# Patient Record
Sex: Male | Born: 1945 | Race: Black or African American | Hispanic: No | Marital: Single | Smoking: Current every day smoker
Health system: Southern US, Community
[De-identification: ages and names within clinical notes are randomized; demographics above are authoritative.]

## PROBLEM LIST (undated history)

## (undated) DIAGNOSIS — I10 Essential (primary) hypertension: Secondary | ICD-10-CM

## (undated) DIAGNOSIS — F32A Depression, unspecified: Secondary | ICD-10-CM

## (undated) DIAGNOSIS — F329 Major depressive disorder, single episode, unspecified: Secondary | ICD-10-CM

## (undated) DIAGNOSIS — E785 Hyperlipidemia, unspecified: Secondary | ICD-10-CM

## (undated) DIAGNOSIS — M109 Gout, unspecified: Secondary | ICD-10-CM

## (undated) HISTORY — DX: Depression, unspecified: F32.A

---
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
## 2006-01-09 ENCOUNTER — Emergency Department: Payer: Self-pay | Admitting: Emergency Medicine

## 2009-06-26 ENCOUNTER — Emergency Department: Payer: Self-pay | Admitting: Unknown Physician Specialty

## 2012-02-08 ENCOUNTER — Inpatient Hospital Stay: Payer: Self-pay | Admitting: Internal Medicine

## 2012-02-08 LAB — CBC
HCT: 35.4 % — ABNORMAL LOW (ref 40.0–52.0)
HGB: 11.8 g/dL — ABNORMAL LOW (ref 13.0–18.0)
MCHC: 33.4 g/dL (ref 32.0–36.0)
WBC: 12 10*3/uL — ABNORMAL HIGH (ref 3.8–10.6)

## 2012-02-08 LAB — CK: CK, Total: 2186 U/L — ABNORMAL HIGH (ref 35–232)

## 2012-02-08 LAB — PRO B NATRIURETIC PEPTIDE: B-Type Natriuretic Peptide: 425 pg/mL — ABNORMAL HIGH (ref 0–125)

## 2012-02-08 LAB — COMPREHENSIVE METABOLIC PANEL
Albumin: 2.7 g/dL — ABNORMAL LOW (ref 3.4–5.0)
Anion Gap: 7 (ref 7–16)
Bilirubin,Total: 1.5 mg/dL — ABNORMAL HIGH (ref 0.2–1.0)
Calcium, Total: 8.5 mg/dL (ref 8.5–10.1)
Chloride: 100 mmol/L (ref 98–107)
Co2: 32 mmol/L (ref 21–32)
EGFR (African American): >60
Glucose: 113 mg/dL — ABNORMAL HIGH (ref 65–99)
Osmolality: 279 (ref 275–301)
Potassium: 3.1 mmol/L — ABNORMAL LOW (ref 3.5–5.1)
SGOT(AST): 64 U/L — ABNORMAL HIGH (ref 15–37)

## 2012-02-09 LAB — CBC WITH DIFFERENTIAL/PLATELET
Basophil #: 0.1 10*3/uL (ref 0.0–0.1)
Basophil %: 0.7 %
Eosinophil #: 0 10*3/uL (ref 0.0–0.7)
HCT: 31.7 % — ABNORMAL LOW (ref 40.0–52.0)
Lymphocyte #: 1.6 10*3/uL (ref 1.0–3.6)
MCH: 29 pg (ref 26.0–34.0)
MCHC: 33.8 g/dL (ref 32.0–36.0)
MCV: 86 fL (ref 80–100)
Monocyte #: 1.6 x10 3/mm — ABNORMAL HIGH (ref 0.2–1.0)
Neutrophil #: 8.3 10*3/uL — ABNORMAL HIGH (ref 1.4–6.5)
RBC: 3.7 10*6/uL — ABNORMAL LOW (ref 4.40–5.90)
RDW: 13.8 % (ref 11.5–14.5)

## 2012-02-09 LAB — BASIC METABOLIC PANEL
Anion Gap: 8 (ref 7–16)
BUN: 11 mg/dL (ref 7–18)
Chloride: 104 mmol/L (ref 98–107)
Co2: 28 mmol/L (ref 21–32)
Creatinine: 0.92 mg/dL (ref 0.60–1.30)
EGFR (African American): >60
Sodium: 140 mmol/L (ref 136–145)

## 2012-02-09 LAB — URINALYSIS, COMPLETE
Bilirubin,UR: NEGATIVE
Hyaline Cast: 1
Ketone: NEGATIVE
Ph: 6 (ref 4.5–8.0)
Protein: 30
RBC,UR: 2 /HPF (ref 0–5)
Specific Gravity: 1.02 (ref 1.003–1.030)
Squamous Epithelial: <1
WBC UR: 1 /HPF (ref 0–5)

## 2012-02-10 LAB — CBC WITH DIFFERENTIAL/PLATELET
Basophil #: 0 10*3/uL (ref 0.0–0.1)
Basophil %: 0.3 %
Eosinophil #: 0 10*3/uL (ref 0.0–0.7)
HGB: 11.5 g/dL — ABNORMAL LOW (ref 13.0–18.0)
Lymphocyte #: 0.9 10*3/uL — ABNORMAL LOW (ref 1.0–3.6)
Lymphocyte %: 9.2 %
MCHC: 36.3 g/dL — ABNORMAL HIGH (ref 32.0–36.0)
Monocyte #: 0.7 x10 3/mm (ref 0.2–1.0)
Neutrophil %: 83.2 %
Platelet: 410 10*3/uL (ref 150–440)
RBC: 3.68 10*6/uL — ABNORMAL LOW (ref 4.40–5.90)

## 2012-02-10 LAB — BASIC METABOLIC PANEL
Anion Gap: 7 (ref 7–16)
Chloride: 105 mmol/L (ref 98–107)
Co2: 27 mmol/L (ref 21–32)
Creatinine: 0.89 mg/dL (ref 0.60–1.30)
EGFR (Non-African Amer.): >60
Glucose: 124 mg/dL — ABNORMAL HIGH (ref 65–99)
Osmolality: 279 (ref 275–301)

## 2012-02-14 LAB — CULTURE, BLOOD (SINGLE)

## 2014-06-10 NOTE — Consult Note (Signed)
Brief Consult Note: Diagnosis: gout left foot.   Patient was seen by consultant.   Consult note dictated.   Recommend to proceed with surgery or procedure.   Recommend further assessment or treatment.   Comments: Gout likely in left Sub talar joint.  I proceeded with injectiion to left STJ with 4mg  dwxamethasone and 20mg  triamcinalone mixed with marcaine. Sterile prep was usred.  Will check tomorrorw and see if pt is better.  May wasnt to consider a medrol dosepak for further coverage.  would recommend another uric after acute attack is over.  May need higher dose of allopurinol for reduced serum uric acid.  Electronic Signatures: Perry Mount (MD)  (Signed 19-Dec-13 14:29)  Authored: Brief Consult Note   Last Updated: 19-Dec-13 14:29 by Perry Mount (MD)

## 2014-06-10 NOTE — H&P (Signed)
PATIENT NAME:  Marc Smith, Marc Smith MR#:  [id number] DATE OF BIRTH:  Jan 21, 1946  DATE OF ADMISSION:  02/08/2012  CHIEF COMPLAINT: Bilateral lower extremity pain, left more than right, along with swelling of the left foot and inability to walk.   HISTORY OF PRESENTING ILLNESS: The patient is a 69 year old male patient with history of gout, hypertension, hyperlipidemia and questionable prior strokes, presents to the Emergency Room brought in by the sister with complaints of extreme weakness in the legs, inability to walk with left foot swelling and pain. The patient has had these symptoms for about three days. He has not had any fever. He complains of some dry cough which seems to be chronic, nonproductive. No shortness of breath, nausea, vomiting, abdominal pain, rash. No recent change in medications.   The patient in the Emergency Room has been found to have normal uric acid level, elevated white count of 12.1, with heart rate elevated to 110. He was given a dose of Ancef and is being admitted to the hospitalist service with left foot cellulitis, unable to walk. He has a left foot callus which is extremely painful. At baseline he walks with a walker and presently is unable to walk.   PAST MEDICAL HISTORY: Hypertension, gout, hyperlipidemia, possible stroke in the past with no residual symptoms, tobacco abuse.   SOCIAL HISTORY: The patient lives at home with sister. He walks with a walker. He drinks 1 to 2 drinks once or twice a week. He smokes a pack a day.   FAMILY HISTORY: Diabetes and hypertension in the family with leukemia in his father.   HOME MEDICATIONS: 1. Allopurinol 100 mg oral once a day.  2. Amlodipine 10 mg oral once a day.  3. Coreg 25 mg oral twice a day.  4. Hydrochlorothiazide 12.5 mg oral once a day.  5. Lisinopril 40 mg oral once a day.  6. Oxybutynin 5 mg oral 2 times a day.  7. Simvastatin 20 mg oral once a day.    REVIEW OF SYSTEMS:  CONSTITUTIONAL: Complains of fatigue and  weakness. No weight loss, weight gain.  EYES: No blurred vision, pain, or redness.  ENT: No tinnitus, ear pain, hearing loss.  RESPIRATORY: Has a chronic dry cough, no sputum. No shortness of breath, asthma, or chronic obstructive pulmonary disease.  CARDIOVASCULAR: No chest pain, orthopnea, edema or arrhythmias.  GASTROINTESTINAL: No nausea, vomiting, diarrhea, abdominal pain, hematemesis, melena.  GENITOURINARY: Has polyuria and frequency. No dysuria, hematuria or incontinence.  ENDOCRINE: Has polyuria and nocturia. No thyroid problems or increased sweating. HEMATOLOGIC/LYMPHATIC: No anemia, easy bruising, bleeding.  INTEGUMENTARY: Has some redness in the left foot with warmth. No rash or ulcers.  MUSCULOSKELETAL: Has extreme weakness in his lower extremities with left ankle and foot pain.  NEUROLOGICAL: Lower extremity weakness. No dysarthria or numbness.  PSYCHIATRIC: No anxiety or depression.   ALLERGIES: No known drug allergies.   PHYSICAL EXAMINATION: VITAL SIGNS: Temperature 98.1, pulse 110, respirations 18, blood pressure 180/93, saturating 98% on room air.  GENERAL: An obese African American male patient lying in bed, comfortable, drowsy.  PSYCHIATRIC: Alert, oriented to person, place, and time, conversational, poor judgment, depressed affect.  HEENT: Atraumatic, normocephalic. Oral mucosa dry and pink. External ears and nose normal. No pallor. No icterus. Pupils bilaterally are equal and reactive to light.  NECK: Supple. No thyromegaly. No palpable lymph nodes. Trachea midline. No carotid bruits or JVD.  CARDIOVASCULAR: S1, S2. Regular rate and rhythm without any murmurs. No edema.  RESPIRATORY: Normal  work of breathing. Clear to auscultation on both sides.  GASTROINTESTINAL: Soft abdomen, nontender. Bowel sounds present. No hepatosplenomegaly palpable.  SKIN: Warm and dry. No petechiae, rash, or ulcers.  MUSCULOSKELETAL: He has swelling of the left foot with warmth and  tenderness. No other joint swelling, redness noticed. Normal muscle tone.  NEUROLOGICAL: Motor strength 4 out of 5 in bilateral lower extremities. Cranial nerves II through XII are intact. Sensation to fine touch intact all over.   LABORATORY AND RADIOLOGICAL DATA:  Lab studies show glucose of 113, BNP 425, BUN 14, creatinine 1.11, potassium of 3.1, albumin 2.7. WBC 12, hemoglobin 11.8, platelets of 409.   EKG shows heart rate of 105, sinus tachycardia with left ventricular hypertrophy and repolarization changes. No acute ST-T wave changes.   ASSESSMENT AND PLAN: 1. Left foot cellulitis: We will start the patient on clindamycin. No risks of any multiresistant organisms. He has elevated white count, tachycardia and weakness. We will get blood cultures and follow.  2. Dehydration: Start on IV fluids. Could be secondary to decreased intake.  3. Weakness, inability to walk: Consult physical therapy. We will check a CK level to look for any rhabdomyolysis. I do not suspect a stroke in this patient considering the symmetrical weakness in lower extremities, and the patient does seem to have some baseline weakness in the lower extremities and uses a walker. This could be secondary to his cellulitis, infection.  4. Uncontrolled hypertension: Continue medications. I will hold hydrochlorothiazide secondary to his history of gout. We will start him on hydralazine in addition to his Coreg, amlodipine and lisinopril.  5. Hypokalemia: Likely secondary from the hydrochlorothiazide, decreased intake. We will replace and add potassium to his IV fluids. We will recheck in the morning.  6. Gout: The patient's uric acid level is normal. His left foot cellulitis might actually be gout. I will start him on ketorolac for pain, and the NSAIDs should help his gout. He needs to be followed.  7. Tobacco abuse- Patient counselled for > 3 minutes to quit smoking. He seems to have poor understanding and judgement. 8. Deep vein  prophylaxis with Lovenox.   CODE STATUS:  FULL CODE.     TIME SPENT: Time spent today on this case was 50 minutes with more than 50% time spent in coordination of care.   ____________________________ Leia Alf. Ashira Kirsten, MD srs:cb D: 02/08/2012 15:30:01 ET T: 02/08/2012 16:25:55 ET JOB#: [id number]  cc: Alveta Heimlich R. Yancy Knoble, MD, <Dictator> Neita Carp MD ELECTRONICALLY SIGNED 02/08/2012 17:00

## 2014-06-10 NOTE — Consult Note (Signed)
Brief Consult Note: Diagnosis: bilateral knee Baker's cyst, gout.   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 Hold off on injection with current infection. Rec antigout medication for knees.  Electronic Signatures: Laurene Footman (MD)  (Signed 19-Dec-13 18:04)  Authored: Brief Consult Note   Last Updated: 19-Dec-13 18:04 by Laurene Footman (MD)

## 2014-06-10 NOTE — Discharge Summary (Signed)
PATIENT NAME:  Marc Smith, Marc Smith MR#:  [id number] DATE OF BIRTH:  10-15-1945  DATE OF ADMISSION:  02/08/2012 DATE OF DISCHARGE:  02/11/2012   CONSULTATIONS:  1.  Podiatry, Gerrit Heck Troxler, DPM. 2.  Orthopedic surgery, Laurene Footman, MD.   DISCHARGE DIAGNOSES:  1.  Gout flare.  2.  Metabolic encephalopathy.  3.  Hypertension.  4.  Fossa cyst.   PROCEDURE: TJ.   CONDITION: Stable.   CODE STATUS: Full code.   MEDICATIONS:  1.  Oxybutynin 5 mg p.o. b.i.d. . 2.  Lisinopril 40 mg p.o. daily. 3.  Coreg 25 mg p.o. b.i.d.  4.  Norvasc 10 mg p.o. daily.  5.  Allopurinol 100 mg 2 tablets once a day.  6.  Lipitor 20 mg p.o. at bedtime  MEDICATION TO STOP:   1.  Zocor 20 mg p.o. at bedtime. 2.  Hydrochlorothiazide 12.5 mg p.o. daily.   DIET: Low sodium diet.   ACTIVITY: As tolerated.   FOLLOWUP:   1.  Followup with PCP within 1 to 2 weeks.  2.  Follow up with Dr. Elvina Mattes p.r.n.   REASON FOR ADMISSION: Bilateral lower extremity pain, left more than right, along with swelling of left foot and inability to walk.  HOSPITAL COURSE: The patient is a 69 year old African American male with a history of gout, hypertension, hyperlipidemia who presented to the ED with bilateral lower extremity weakness, pain, inability to walk.  For detailed history and physical examination, please refer to the admission note dictated by Dr. Darvin Neighbours. Laboratory data on admission date showed normal uric acid level. WBC 20.1. He was admitted for possible left of cellulitis, dehydration, weakness. After admission, the patient was treated with clindamycin for possible left foot cellulitis. We asked for a podiatry consult. Dr. Elvina Mattes evaluated the patient and suggested the patient had a gout flare.  He did an STJ injection, with 4 mg dexamethasone and 20 mg triamcinolone, mixed with Marcaine.  After treatment, the patient's symptoms have much improved. He had no complaints of pain.  In addition, the patient's  bilateral foot x-ray showed no acute bone disease. The patient had physical therapy and now he can walk with assistance. The patient's symptoms have much improved after injection treatment.  The patient has no sign of cellulitis, so clindamycin was discontinued. Dr. Elvina Mattes suggested he increase the allopurinol dose to 200 mg p.o. daily.   For weakness and inability to walk the patient underwent physical therapy.   Rhabdomyolysis. The patient's CK is elevated, so the patient has been treated with IV fluid and the CK level went down.  Uncontrolled hypertension. The patient has been treated with Coreg, amlodipine and lisinopril.  Hydrochlorothiazide was discontinued due to gout flare.   Patient also has hypokalemia, so hydrochlorothiazide was discontinued and the patient was treated with IV fluid.   Altered mental status has improved after treatment. He is alert, awake.  He is clinically stable and will be discharged to go home today with a rolling walker.       Discussed the patient's discharge plan with the patient, the patient's sister and the case manager.   TIME SPENT: About 38 minutes    ____________________________ Demetrios Loll, MD qc:eg D: 02/11/2012 14:44:01 ET T: 02/12/2012 20:46:04 ET JOB#: [id number]  cc: Demetrios Loll, MD, <Dictator> Demetrios Loll MD ELECTRONICALLY SIGNED 02/13/2012 16:48

## 2014-06-13 NOTE — Consult Note (Signed)
PATIENT NAME:  Marc Smith, Marc Smith MR#:  [id number] DATE OF BIRTH:  Dec 17, 1945  DATE OF CONSULTATION:  02/09/2012  REFERRING PHYSICIAN:   CONSULTING PHYSICIAN:  Marc Smith, DPM  HISTORY OF PRESENT ILLNESS: The patient was admitted yesterday to the hospital for swelling and pain particularly to his left foot and ankle region. He states that this pain has been going on since last Sunday and was starting to run a little fever and has had a good bit of pain and discomfort in the area. His right is a little sore also, but the left is notably worse. He has no history of injury to this that he is aware of.  PAST MEDICAL HISTORY: Positive for hyperlipidemia, possible stroke in the past with no residual symptoms, hypertension and gout. He states that he has had fluid drawn off of his joint before and tested for gout crystals and it was positive.  SOCIAL HISTORY: Tobacco abuse, EtOH periodically and smokes 1 pack per day.  FAMILY HISTORY: Diabetes, hypertension and his father had leukemia.  ALLERGIES: No known drug allergies.  CURRENT HOME MEDICATIONS: 1. Allopurinol 100 mg once a day. 2. Amlodipine 10 mg once a day. 3. Coreg 25 mg twice a day. 4. Hydrochlorothiazide 12.5 mg orally once a day. 5. Lisinopril 40 mg once a day.  6. Oxybutynin 5 mg twice a day. 7. Simvastatin 20 mg once a day.   PHYSICAL EXAMINATION:  VITAL SIGNS: Temperature is 99.1. He has run one of 100.6 earlier today and right now is 99.1. Pulse is 103, respirations 18, blood pressure 146/82 and pulse oxygen is 99%.   GENERAL: He is alert, fairly well oriented. Only distress is the pain in his left foot and ankle region. He is being followed by [facility] for his medical care.   LOWER EXTREMITY EXAM: Vascular - DP pulses are +1/4 bilaterally.   DERMATOLOGIC: Skin texture is within normal limits. The patient has sort of a painful sub-met 5 lesion callus on the left foot that appears to be hyperkeratotic in nature. He also  has Gryphotic, thickened and deformed toenails to his _____ which he does not take very good care of.   ORTHO: The patient has swelling, erythema and increased inflammation to the left foot as a whole. The right foot is not anything like this. There are inflammatory changes with a lot of tenderness to palpation to the anterior ankle, but even more so to the lateral subtalar joints, lateral sinus tarsi region.   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   CLINICAL IMPRESSION: I think this is likely gout even though his uric acid was high-normal. If he had a lot of it deposited in his joint at the time it would not reflect a high hyperuricemia. I think that it is something that would also make his white count go up some and cause him to have low-grade fevers as well, if he were having an acute attack. His uric acid was 6.7, but I think it is very reasonable that he does have a gout attack going on. Number one he has history of it and number two he takes hydrochlorothiazide daily and his allopurinol level is only 100 mg daily. BUN is 14 and his creatinine is 1.11. Clinical impression is likely gout attack, acute phase, in his left subtalar joint, more than likely.   TREATMENT PLAN: I went over different options.  I think what I would like to do is maybe try and give him a sinus tarsi injection of Marcaine and triamcinolone to see if that does not knock out a good bit of the discomfort, pain and irritation he has. I think that will probably help him out a good bit. He may need some Medrol Dosepak in order to knock out more of the inflammation as a whole in both feet. Would also recommend that once he has a normal appearance to his foot that we consider doing another serum uric acid at that juncture and see what his level is at that         point and if it is high recommend we go ahead and  increase his dosage of allopurinol. Since it is high-normal at this point, I think it is reasonable consideration anyway to try to get down to the lower levels to reduce risk of gout attacks.  ____________________________ Marc Heck Shalicia Craghead, DPM mgt:sb D: 02/09/2012 13:37:34 ET T: 02/09/2012 14:04:17 ET JOB#: [id number]  cc: Marc Heck Aster Eckrich, DPM, <Dictator> Perry Mount MD ELECTRONICALLY SIGNED 04/17/2012 14:28

## 2016-11-20 ENCOUNTER — Emergency Department: Payer: Medicare Other

## 2016-11-20 ENCOUNTER — Inpatient Hospital Stay
Admission: EM | Admit: 2016-11-20 | Discharge: 2016-11-23 | DRG: 689 | Disposition: A | Discharge status: Home / Self Care | Payer: Medicare Other | Attending: Internal Medicine | Admitting: Internal Medicine

## 2016-11-20 DIAGNOSIS — B9620 Unspecified Escherichia coli [E. coli] as the cause of diseases classified elsewhere: Secondary | ICD-10-CM | POA: Diagnosis present

## 2016-11-20 DIAGNOSIS — M109 Gout, unspecified: Secondary | ICD-10-CM | POA: Diagnosis present

## 2016-11-20 DIAGNOSIS — I10 Essential (primary) hypertension: Secondary | ICD-10-CM | POA: Diagnosis present

## 2016-11-20 DIAGNOSIS — N3000 Acute cystitis without hematuria: Secondary | ICD-10-CM | POA: Diagnosis not present

## 2016-11-20 DIAGNOSIS — G9341 Metabolic encephalopathy: Secondary | ICD-10-CM | POA: Diagnosis present

## 2016-11-20 DIAGNOSIS — E860 Dehydration: Secondary | ICD-10-CM | POA: Diagnosis present

## 2016-11-20 DIAGNOSIS — N390 Urinary tract infection, site not specified: Secondary | ICD-10-CM | POA: Diagnosis present

## 2016-11-20 DIAGNOSIS — N179 Acute kidney failure, unspecified: Secondary | ICD-10-CM | POA: Diagnosis present

## 2016-11-20 DIAGNOSIS — E876 Hypokalemia: Secondary | ICD-10-CM | POA: Diagnosis present

## 2016-11-20 DIAGNOSIS — E785 Hyperlipidemia, unspecified: Secondary | ICD-10-CM | POA: Diagnosis present

## 2016-11-20 DIAGNOSIS — F17210 Nicotine dependence, cigarettes, uncomplicated: Secondary | ICD-10-CM | POA: Diagnosis present

## 2016-11-20 DIAGNOSIS — R4182 Altered mental status, unspecified: Secondary | ICD-10-CM

## 2016-11-20 HISTORY — DX: Hyperlipidemia, unspecified: E78.5

## 2016-11-20 HISTORY — DX: Gout, unspecified: M10.9

## 2016-11-20 HISTORY — DX: Essential (primary) hypertension: I10

## 2016-11-20 LAB — HEPATIC FUNCTION PANEL
ALBUMIN: 2.7 g/dL — AB (ref 3.5–5.0)
ALT: 12 U/L — ABNORMAL LOW (ref 17–63)
AST: 25 U/L (ref 15–41)
Alkaline Phosphatase: 57 U/L (ref 38–126)
BILIRUBIN TOTAL: 1.8 mg/dL — AB (ref 0.3–1.2)
Bilirubin, Direct: 0.6 mg/dL — ABNORMAL HIGH (ref 0.1–0.5)
Indirect Bilirubin: 1.2 mg/dL — ABNORMAL HIGH (ref 0.3–0.9)
TOTAL PROTEIN: 7.5 g/dL (ref 6.5–8.1)

## 2016-11-20 LAB — TROPONIN I

## 2016-11-20 LAB — BASIC METABOLIC PANEL
Anion gap: 13 (ref 5–15)
BUN: 24 mg/dL — ABNORMAL HIGH (ref 6–20)
CO2: 27 mmol/L (ref 22–32)
CREATININE: 2.02 mg/dL — AB (ref 0.61–1.24)
Calcium: 8.1 mg/dL — ABNORMAL LOW (ref 8.9–10.3)
Chloride: 96 mmol/L — ABNORMAL LOW (ref 101–111)
GFR calc Af Amer: 37 mL/min — ABNORMAL LOW (ref >60)
GFR, EST NON AFRICAN AMERICAN: 32 mL/min — AB (ref >60)
Glucose, Bld: 150 mg/dL — ABNORMAL HIGH (ref 65–99)
Potassium: 3.1 mmol/L — ABNORMAL LOW (ref 3.5–5.1)
SODIUM: 136 mmol/L (ref 135–145)

## 2016-11-20 LAB — URINALYSIS, COMPLETE (UACMP) WITH MICROSCOPIC
BILIRUBIN URINE: NEGATIVE
Glucose, UA: NEGATIVE mg/dL
KETONES UR: NEGATIVE mg/dL
Nitrite: NEGATIVE
Protein, ur: 100 mg/dL — AB
Specific Gravity, Urine: 1.017 (ref 1.005–1.030)
pH: 5 (ref 5.0–8.0)

## 2016-11-20 LAB — CBC WITH DIFFERENTIAL/PLATELET
BASOS ABS: 0 10*3/uL (ref 0–0.1)
Basophils Relative: 0 %
EOS PCT: 0 %
Eosinophils Absolute: 0 10*3/uL (ref 0–0.7)
HCT: 33.8 % — ABNORMAL LOW (ref 40.0–52.0)
HEMOGLOBIN: 11.6 g/dL — AB (ref 13.0–18.0)
Lymphocytes Relative: 10 %
Lymphs Abs: 1.3 10*3/uL (ref 1.0–3.6)
MCH: 28.7 pg (ref 26.0–34.0)
MCHC: 34.3 g/dL (ref 32.0–36.0)
MCV: 83.5 fL (ref 80.0–100.0)
Monocytes Absolute: 1.8 10*3/uL — ABNORMAL HIGH (ref 0.2–1.0)
Monocytes Relative: 14 %
NEUTROS PCT: 76 %
Neutro Abs: 9.6 10*3/uL — ABNORMAL HIGH (ref 1.4–6.5)
PLATELETS: 319 10*3/uL (ref 150–440)
RBC: 4.04 MIL/uL — AB (ref 4.40–5.90)
RDW: 14.8 % — ABNORMAL HIGH (ref 11.5–14.5)
WBC: 12.7 10*3/uL — AB (ref 3.8–10.6)

## 2016-11-20 LAB — PREALBUMIN

## 2016-11-20 LAB — AMMONIA: AMMONIA: 43 umol/L — AB (ref 9–35)

## 2016-11-20 MED ORDER — CEFTRIAXONE SODIUM IN DEXTROSE 20 MG/ML IV SOLN
1.0000 g | Freq: Once | INTRAVENOUS | Status: AC
  Administered 2016-11-20: 1 g via INTRAVENOUS
  Filled 2016-11-20: qty 50

## 2016-11-20 MED ORDER — ONDANSETRON HCL 4 MG/2ML IJ SOLN: 4.0000 mg | Freq: Four times a day (QID) | INTRAMUSCULAR | Status: DC | PRN

## 2016-11-20 MED ORDER — DEXTROSE 5 % IV SOLN
1.0000 g | INTRAVENOUS | Status: DC
  Administered 2016-11-21 – 2016-11-22 (×2): 1 g via INTRAVENOUS
  Filled 2016-11-20 (×3): qty 10

## 2016-11-20 MED ORDER — SODIUM CHLORIDE 0.9 % IV SOLN
INTRAVENOUS | Status: DC
  Administered 2016-11-20 – 2016-11-23 (×4): via INTRAVENOUS

## 2016-11-20 MED ORDER — ACETAMINOPHEN 650 MG RE SUPP: 650.0000 mg | Freq: Four times a day (QID) | RECTAL | Status: DC | PRN

## 2016-11-20 MED ORDER — POLYETHYLENE GLYCOL 3350 17 G PO PACK: 17.0000 g | PACK | Freq: Every day | ORAL | Status: DC | PRN

## 2016-11-20 MED ORDER — ACETAMINOPHEN 325 MG PO TABS: 650.0000 mg | ORAL_TABLET | Freq: Four times a day (QID) | ORAL | Status: DC | PRN

## 2016-11-20 MED ORDER — ENOXAPARIN SODIUM 40 MG/0.4ML ~~LOC~~ SOLN
40.0000 mg | SUBCUTANEOUS | Status: DC
  Administered 2016-11-20 – 2016-11-23 (×3): 40 mg via SUBCUTANEOUS
  Filled 2016-11-20 (×3): qty 0.4

## 2016-11-20 MED ORDER — AMLODIPINE BESYLATE 5 MG PO TABS
5.0000 mg | ORAL_TABLET | Freq: Every day | ORAL | Status: DC
  Administered 2016-11-21: 5 mg via ORAL
  Filled 2016-11-20: qty 1

## 2016-11-20 MED ORDER — CARVEDILOL 3.125 MG PO TABS
3.1250 mg | ORAL_TABLET | Freq: Two times a day (BID) | ORAL | Status: DC
  Administered 2016-11-21 (×2): 3.125 mg via ORAL
  Filled 2016-11-20 (×2): qty 1

## 2016-11-20 MED ORDER — SODIUM CHLORIDE 0.9 % IV BOLUS (SEPSIS)
1000.0000 mL | Freq: Once | INTRAVENOUS | Status: AC
  Administered 2016-11-20: 1000 mL via INTRAVENOUS

## 2016-11-20 MED ORDER — ONDANSETRON HCL 4 MG PO TABS: 4.0000 mg | ORAL_TABLET | Freq: Four times a day (QID) | ORAL | Status: DC | PRN

## 2016-11-20 MED ORDER — POTASSIUM CHLORIDE CRYS ER 20 MEQ PO TBCR
40.0000 meq | EXTENDED_RELEASE_TABLET | Freq: Once | ORAL | Status: AC
  Administered 2016-11-20: 40 meq via ORAL
  Filled 2016-11-20: qty 2

## 2016-11-20 MED ORDER — ATORVASTATIN CALCIUM 20 MG PO TABS
40.0000 mg | ORAL_TABLET | Freq: Every day | ORAL | Status: DC
  Administered 2016-11-21 – 2016-11-22 (×2): 40 mg via ORAL
  Filled 2016-11-20 (×2): qty 2

## 2016-11-20 MED ORDER — ALLOPURINOL 100 MG PO TABS
50.0000 mg | ORAL_TABLET | Freq: Every day | ORAL | Status: DC
  Administered 2016-11-21: 50 mg via ORAL
  Filled 2016-11-20: qty 0.5

## 2016-11-20 NOTE — H&P (Signed)
PCP:   Lavera Guise, MD   Chief Complaint:  Decreased level of consciousness  HPI: This is a 71 year old male who lives at home with his niece. Today at approximately 2 PM he was sitting on the table eating dinner, his nephew came home and notes he was slumped over and difficult to arouse. His eyes were glassy and he was limp. He has some slurred speech. He was concerned and called 911.. Prior to this the patient was in his usual health, no fevers, no chills, no nausea, vomiting or diarrhea. He did complain of foot pain. He does have gout. History provided in a by the family present at bedside. His nephew Wilfred Lacy [phone].  Review of Systems:  The patient denies anorexia, fever, weight loss,, vision loss, decreased hearing, hoarseness, chest pain, syncope, dyspnea on exertion, peripheral edema, balance deficits, hemoptysis, abdominal pain, melena, hematochezia, severe indigestion/heartburn, hematuria, incontinence, genital sores, muscle weakness, suspicious skin lesions, transient blindness, difficulty walking, depression, unusual weight change, abnormal bleeding, enlarged lymph nodes, angioedema, and breast masses. lethargic  Past Medical History: No past medical history on file.: Hypertension, gout, dyslipidemia No past surgical history on file.: None  Medications: Prior to Admission medications   Allopurinol, atorvastatin, lisinopril, Norvasc, Coreg, colchicine     Allergies:  No Known Allergies  Social History: Positive for tobacco, negative for alcohol or illicit drugs  Family History: Coronary artery disease  Physical Exam: Vitals:   11/20/16 2045 11/20/16 2100 11/20/16 2115 11/20/16 2130  BP:  113/64  118/69  Pulse: 63 74 75 78  Resp: 18 (!) 23 (!) 25 20  Temp:      TempSrc:      SpO2: 98% 100% 100% 94%  Weight:      Height:        General:  Alert and oriented But lethargic, well developed and nourished, no acute distress Eyes: PERRLA, pink conjunctiva, no  scleral icterus ENT: Moist oral mucosa, neck supple, no thyromegaly Lungs: clear to ascultation, no wheeze, no crackles, no use of accessory muscles Cardiovascular: regular rate and rhythm, no regurgitation, no gallops, no murmurs. No carotid bruits, no JVD Abdomen: soft, positive BS, non-tender, non-distended, no organomegaly, not an acute abdomen GU: not examined Neuro: CN II - XII grossly intact, sensation intact Musculoskeletal: strength 5/5 all extremities, no clubbing, cyanosis or edema Skin: no rash, no subcutaneous crepitation, no decubitus Psych: Lethargic patient   Labs on Admission:   Recent Labs  11/20/16 1752  NA 136  K 3.1*  CL 96*  CO2 27  GLUCOSE 150*  BUN 24*  CREATININE 2.02*  CALCIUM 8.1*    Recent Labs  11/20/16 1752  AST 25  ALT 12*  ALKPHOS 57  BILITOT 1.8*  PROT 7.5  ALBUMIN 2.7*   No results for input(s): LIPASE, AMYLASE in the last 72 hours.  Recent Labs  11/20/16 1752  WBC 12.7*  NEUTROABS 9.6*  HGB 11.6*  HCT 33.8*  MCV 83.5  PLT 319    Recent Labs  11/20/16 1752  TROPONINI <0.03   Invalid input(s): POCBNP No results for input(s): DDIMER in the last 72 hours. No results for input(s): HGBA1C in the last 72 hours. No results for input(s): CHOL, HDL, LDLCALC, TRIG, CHOLHDL, LDLDIRECT in the last 72 hours. No results for input(s): TSH, T4TOTAL, T3FREE, THYROIDAB in the last 72 hours.  Invalid input(s): FREET3 No results for input(s): VITAMINB12, FOLATE, FERRITIN, TIBC, IRON, RETICCTPCT in the last 72 hours.  Micro Results: No results  found for this or any previous visit (from the past 240 hour(s)).   Radiological Exams on Admission: Ct Head Wo Contrast  Result Date: 11/20/2016 CLINICAL DATA:  Weakness, altered mental status and hypotension EXAM: CT HEAD WITHOUT CONTRAST TECHNIQUE: Contiguous axial images were obtained from the base of the skull through the vertex without intravenous contrast. COMPARISON:  None. FINDINGS:  Brain: No mass lesion, intraparenchymal hemorrhage or extra-axial collection. No evidence of acute cortical infarct. There is periventricular hypoattenuation compatible with chronic microvascular disease. Vascular: No hyperdense vessel or unexpected calcification. Skull: Normal visualized skull base, calvarium and extracranial soft tissues. Sinuses/Orbits: No sinus fluid levels or advanced mucosal thickening. No mastoid effusion. Normal orbits. 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 No acute abnormality. Electronically Signed   By: Ulyses Jarred M.D.   On: 11/20/2016 19:02   Dg Chest Port 1 View  Result Date: 11/20/2016 CLINICAL DATA:  Hypotension. EXAM: PORTABLE CHEST 1 VIEW COMPARISON:  02/08/2012. FINDINGS: Stable elevation of the left hemidiaphragm. Clear lungs. Normal sized heart. Unremarkable bones. IMPRESSION: No acute abnormality.  Stable elevated left hemidiaphragm. Electronically Signed   By: Claudie Revering M.D.   On: 11/20/2016 18:50   Dg Foot Complete Left  Result Date: 11/20/2016 CLINICAL DATA:  Gout. EXAM: LEFT FOOT - COMPLETE 3+ VIEW COMPARISON:  Radiographs of February 09, 2012. FINDINGS: There is no evidence of fracture or dislocation. Moderate hallux valgus deformity of the first metatarsophalangeal joint is noted. Vascular calcifications are noted. IMPRESSION: No acute abnormality seen in the left foot. Electronically Signed   By: Marijo Conception, M.D.   On: 11/20/2016 18:52    Assessment/Plan Present on Admission: . UTI (urinary tract infection) -Admit to MedSurg -Blood and urine cultures ordered -IV antibiotics Rocephin ordered  Hypokalemia -Replete in IV fluids  Acute kidney injury -IV fluid replacement. Repeat BMP in a.m. -Will hold ACE inhibitor  . Gout -Stable, home is resumed -Of note the family does not know what dosages medications the patient is on, they will bring the bottles in the morning -Will hold ACE  inhibitor  HTN -Stable, home is resumed  Dyslipidemia -Stable, home medications resumed   Marc Smith 11/20/2016, 11:14 PM

## 2016-11-20 NOTE — ED Provider Notes (Signed)
[facility] Emergency Department Provider Note  ____________________________________________   First MD Initiated Contact with Patient 11/20/16 1743     (approximate)  I have reviewed the triage vital signs and the nursing notes.   HISTORY  Chief Complaint No chief complaint on file.  level V exemption history Limited by the patient's clinical condition   HPI Marc Smith is a 71 y.o. male who comes to the emergency department via EMS after family called 911 for reported lethargy. The patient's only known past medical history of gout and at some point today the family noted that he had not been eating or drinking and not behaving normally they called 911. Further history is unable to be obtained as the patient is unable to speak.   ----------------------------------------- 6:16 PM on 11/20/2016 -----------------------------------------  Family is now at bedside able to provide some collateral history. They said the patient was in his usual state of health until about 2 days ago when he began to have a gouty flare to his left ankle. Yesterday he began to become weak and had difficulty ambulating although was conversant and appeared well. Today in the morning he appeared mostly well however after lunch the patient was no longer verbal and difficult to arouse. His nephew said "I think he is in a coma".   No past medical history on file.  There are no active problems to display for this patient.   No past surgical history on file.  Prior to Admission medications   Not on File    Allergies Patient has no known allergies.  No family history on file.  Social History Social History  Substance Use Topics  . Smoking status: Not on file  . Smokeless tobacco: Not on file  . Alcohol use Not on file    Review of Systems level V exemption history Limited by the patient's clinical  condition  ____________________________________________   PHYSICAL EXAM:  VITAL SIGNS: ED Triage Vitals  Enc Vitals Group     BP      Pulse      Resp      Temp      Temp src      SpO2      Weight      Height      Head Circumference      Peak Flow      Pain Score      Pain Loc      Pain Edu?      Excl. in [place]?     Constitutional: no acute distress. Obtunded but responsive to sternal rub and painful stimulus Eyes: PERRL EOMI. he pulls midrange and brisk Head: Atraumatic. Nose: No congestion/rhinnorhea. Mouth/Throat: No trismus Neck: No stridor.   Cardiovascular: Normal rate, regular rhythm. Grossly normal heart sounds.  Good peripheral circulation. Respiratory: Normal respiratory effort.  No retractions. Lungs CTAB and moving good air Gastrointestinal: soft nontender incontinent of feces Musculoskeletal: left foot swollen and slightly erythematous consistent with gout and not cellulitis no bulla blisters sloughing other signs of necrotizing soft tissue infection   Neurologic:  Normal speech and language. No gross focal neurologic deficits are appreciated. Skin:  Skin is warm, dry and intact. No rash noted. Psychiatric: Mood and affect are normal. Speech and behavior are normal.    ____________________________________________   DIFFERENTIAL includes but not limited to  metabolic arrangement, encephalopathy, sepsis, urinary tract infection, hyperammonemia, intracerebral hemorrhage ____________________________________________   LABS (all labs ordered are listed, but only abnormal results  are displayed)  Labs Reviewed  BASIC METABOLIC PANEL - Abnormal; Notable for the following:       Result Value   Potassium 3.1 (*)    Chloride 96 (*)    Glucose, Bld 150 (*)    BUN 24 (*)    Creatinine, Ser 2.02 (*)    Calcium 8.1 (*)    GFR calc non Af Amer 32 (*)    GFR calc Af Amer 37 (*)    All other components within normal limits  HEPATIC FUNCTION PANEL - Abnormal;  Notable for the following:    Albumin 2.7 (*)    ALT 12 (*)    Total Bilirubin 1.8 (*)    Bilirubin, Direct 0.6 (*)    Indirect Bilirubin 1.2 (*)    All other components within normal limits  CBC WITH DIFFERENTIAL/PLATELET - Abnormal; Notable for the following:    WBC 12.7 (*)    RBC 4.04 (*)    Hemoglobin 11.6 (*)    HCT 33.8 (*)    RDW 14.8 (*)    Neutro Abs 9.6 (*)    Monocytes Absolute 1.8 (*)    All other components within normal limits  URINALYSIS, COMPLETE (UACMP) WITH MICROSCOPIC - Abnormal; Notable for the following:    Color, Urine AMBER (*)    APPearance HAZY (*)    Hgb urine dipstick MODERATE (*)    Protein, ur 100 (*)    Leukocytes, UA TRACE (*)    Bacteria, UA MANY (*)    Squamous Epithelial / LPF 0-5 (*)    All other components within normal limits  AMMONIA - Abnormal; Notable for the following:    Ammonia 43 (*)    All other components within normal limits  URINE CULTURE  TROPONIN I  PREALBUMIN  AMMONIA    blood work reviewed and interpreted by me is consistent with urinary tract infection. Low albumin is either secondary to cirrhosis versus poor nutritional status __________________________________________  EKG   ____________________________________________  RADIOLOGY  chest x-ray reviewed by me shows no acute disease Foot x-ray reviewed by me shows no acute disease Head CT reviewed by me shows chronic changes but no acute disease ____________________________________________   PROCEDURES  Procedure(s) performed: no  Procedures  Critical Care performed: no  Observation: no ____________________________________________   INITIAL IMPRESSION / ASSESSMENT AND PLAN / ED COURSE  Pertinent labs & imaging results that were available during my care of the patient were reviewed by me and considered in my medical decision making (see chart for details).  Differential on the diaper dependent elderly dementia patient who is newly altered is  extremely broad but includes intracerebral pathology as well as infectious etiology. Head CT labs as well as urinalysis are all pending.     ----------------------------------------- 6:34 PM on 11/20/2016 -----------------------------------------  The patient's albumin just came back at 2.7 raising concern for possible undiagnosed cirrhosis. This could all be hepatic encephalopathy so we will check an ammonia now.____________________________________________   The patient's urinalysis was back in consistent with infection. I'll send a culture but she also requires intravenous antibiotics inpatient admission given his profound altered mental status.  FINAL CLINICAL IMPRESSION(S) / ED DIAGNOSES  Final diagnoses:  Acute cystitis without hematuria  Altered mental status, unspecified altered mental status type      NEW MEDICATIONS STARTED DURING THIS VISIT:  New Prescriptions   No medications on file     Note:  This document was prepared using Dragon voice recognition software and may include  unintentional dictation errors.     Darel Hong, MD 11/20/16 2159

## 2016-11-20 NOTE — ED Notes (Signed)
Pt in CT.

## 2016-11-20 NOTE — ED Triage Notes (Signed)
Pt presents to ED from home via EMS c/o weakness and low BP , systolic 52'C. Per EMS , gave 500 ml bag of sodium chloride

## 2016-11-21 LAB — URINE DRUG SCREEN, QUALITATIVE (ARMC ONLY)
AMPHETAMINES, UR SCREEN: NOT DETECTED
Barbiturates, Ur Screen: NOT DETECTED
Benzodiazepine, Ur Scrn: NOT DETECTED
COCAINE METABOLITE, UR ~~LOC~~: NOT DETECTED
Cannabinoid 50 Ng, Ur ~~LOC~~: NOT DETECTED
MDMA (ECSTASY) UR SCREEN: NOT DETECTED
Methadone Scn, Ur: NOT DETECTED
OPIATE, UR SCREEN: NOT DETECTED
PHENCYCLIDINE (PCP) UR S: NOT DETECTED
Tricyclic, Ur Screen: NOT DETECTED

## 2016-11-21 LAB — BASIC METABOLIC PANEL
ANION GAP: 12 (ref 5–15)
BUN: 33 mg/dL — ABNORMAL HIGH (ref 6–20)
CHLORIDE: 98 mmol/L — AB (ref 101–111)
CO2: 27 mmol/L (ref 22–32)
CREATININE: 1.84 mg/dL — AB (ref 0.61–1.24)
Calcium: 8.4 mg/dL — ABNORMAL LOW (ref 8.9–10.3)
GFR calc non Af Amer: 35 mL/min — ABNORMAL LOW (ref >60)
GFR, EST AFRICAN AMERICAN: 41 mL/min — AB (ref >60)
Glucose, Bld: 133 mg/dL — ABNORMAL HIGH (ref 65–99)
Potassium: 3.1 mmol/L — ABNORMAL LOW (ref 3.5–5.1)
Sodium: 137 mmol/L (ref 135–145)

## 2016-11-21 LAB — MAGNESIUM: Magnesium: 2.1 mg/dL (ref 1.7–2.4)

## 2016-11-21 LAB — CBC
HEMATOCRIT: 35.1 % — AB (ref 40.0–52.0)
HEMOGLOBIN: 11.9 g/dL — AB (ref 13.0–18.0)
MCH: 28.1 pg (ref 26.0–34.0)
MCHC: 33.8 g/dL (ref 32.0–36.0)
MCV: 83.2 fL (ref 80.0–100.0)
Platelets: 313 10*3/uL (ref 150–440)
RBC: 4.21 MIL/uL — AB (ref 4.40–5.90)
RDW: 14.6 % — ABNORMAL HIGH (ref 11.5–14.5)
WBC: 12.4 10*3/uL — ABNORMAL HIGH (ref 3.8–10.6)

## 2016-11-21 MED ORDER — ALLOPURINOL 100 MG PO TABS
100.0000 mg | ORAL_TABLET | Freq: Every day | ORAL | Status: DC
  Administered 2016-11-22 – 2016-11-23 (×2): 100 mg via ORAL
  Filled 2016-11-21 (×2): qty 1

## 2016-11-21 MED ORDER — AMLODIPINE BESYLATE 10 MG PO TABS: 10.0000 mg | ORAL_TABLET | Freq: Every day | ORAL | Status: DC

## 2016-11-21 MED ORDER — POTASSIUM CHLORIDE CRYS ER 20 MEQ PO TBCR
20.0000 meq | EXTENDED_RELEASE_TABLET | Freq: Once | ORAL | Status: AC
  Administered 2016-11-21: 20 meq via ORAL
  Filled 2016-11-21: qty 1

## 2016-11-21 NOTE — Progress Notes (Signed)
Spoke with patient's nephew to clarify history, etc. Patient at baseline is A&Ox4. Ambulates

## 2016-11-21 NOTE — Care Management (Signed)
Patient admitted from home after slumping over at the dinner table.  Admitted with UTI and acute renal failure.  Receiving IVFs, IV antibiotics.  Cultures thus far are negative.  Ammonia level slightly elevated.  Lives with his nephew and at baseline is independent I all his adls.

## 2016-11-21 NOTE — Progress Notes (Addendum)
[facility] NAME: Marc Smith    MR#:  [id number]  DATE OF BIRTH:  [birth year]  SUBJECTIVE:  CHIEF COMPLAINT:  No chief complaint on file. waking up and seem to be mentating fine, somewhat slow in response REVIEW OF SYSTEMS:  Review of Systems  Constitutional: Negative for chills, fever and weight loss.  HENT: Negative for nosebleeds and sore throat.   Eyes: Negative for blurred vision.  Respiratory: Negative for cough, shortness of breath and wheezing.   Cardiovascular: Negative for chest pain, orthopnea, leg swelling and PND.  Gastrointestinal: Negative for abdominal pain, constipation, diarrhea, heartburn, nausea and vomiting.  Genitourinary: Negative for dysuria and urgency.  Musculoskeletal: Negative for back pain.  Skin: Negative for rash.  Neurological: Negative for dizziness, speech change, focal weakness and headaches.  Endo/Heme/Allergies: Does not bruise/bleed easily.  Psychiatric/Behavioral: Negative for depression.    DRUG ALLERGIES:  No Known Allergies VITALS:  Blood pressure 131/76, pulse 63, temperature 97.7 F (36.5 C), temperature source Axillary, resp. rate 20, height 6\' 6"  (1.981 m), weight 90.1 kg (198 lb 9.6 oz), SpO2 98 %. PHYSICAL EXAMINATION:  Physical Exam  Constitutional: He is oriented to person, place, and time and well-developed, well-nourished, and in no distress.  HENT:  Head: Normocephalic and atraumatic.  Eyes: Pupils are equal, round, and reactive to light. Conjunctivae and EOM are normal.  Neck: Normal range of motion. Neck supple. No tracheal deviation present. No thyromegaly present.  Cardiovascular: Normal rate, regular rhythm and normal heart sounds.   Pulmonary/Chest: Effort normal and breath sounds normal. No respiratory distress. He has no wheezes. He exhibits no tenderness.  Abdominal: Soft. Bowel sounds are normal. He exhibits no distension. There is no tenderness.    Musculoskeletal: Normal range of motion.  Neurological: He is alert and oriented to person, place, and time. No cranial nerve deficit.  Somewhat slower in response   Skin: Skin is warm and dry. No rash noted.  Psychiatric: Mood and affect normal.   LABORATORY PANEL:  Male CBC  Recent Labs Lab 11/21/16 0007  WBC 12.4*  HGB 11.9*  HCT 35.1*  PLT 313   ------------------------------------------------------------------------------------------------------------------ Chemistries   Recent Labs Lab 11/20/16 1752 11/21/16 0007  NA 136 137  K 3.1* 3.1*  CL 96* 98*  CO2 27 27  GLUCOSE 150* 133*  BUN 24* 33*  CREATININE 2.02* 1.84*  CALCIUM 8.1* 8.4*  MG  --  2.1  AST 25  --   ALT 12*  --   ALKPHOS 57  --   BILITOT 1.8*  --    RADIOLOGY:  No results found. ASSESSMENT AND PLAN:   * Acute metabolic encephalopathy - unsure etio, likely multifactorial - Elevated ammonia, UTI, dehydration  . UTI (urinary tract infection) -based on UA -Blood and urine cultures pending -continue IV Rocephin for now.  * Hypokalemia -Replete and recheck  * Acute kidney injury -improving with IV hdyration  . Gout - stable on allopurinol and colchicine - on hold here  HTN -Stable, home is resumed  Dyslipidemia -Stable, home medications resumed     All the records are reviewed and case discussed with Care Management/Social Worker. Management plans discussed with the patient, family (nephew at bedside) and they are in agreement.  CODE STATUS: Full Code  TOTAL TIME TAKING CARE OF THIS PATIENT: 35 minutes.   More than 50% of the time was spent in counseling/coordination of care: YES  POSSIBLE D/C IN 1-2 DAYS, DEPENDING  ON CLINICAL CONDITION. And Neuro eval   [name] M.D on 11/21/2016 at 7:06 PM  Between 7am to 6pm - Pager - [phone]  After 6pm go to [email or web address] - password [email or web address]  [employer] [facility]  Office  [phone]  CC: Primary  care physician; Marc Guise, MD  Note: This dictation was prepared with Dragon dictation along with smaller phrase technology. Any transcriptional errors that result from this process are unintentional.

## 2016-11-22 DIAGNOSIS — G9341 Metabolic encephalopathy: Secondary | ICD-10-CM

## 2016-11-22 LAB — BASIC METABOLIC PANEL
ANION GAP: 6 (ref 5–15)
BUN: 29 mg/dL — ABNORMAL HIGH (ref 6–20)
CALCIUM: 8 mg/dL — AB (ref 8.9–10.3)
CO2: 29 mmol/L (ref 22–32)
Chloride: 105 mmol/L (ref 101–111)
Creatinine, Ser: 1.33 mg/dL — ABNORMAL HIGH (ref 0.61–1.24)
GFR calc Af Amer: >60 mL/min (ref >60)
GFR, EST NON AFRICAN AMERICAN: 53 mL/min — AB (ref >60)
GLUCOSE: 96 mg/dL (ref 65–99)
Potassium: 2.8 mmol/L — ABNORMAL LOW (ref 3.5–5.1)
Sodium: 140 mmol/L (ref 135–145)

## 2016-11-22 LAB — CBC
HCT: 31.7 % — ABNORMAL LOW (ref 40.0–52.0)
Hemoglobin: 10.9 g/dL — ABNORMAL LOW (ref 13.0–18.0)
MCH: 28.6 pg (ref 26.0–34.0)
MCHC: 34.4 g/dL (ref 32.0–36.0)
MCV: 83.1 fL (ref 80.0–100.0)
PLATELETS: 329 10*3/uL (ref 150–440)
RBC: 3.82 MIL/uL — ABNORMAL LOW (ref 4.40–5.90)
RDW: 14.6 % — AB (ref 11.5–14.5)
WBC: 8.2 10*3/uL (ref 3.8–10.6)

## 2016-11-22 LAB — MAGNESIUM: Magnesium: 2.1 mg/dL (ref 1.7–2.4)

## 2016-11-22 LAB — AMMONIA: AMMONIA: 24 umol/L (ref 9–35)

## 2016-11-22 MED ORDER — POTASSIUM CHLORIDE CRYS ER 20 MEQ PO TBCR
40.0000 meq | EXTENDED_RELEASE_TABLET | Freq: Once | ORAL | Status: AC
Start: 2016-11-22 — End: 2016-11-22
  Administered 2016-11-22: 40 meq via ORAL
  Filled 2016-11-22: qty 2

## 2016-11-22 MED ORDER — NICOTINE 21 MG/24HR TD PT24
21.0000 mg | MEDICATED_PATCH | Freq: Every day | TRANSDERMAL | Status: DC
  Administered 2016-11-22 – 2016-11-23 (×2): 21 mg via TRANSDERMAL
  Filled 2016-11-22 (×2): qty 1

## 2016-11-22 MED ORDER — POTASSIUM CHLORIDE CRYS ER 20 MEQ PO TBCR
40.0000 meq | EXTENDED_RELEASE_TABLET | Freq: Once | ORAL | Status: AC
  Administered 2016-11-22: 40 meq via ORAL
  Filled 2016-11-22: qty 2

## 2016-11-22 NOTE — Progress Notes (Signed)
[place] [facility] [place] NAME: Marc Smith    MR#:  [id number]  DATE OF BIRTH:  [birth year]  SUBJECTIVE:  CHIEF COMPLAINT:  No chief complaint on file. waking up and seem to be mentating fine, somewhat slow in response REVIEW OF SYSTEMS:  Review of Systems  Constitutional: Negative for chills, fever and weight loss.  HENT: Negative for nosebleeds and sore throat.   Eyes: Negative for blurred vision.  Respiratory: Negative for cough, shortness of breath and wheezing.   Cardiovascular: Negative for chest pain, orthopnea, leg swelling and PND.  Gastrointestinal: Negative for abdominal pain, constipation, diarrhea, heartburn, nausea and vomiting.  Genitourinary: Negative for dysuria and urgency.  Musculoskeletal: Negative for back pain.  Skin: Negative for rash.  Neurological: Negative for dizziness, speech change, focal weakness and headaches.  Endo/Heme/Allergies: Does not bruise/bleed easily.  Psychiatric/Behavioral: Negative for depression.    DRUG ALLERGIES:  No Known Allergies VITALS:  Blood pressure (!) 151/77, pulse 73, temperature (!) 97.5 F (36.4 C), temperature source Oral, resp. rate 16, height 6\' 6"  (1.981 m), weight 90.1 kg (198 lb 9.6 oz), SpO2 100 %. PHYSICAL EXAMINATION:  Physical Exam  Constitutional: He is oriented to person, place, and time and well-developed, well-nourished, and in no distress.  HENT:  Head: Normocephalic and atraumatic.  Eyes: Pupils are equal, round, and reactive to light. Conjunctivae and EOM are normal.  Neck: Normal range of motion. Neck supple. No tracheal deviation present. No thyromegaly present.  Cardiovascular: Normal rate, regular rhythm and normal heart sounds.   Pulmonary/Chest: Effort normal and breath sounds normal. No respiratory distress. He has no wheezes. He exhibits no tenderness.  Abdominal: Soft. Bowel sounds are normal. He exhibits no distension. There is no tenderness.    Musculoskeletal: Normal range of motion.  Neurological: He is alert and oriented to person, place, and time. No cranial nerve deficit.  Somewhat slower in response   Skin: Skin is warm and dry. No rash noted.  Psychiatric: Mood and affect normal.   LABORATORY PANEL:  Male CBC  Recent Labs Lab 11/22/16 0404  WBC 8.2  HGB 10.9*  HCT 31.7*  PLT 329   ------------------------------------------------------------------------------------------------------------------ Chemistries   Recent Labs Lab 11/20/16 1752  11/22/16 0404  NA 136  < > 140  K 3.1*  < > 2.8*  CL 96*  < > 105  CO2 27  < > 29  GLUCOSE 150*  < > 96  BUN 24*  < > 29*  CREATININE 2.02*  < > 1.33*  CALCIUM 8.1*  < > 8.0*  MG  --   < > 2.1  AST 25  --   --   ALT 12*  --   --   ALKPHOS 57  --   --   BILITOT 1.8*  --   --   < > = values in this interval not displayed. RADIOLOGY:  No results found. ASSESSMENT AND PLAN:   * Acute metabolic encephalopathy - unsure etio, likely multifactorial - Elevated ammonia, UTI, dehydration -Slowly improving  . E. coli UTI (urinary tract infection) -based on UA -urine cultures growing E. coli -continue IV Rocephin for now.  * Hypokalemia -Replete and recheck -Check magnesium  * Acute kidney injury -improving with IV hdyration -Creatinine 2.02->1.33  . Gout - stable on allopurinol and colchicine - on hold here  HTN -Stable  Dyslipidemia -Stable, home medications resumed     All the records are reviewed and case discussed with  Care Management/Social Worker. Management plans discussed with the patient, nursing and they are in agreement.  CODE STATUS: Full Code  TOTAL TIME TAKING CARE OF THIS PATIENT: 35 minutes.   More than 50% of the time was spent in counseling/coordination of care: YES  POSSIBLE D/C IN 1 DAYS, DEPENDING ON CLINICAL CONDITION.    [name] M.D on 11/22/2016 at 1:34 PM  Between 7am to 6pm - Pager - [phone]  After 6pm  go to [email or web address] - password [email or web address] [facility]  [employer] [place] [facility]  Office  [phone]  CC: Primary care physician; Lavera Guise, MD  Note: This dictation was prepared with Dragon dictation along with smaller phrase technology. Any transcriptional errors that result from this process are unintentional.

## 2016-11-22 NOTE — Consult Note (Signed)
Reason for Consult:AMS Referring Physician: Manuella Ghazi  CC: AMS  HPI: Marc Smith is an 71 y.o. male admitted 9/30 after being found poorly responsive by nephew.  Patient lives with family.  Nephew left patient at 2p and patient was at baseline.  Returned around 5p and found patient slumped over and difficult to arouse.  Speech was slurred.  EMS was called at that time.    Past Medical History:  Diagnosis Date  . Gout    Per Patient's nephew  . Hyperlipidemia    Patient's nephew  . Hypertension     History reviewed. No pertinent surgical history.  History reviewed. No pertinent family history.  Social History:  reports that he has been smoking Cigarettes.  He has been smoking about 1.00 pack per day. He uses smokeless tobacco. He reports that he does not drink alcohol or use drugs.  Has a history of ETOH abuse.    No Known Allergies  Medications:  I have reviewed the patient's current medications. Prior to Admission:  Prescriptions Prior to Admission  Medication Sig Dispense Refill Last Dose  . allopurinol (ZYLOPRIM) 100 MG tablet Take 100 mg by mouth daily.   11/20/2016 at 0800  . amLODipine (NORVASC) 10 MG tablet Take 10 mg by mouth daily.   11/20/2016 at 0800  . atorvastatin (LIPITOR) 40 MG tablet Take 40 mg by mouth daily.   11/20/2016 at 1800  . carvedilol (COREG) 25 MG tablet Take 25 mg by mouth 2 (two) times daily with a meal.   11/20/2016 at 0800  . colchicine 0.6 MG tablet Take 0.6 mg by mouth daily.   11/20/2016 at 0800  . lisinopril (PRINIVIL,ZESTRIL) 40 MG tablet Take 40 mg by mouth daily.   11/20/2016 at 0800   Scheduled: . allopurinol  100 mg Oral Daily  . atorvastatin  40 mg Oral q1800  . enoxaparin (LOVENOX) injection  40 mg Subcutaneous Q24H    ROS: History obtained from the patient  General ROS: negative for - chills, fatigue, fever, night sweats, weight gain or weight loss Psychological ROS: negative for - behavioral disorder, hallucinations, memory  difficulties, mood swings or suicidal ideation Ophthalmic ROS: negative for - blurry vision, double vision, eye pain or loss of vision ENT ROS: negative for - epistaxis, nasal discharge, oral lesions, sore throat, tinnitus or vertigo Allergy and Immunology ROS: negative for - hives or itchy/watery eyes Hematological and Lymphatic ROS: negative for - bleeding problems, bruising or swollen lymph nodes Endocrine ROS: negative for - galactorrhea, hair pattern changes, polydipsia/polyuria or temperature intolerance Respiratory ROS: negative for - cough, hemoptysis, shortness of breath or wheezing Cardiovascular ROS: negative for - chest pain, dyspnea on exertion, edema or irregular heartbeat Gastrointestinal ROS: negative for - abdominal pain, diarrhea, hematemesis, nausea/vomiting or stool incontinence Genito-Urinary ROS: negative for - dysuria, hematuria, incontinence or urinary frequency/urgency Musculoskeletal ROS: pain in feet due to gout Neurological ROS: as noted in HPI Dermatological ROS: negative for rash and skin lesion changes  Physical Examination: Blood pressure (!) 151/77, pulse 73, temperature (!) 97.5 F (36.4 C), temperature source Oral, resp. rate 16, height 6\' 6"  (1.981 m), weight 90.1 kg (198 lb 9.6 oz), SpO2 100 %.  HEENT-  Normocephalic, no lesions, without obvious abnormality.  Normal external eye and conjunctiva.  Normal TM's bilaterally.  Normal auditory canals and external ears. Normal external nose, mucus membranes and septum.  Normal pharynx. Cardiovascular- S1, S2 normal, pulses palpable throughout   Lungs- chest clear, no wheezing, rales, normal symmetric  air entry Abdomen- soft, non-tender; bowel sounds normal; no masses,  no organomegaly Extremities- mild LE edema Lymph-no adenopathy palpable Musculoskeletal-no joint tenderness, deformity or swelling Skin-warm and dry, no hyperpigmentation, vitiligo, or suspicious lesions  Neurological Examination   Mental  Status: Alert, oriented, thought content appropriate.  Speech fluent without evidence of aphasia.  Able to follow 3 step commands without difficulty. Cranial Nerves: II: Discs flat bilaterally; Visual fields grossly normal, pupils equal, round, reactive to light and accommodation III,IV, VI: ptosis not present, extra-ocular motions intact bilaterally V,VII: smile symmetric, facial light touch sensation normal bilaterally VIII: hearing normal bilaterally IX,X: gag reflex present XI: bilateral shoulder shrug XII: midline tongue extension Motor: Right : Upper extremity   5/5    Left:     Upper extremity   5/5  Lower extremity   5/5     Lower extremity   5/5 Tone and bulk:normal tone throughout; no atrophy noted Sensory: Pinprick and light touch intact throughout, bilaterally Deep Tendon Reflexes: 2+ and symmetric throughout Plantars: Not tested due to pain in feet Cerebellar: Normal finger-to-nose testing bilaterally Gait: not tested due to pain   Laboratory Studies:   Basic Metabolic Panel:  Recent Labs Lab 11/20/16 1752 11/21/16 0007 11/22/16 0404  NA 136 137 140  K 3.1* 3.1* 2.8*  CL 96* 98* 105  CO2 27 27 29   GLUCOSE 150* 133* 96  BUN 24* 33* 29*  CREATININE 2.02* 1.84* 1.33*  CALCIUM 8.1* 8.4* 8.0*  MG  --  2.1 2.1    Liver Function Tests:  Recent Labs Lab 11/20/16 1752  AST 25  ALT 12*  ALKPHOS 57  BILITOT 1.8*  PROT 7.5  ALBUMIN 2.7*   No results for input(s): LIPASE, AMYLASE in the last 168 hours.  Recent Labs Lab 11/20/16 2015 11/22/16 0404  AMMONIA 43* 24    CBC:  Recent Labs Lab 11/20/16 1752 11/21/16 0007 11/22/16 0404  WBC 12.7* 12.4* 8.2  NEUTROABS 9.6*  --   --   HGB 11.6* 11.9* 10.9*  HCT 33.8* 35.1* 31.7*  MCV 83.5 83.2 83.1  PLT 319 313 329    Cardiac Enzymes:  Recent Labs Lab 11/20/16 1752  TROPONINI <0.03    BNP: Invalid input(s): POCBNP  CBG: No results for input(s): GLUCAP in the last 168  hours.  Microbiology: Results for orders placed or performed during the hospital encounter of 11/20/16  Urine culture     Status: Abnormal (Preliminary result)   Collection Time: 11/20/16  6:35 PM  Result Value Ref Range Status   Specimen Description URINE, RANDOM  Final   Special Requests NONE  Final   Culture (A)  Final    >=100,000 COLONIES/mL ESCHERICHIA COLI SUSCEPTIBILITIES TO FOLLOW Performed at [facility], [address] [place], [place] [address]    Report Status PENDING  Incomplete  Culture, blood (Routine X 2) w Reflex to ID Panel     Status: None (Preliminary result)   Collection Time: 11/20/16 11:02 PM  Result Value Ref Range Status   Specimen Description BLOOD BLOOD LEFT ARM  Final   Special Requests   Final    BOTTLES DRAWN AEROBIC AND ANAEROBIC Blood Culture adequate volume   Culture NO GROWTH 2 DAYS  Final   Report Status PENDING  Incomplete  Culture, blood (Routine X 2) w Reflex to ID Panel     Status: None (Preliminary result)   Collection Time: 11/21/16 12:07 AM  Result Value Ref Range Status   Specimen  Description BLOOD LEFT HAND  Final   Special Requests   Final    BOTTLES DRAWN AEROBIC AND ANAEROBIC Blood Culture adequate volume   Culture NO GROWTH 1 DAY  Final   Report Status PENDING  Incomplete    Coagulation Studies: No results for input(s): LABPROT, INR in the last 72 hours.  Urinalysis:  Recent Labs Lab 11/20/16 [place] 1.017  PHURINE 5.0  GLUCOSEU NEGATIVE  HGBUR MODERATE*  BILIRUBINUR NEGATIVE  KETONESUR NEGATIVE  PROTEINUR 100*  NITRITE NEGATIVE  LEUKOCYTESUR TRACE*    Lipid Panel:  No results found for: CHOL, TRIG, HDL, CHOLHDL, VLDL, LDLCALC  HgbA1C:  Lab Results  Component Value Date   HGBA1C 5.5 02/08/2012    Urine Drug Screen:     Component Value Date/Time   LABOPIA NONE DETECTED 11/20/2016 [place] DETECTED 11/20/2016 1835   LABBENZ NONE DETECTED 11/20/2016 1835    AMPHETMU NONE DETECTED 11/20/2016 1835   THCU NONE DETECTED 11/20/2016 1835   LABBARB NONE DETECTED 11/20/2016 1835    Alcohol Level: No results for input(s): ETH in the last 168 hours.   Imaging: Ct Head Wo Contrast  Result Date: 11/20/2016 CLINICAL DATA:  Weakness, altered mental status and hypotension EXAM: CT HEAD WITHOUT CONTRAST TECHNIQUE: Contiguous axial images were obtained from the base of the skull through the vertex without intravenous contrast. COMPARISON:  None. FINDINGS: Brain: No mass lesion, intraparenchymal hemorrhage or extra-axial collection. No evidence of acute cortical infarct. There is periventricular hypoattenuation compatible with chronic microvascular disease. Vascular: No hyperdense vessel or unexpected calcification. Skull: Normal visualized skull base, calvarium and extracranial soft tissues. Sinuses/Orbits: No sinus fluid levels or advanced mucosal thickening. No mastoid effusion. Normal orbits. 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 No acute abnormality. Electronically Signed   By: Ulyses Jarred M.D.   On: 11/20/2016 19:02   Dg Chest Port 1 View  Result Date: 11/20/2016 CLINICAL DATA:  Hypotension. EXAM: PORTABLE CHEST 1 VIEW COMPARISON:  02/08/2012. FINDINGS: Stable elevation of the left hemidiaphragm. Clear lungs. Normal sized heart. Unremarkable bones. IMPRESSION: No acute abnormality.  Stable elevated left hemidiaphragm. Electronically Signed   By: Claudie Revering M.D.   On: 11/20/2016 18:50   Dg Foot Complete Left  Result Date: 11/20/2016 CLINICAL DATA:  Gout. EXAM: LEFT FOOT - COMPLETE 3+ VIEW COMPARISON:  Radiographs of February 09, 2012. FINDINGS: There is no evidence of fracture or dislocation. Moderate hallux valgus deformity of the first metatarsophalangeal joint is noted. Vascular calcifications are noted. IMPRESSION: No acute abnormality seen in the left foot. Electronically Signed   By: Marijo Conception, M.D.   On: 11/20/2016 18:52     Assessment/Plan: 71 year old male presenting altered.  Reportedly at baseline per family today.  On admission patient noted to have UTI, elevated wbc count, AKI and elevated ammonia.  Mental status likely multifactorial and related to all of these factors together.  Head CT reviewed and shows no acute changes although extensive chronic small vessel ischemic changes noted.  Patient improving and clinical status improving as well.   Recommendations: 1.  Agree with continued addressing of metabolic issues 2.  ASA 81mg  daily 3.  No further neurologic intervention is recommended at this time.  If further questions arise, please call or page at that time.  Thank you for allowing neurology to participate in the care of this patient.   Alexis Goodell, MD Neurology [phone] 11/22/2016, 11:00 AM

## 2016-11-23 LAB — CBC
HEMATOCRIT: 31.9 % — AB (ref 40.0–52.0)
HEMOGLOBIN: 10.8 g/dL — AB (ref 13.0–18.0)
MCH: 28.2 pg (ref 26.0–34.0)
MCHC: 33.8 g/dL (ref 32.0–36.0)
MCV: 83.4 fL (ref 80.0–100.0)
Platelets: 359 10*3/uL (ref 150–440)
RBC: 3.82 MIL/uL — ABNORMAL LOW (ref 4.40–5.90)
RDW: 14.6 % — ABNORMAL HIGH (ref 11.5–14.5)
WBC: 7.6 10*3/uL (ref 3.8–10.6)

## 2016-11-23 LAB — URINE CULTURE: Culture: >=100000 — AB

## 2016-11-23 LAB — BASIC METABOLIC PANEL
Anion gap: 8 (ref 5–15)
BUN: 13 mg/dL (ref 6–20)
CHLORIDE: 102 mmol/L (ref 101–111)
CO2: 29 mmol/L (ref 22–32)
CREATININE: 1.05 mg/dL (ref 0.61–1.24)
Calcium: 8.3 mg/dL — ABNORMAL LOW (ref 8.9–10.3)
GFR calc Af Amer: >60 mL/min (ref >60)
GFR calc non Af Amer: >60 mL/min (ref >60)
Glucose, Bld: 91 mg/dL (ref 65–99)
Potassium: 3 mmol/L — ABNORMAL LOW (ref 3.5–5.1)
Sodium: 139 mmol/L (ref 135–145)

## 2016-11-23 MED ORDER — POTASSIUM CHLORIDE CRYS ER 20 MEQ PO TBCR
40.0000 meq | EXTENDED_RELEASE_TABLET | ORAL | Status: AC
  Administered 2016-11-23: 40 meq via ORAL

## 2016-11-23 MED ORDER — CEPHALEXIN 500 MG PO CAPS
500.0000 mg | ORAL_CAPSULE | Freq: Every day | ORAL | Status: DC
  Administered 2016-11-23: 500 mg via ORAL
  Filled 2016-11-23: qty 1

## 2016-11-23 MED ORDER — CEPHALEXIN 500 MG PO CAPS: 500.0000 mg | ORAL_CAPSULE | Freq: Every day | ORAL | 0 refills | Status: DC

## 2016-11-23 MED ORDER — POTASSIUM CHLORIDE CRYS ER 20 MEQ PO TBCR
40.0000 meq | EXTENDED_RELEASE_TABLET | Freq: Once | ORAL | Status: AC
  Administered 2016-11-23: 40 meq via ORAL
  Filled 2016-11-23: qty 2

## 2016-11-23 MED ORDER — CEPHALEXIN 500 MG PO CAPS: 500.0000 mg | ORAL_CAPSULE | Freq: Two times a day (BID) | ORAL | Status: DC

## 2016-11-23 NOTE — Progress Notes (Signed)
Attempted to reach nephew at number provided on chart. Message left.

## 2016-11-23 NOTE — Discharge Summary (Signed)
[place] [facility] [place] NAME: Marc Smith    MR#:  [id number]  DATE OF BIRTH:  March 20, 1945  DATE OF ADMISSION:  11/20/2016   ADMITTING PHYSICIAN: Quintella Baton, MD  DATE OF DISCHARGE: 11/23/2016  7:00 PM  PRIMARY CARE PHYSICIAN: Lavera Guise, MD   ADMISSION DIAGNOSIS:  Acute cystitis without hematuria [N30.00] Altered mental status, unspecified altered mental status type [R41.82] DISCHARGE DIAGNOSIS:  Active Problems:   UTI (urinary tract infection)   Acute lower UTI   Gout   Essential hypertension  SECONDARY DIAGNOSIS:   Past Medical History:  Diagnosis Date  . Gout    Per Patient's nephew  . Hyperlipidemia    Patient's nephew  . Hypertension    HOSPITAL COURSE:   * Acute metabolic encephalopathy - likely multifactorial - Elevated ammonia, UTI, dehydration -back to baseline  . E. coli UTI (urinary tract infection) -treated with IV rocephin while in the Hospital and being D.Ced on Keflex  * Hypokalemia -Repleted and resolved  * Acute kidney injury -improving with IV hdyration -Creatinine 2.02->1.33->1.05  .Gout - stable on allopurinol and colchicine    DISCHARGE CONDITIONS:  stable CONSULTS OBTAINED:  Treatment Team:  Alexis Goodell, MD DRUG ALLERGIES:  No Known Allergies DISCHARGE MEDICATIONS:   Allergies as of 11/23/2016   No Known Allergies     Medication List    TAKE these medications   allopurinol 100 MG tablet Commonly known as:  ZYLOPRIM Take 100 mg by mouth daily.   amLODipine 10 MG tablet Commonly known as:  NORVASC Take 10 mg by mouth daily.   atorvastatin 40 MG tablet Commonly known as:  LIPITOR Take 40 mg by mouth daily.   carvedilol 25 MG tablet Commonly known as:  COREG Take 25 mg by mouth 2 (two) times daily with a meal.   cephALEXin 500 MG capsule Commonly known as:  KEFLEX Take 1 capsule (500 mg total) by mouth daily.   colchicine 0.6 MG tablet Take 0.6 mg by  mouth daily.   lisinopril 40 MG tablet Commonly known as:  PRINIVIL,ZESTRIL Take 40 mg by mouth daily.        DISCHARGE INSTRUCTIONS:   DIET:  Regular diet DISCHARGE CONDITION:  Good ACTIVITY:  Activity as tolerated OXYGEN:  Home Oxygen: No.  Oxygen Delivery: room air DISCHARGE LOCATION:  home   If you experience worsening of your admission symptoms, develop shortness of breath, life threatening emergency, suicidal or homicidal thoughts you must seek medical attention immediately by calling 911 or calling your MD immediately  if symptoms less severe.  You Must read complete instructions/literature along with all the possible adverse reactions/side effects for all the Medicines you take and that have been prescribed to you. Take any new Medicines after you have completely understood and accpet all the possible adverse reactions/side effects.   Please note  You were cared for by a hospitalist during your hospital stay. If you have any questions about your discharge medications or the care you received while you were in the hospital after you are discharged, you can call the unit and asked to speak with the hospitalist on call if the hospitalist that took care of you is not available. Once you are discharged, your primary care physician will handle any further medical issues. Please note that NO REFILLS for any discharge medications will be authorized once you are discharged, as it is imperative that you return to your primary care physician (or establish a  relationship with a primary care physician if you do not have one) for your aftercare needs so that they can reassess your need for medications and monitor your lab values.    On the day of Discharge:  VITAL SIGNS:  Blood pressure (!) 161/76, pulse 70, temperature 98.3 F (36.8 C), temperature source Oral, resp. rate 16, height 6\' 6"  (1.981 m), weight 90.1 kg (198 lb 9.6 oz), SpO2 100 %. PHYSICAL EXAMINATION:  GENERAL:  71  y.o.-year-old patient lying in the bed with no acute distress.  EYES: Pupils equal, round, reactive to light and accommodation. No scleral icterus. Extraocular muscles intact.  HEENT: Head atraumatic, normocephalic. Oropharynx and nasopharynx clear.  NECK:  Supple, no jugular venous distention. No thyroid enlargement, no tenderness.  LUNGS: Normal breath sounds bilaterally, no wheezing, rales,rhonchi or crepitation. No use of accessory muscles of respiration.  CARDIOVASCULAR: S1, S2 normal. No murmurs, rubs, or gallops.  ABDOMEN: Soft, non-tender, non-distended. Bowel sounds present. No organomegaly or mass.  EXTREMITIES: No pedal edema, cyanosis, or clubbing.  NEUROLOGIC: Cranial nerves II through XII are intact. Muscle strength 5/5 in all extremities. Sensation intact. Gait not checked.  PSYCHIATRIC: The patient is alert and oriented x 3.  SKIN: No obvious rash, lesion, or ulcer.  DATA REVIEW:   CBC  Recent Labs Lab 11/23/16 0437  WBC 7.6  HGB 10.8*  HCT 31.9*  PLT 359    Chemistries   Recent Labs Lab 11/20/16 1752  11/22/16 0404 11/23/16 0437  NA 136  < > 140 139  K 3.1*  < > 2.8* 3.0*  CL 96*  < > 105 102  CO2 27  < > 29 29  GLUCOSE 150*  < > 96 91  BUN 24*  < > 29* 13  CREATININE 2.02*  < > 1.33* 1.05  CALCIUM 8.1*  < > 8.0* 8.3*  MG  --   < > 2.1  --   AST 25  --   --   --   ALT 12*  --   --   --   ALKPHOS 57  --   --   --   BILITOT 1.8*  --   --   --   < > = values in this interval not displayed.   Follow-up Information    Lavera Guise, MD. Schedule an appointment as soon as possible for a visit in 1 week(s).   Specialty:  Internal Medicine Contact information: [place] [address] [phone]           Management plans discussed with the patient, family and they are in agreement.  CODE STATUS: Full Code   TOTAL TIME TAKING CARE OF THIS PATIENT: 45 minutes.    [name] M.D on 11/23/2016 at 8:23 PM  Between 7am to 6pm -  Pager - [phone]  After 6pm go to [email or web address] - password [email or web address] [facility]  [facility]  Office  [phone]  CC: Primary care physician; Lavera Guise, MD   Note: This dictation was prepared with Dragon dictation along with smaller phrase technology. Any transcriptional errors that result from this process are unintentional.

## 2016-11-23 NOTE — Progress Notes (Signed)
Patient discharged home with nephew. IV removed without complication and discharge instructions reviewed.

## 2016-11-23 NOTE — Care Management Important Message (Signed)
Important Message  Patient Details  Name: Marc Smith MRN: [id number] Date of Birth: 1945/09/12   Medicare Important Message Given:  Yes    Jolly Mango, RN 11/23/2016, 12:14 PM

## 2016-11-23 NOTE — Progress Notes (Signed)
Renal dose adjustment:  Order for cephalexin 500 mg PO daily was changed to 500 mg PO BID for renal function and indication. Per protocol.  Lenis Noon, PharmD 11/23/16 1:58 PM

## 2016-11-23 NOTE — Discharge Instructions (Signed)
Urinary Tract Infection, Adult °A urinary tract infection (UTI) is an infection of any part of the urinary tract. The urinary tract includes the: °· Kidneys. °· Ureters. °· Bladder. °· Urethra. ° °These organs make, store, and get rid of pee (urine) in the body. °Follow these instructions at home: °· Take over-the-counter and prescription medicines only as told by your doctor. °· If you were prescribed an antibiotic medicine, take it as told by your doctor. Do not stop taking the antibiotic even if you start to feel better. °· Avoid the following drinks: °? Alcohol. °? Caffeine. °? Tea. °? Carbonated drinks. °· Drink enough fluid to keep your pee clear or pale yellow. °· Keep all follow-up visits as told by your doctor. This is important. °· Make sure to: °? Empty your bladder often and completely. Do not to hold pee for long periods of time. °? Empty your bladder before and after sex. °? Wipe from front to back after a bowel movement if you are male. Use each tissue one time when you wipe. °Contact a doctor if: °· You have back pain. °· You have a fever. °· You feel sick to your stomach (nauseous). °· You throw up (vomit). °· Your symptoms do not get better after 3 days. °· Your symptoms go away and then come back. °Get help right away if: °· You have very bad back pain. °· You have very bad lower belly (abdominal) pain. °· You are throwing up and cannot keep down any medicines or water. °This information is not intended to replace advice given to you by your health care provider. Make sure you discuss any questions you have with your health care provider. °Document Released: 07/27/2007 Document Revised: 07/16/2015 Document Reviewed: 12/29/2014 °Elsevier Interactive Patient Education © 2018 [employer] ° °

## 2016-11-25 LAB — CULTURE, BLOOD (ROUTINE X 2)
CULTURE: NO GROWTH
SPECIAL REQUESTS: ADEQUATE

## 2016-11-26 LAB — CULTURE, BLOOD (ROUTINE X 2)
Culture: NO GROWTH
Special Requests: ADEQUATE

## 2017-01-19 ENCOUNTER — Ambulatory Visit: Payer: Self-pay | Admitting: Podiatry

## 2019-05-07 ENCOUNTER — Other Ambulatory Visit: Payer: Self-pay

## 2019-05-07 ENCOUNTER — Encounter: Payer: Self-pay | Admitting: Emergency Medicine

## 2019-05-07 ENCOUNTER — Emergency Department: Payer: Medicare Other

## 2019-05-07 ENCOUNTER — Inpatient Hospital Stay
Admission: EM | Admit: 2019-05-07 | Discharge: 2019-05-14 | DRG: 179 | Disposition: A | Discharge status: Skilled Nursing Facility | Payer: Medicare Other | Attending: Hospitalist | Admitting: Hospitalist

## 2019-05-07 ENCOUNTER — Observation Stay: Payer: Medicare Other

## 2019-05-07 DIAGNOSIS — R32 Unspecified urinary incontinence: Secondary | ICD-10-CM | POA: Diagnosis present

## 2019-05-07 DIAGNOSIS — R54 Age-related physical debility: Secondary | ICD-10-CM | POA: Diagnosis present

## 2019-05-07 DIAGNOSIS — R159 Full incontinence of feces: Secondary | ICD-10-CM | POA: Diagnosis present

## 2019-05-07 DIAGNOSIS — D72829 Elevated white blood cell count, unspecified: Secondary | ICD-10-CM | POA: Insufficient documentation

## 2019-05-07 DIAGNOSIS — K6389 Other specified diseases of intestine: Secondary | ICD-10-CM | POA: Diagnosis present

## 2019-05-07 DIAGNOSIS — J690 Pneumonitis due to inhalation of food and vomit: Secondary | ICD-10-CM | POA: Diagnosis not present

## 2019-05-07 DIAGNOSIS — R609 Edema, unspecified: Secondary | ICD-10-CM

## 2019-05-07 DIAGNOSIS — D75839 Thrombocytosis, unspecified: Secondary | ICD-10-CM | POA: Diagnosis present

## 2019-05-07 DIAGNOSIS — J189 Pneumonia, unspecified organism: Secondary | ICD-10-CM

## 2019-05-07 DIAGNOSIS — Z20822 Contact with and (suspected) exposure to covid-19: Secondary | ICD-10-CM | POA: Diagnosis present

## 2019-05-07 DIAGNOSIS — M10071 Idiopathic gout, right ankle and foot: Secondary | ICD-10-CM

## 2019-05-07 DIAGNOSIS — Z79899 Other long term (current) drug therapy: Secondary | ICD-10-CM

## 2019-05-07 DIAGNOSIS — D649 Anemia, unspecified: Secondary | ICD-10-CM | POA: Diagnosis present

## 2019-05-07 DIAGNOSIS — R9389 Abnormal findings on diagnostic imaging of other specified body structures: Secondary | ICD-10-CM

## 2019-05-07 DIAGNOSIS — E785 Hyperlipidemia, unspecified: Secondary | ICD-10-CM | POA: Diagnosis present

## 2019-05-07 DIAGNOSIS — M109 Gout, unspecified: Secondary | ICD-10-CM | POA: Diagnosis present

## 2019-05-07 DIAGNOSIS — R778 Other specified abnormalities of plasma proteins: Secondary | ICD-10-CM | POA: Diagnosis not present

## 2019-05-07 DIAGNOSIS — D473 Essential (hemorrhagic) thrombocythemia: Secondary | ICD-10-CM | POA: Diagnosis not present

## 2019-05-07 DIAGNOSIS — Z87891 Personal history of nicotine dependence: Secondary | ICD-10-CM

## 2019-05-07 DIAGNOSIS — M79604 Pain in right leg: Secondary | ICD-10-CM | POA: Diagnosis present

## 2019-05-07 DIAGNOSIS — R7989 Other specified abnormal findings of blood chemistry: Secondary | ICD-10-CM

## 2019-05-07 DIAGNOSIS — R000 Tachycardia, unspecified: Secondary | ICD-10-CM

## 2019-05-07 DIAGNOSIS — K5641 Fecal impaction: Secondary | ICD-10-CM

## 2019-05-07 DIAGNOSIS — F17210 Nicotine dependence, cigarettes, uncomplicated: Secondary | ICD-10-CM | POA: Diagnosis present

## 2019-05-07 DIAGNOSIS — I071 Rheumatic tricuspid insufficiency: Secondary | ICD-10-CM | POA: Diagnosis present

## 2019-05-07 DIAGNOSIS — I10 Essential (primary) hypertension: Secondary | ICD-10-CM | POA: Diagnosis present

## 2019-05-07 LAB — COMPREHENSIVE METABOLIC PANEL
ALT: 12 U/L (ref 0–44)
AST: 22 U/L (ref 15–41)
Albumin: 3.6 g/dL (ref 3.5–5.0)
Alkaline Phosphatase: 68 U/L (ref 38–126)
Anion gap: 15 (ref 5–15)
BUN: 16 mg/dL (ref 8–23)
CO2: 29 mmol/L (ref 22–32)
Calcium: 9.3 mg/dL (ref 8.9–10.3)
Chloride: 95 mmol/L — ABNORMAL LOW (ref 98–111)
Creatinine, Ser: 1.09 mg/dL (ref 0.61–1.24)
GFR calc Af Amer: >60 mL/min (ref >60)
GFR calc non Af Amer: >60 mL/min (ref >60)
Glucose, Bld: 118 mg/dL — ABNORMAL HIGH (ref 70–99)
Potassium: 3.4 mmol/L — ABNORMAL LOW (ref 3.5–5.1)
Sodium: 139 mmol/L (ref 135–145)
Total Bilirubin: 1.6 mg/dL — ABNORMAL HIGH (ref 0.3–1.2)
Total Protein: 9 g/dL — ABNORMAL HIGH (ref 6.5–8.1)

## 2019-05-07 LAB — RESPIRATORY PANEL BY RT PCR (FLU A&B, COVID)
Influenza A by PCR: NEGATIVE
Influenza B by PCR: NEGATIVE
SARS Coronavirus 2 by RT PCR: NEGATIVE

## 2019-05-07 LAB — PROTIME-INR
INR: 1.1 (ref 0.8–1.2)
Prothrombin Time: 14.3 seconds (ref 11.4–15.2)

## 2019-05-07 LAB — CBC WITH DIFFERENTIAL/PLATELET
Abs Immature Granulocytes: 0.05 10*3/uL (ref 0.00–0.07)
Abs Immature Granulocytes: 0.06 10*3/uL (ref 0.00–0.07)
Basophils Absolute: 0.1 10*3/uL (ref 0.0–0.1)
Basophils Absolute: 0 10*3/uL (ref 0.0–0.1)
Basophils Relative: 0 %
Basophils Relative: 0 %
Eosinophils Absolute: 0 10*3/uL (ref 0.0–0.5)
Eosinophils Absolute: 0 10*3/uL (ref 0.0–0.5)
Eosinophils Relative: 0 %
Eosinophils Relative: 0 %
HCT: 42.5 % (ref 39.0–52.0)
HCT: 32.9 % — ABNORMAL LOW (ref 39.0–52.0)
Hemoglobin: 14 g/dL (ref 13.0–17.0)
Hemoglobin: 10.9 g/dL — ABNORMAL LOW (ref 13.0–17.0)
Immature Granulocytes: 0 %
Immature Granulocytes: 0 %
Lymphocytes Relative: 9 %
Lymphocytes Relative: 10 %
Lymphs Abs: 1.3 10*3/uL (ref 0.7–4.0)
Lymphs Abs: 1.5 10*3/uL (ref 0.7–4.0)
MCH: 27.9 pg (ref 26.0–34.0)
MCH: 28.2 pg (ref 26.0–34.0)
MCHC: 32.9 g/dL (ref 30.0–36.0)
MCHC: 33.1 g/dL (ref 30.0–36.0)
MCV: 84.8 fL (ref 80.0–100.0)
MCV: 85 fL (ref 80.0–100.0)
Monocytes Absolute: 1.5 10*3/uL — ABNORMAL HIGH (ref 0.1–1.0)
Monocytes Absolute: 1.8 10*3/uL — ABNORMAL HIGH (ref 0.1–1.0)
Monocytes Relative: 10 %
Monocytes Relative: 12 %
Neutro Abs: 12.1 10*3/uL — ABNORMAL HIGH (ref 1.7–7.7)
Neutro Abs: 12 10*3/uL — ABNORMAL HIGH (ref 1.7–7.7)
Neutrophils Relative %: 81 %
Neutrophils Relative %: 78 %
Platelets: 966 10*3/uL (ref 150–400)
Platelets: 797 10*3/uL — ABNORMAL HIGH (ref 150–400)
RBC: 5.01 MIL/uL (ref 4.22–5.81)
RBC: 3.87 MIL/uL — ABNORMAL LOW (ref 4.22–5.81)
RDW: 14.5 % (ref 11.5–15.5)
RDW: 14.3 % (ref 11.5–15.5)
WBC: 15 10*3/uL — ABNORMAL HIGH (ref 4.0–10.5)
WBC: 15.4 10*3/uL — ABNORMAL HIGH (ref 4.0–10.5)
nRBC: 0 % (ref 0.0–0.2)
nRBC: 0 % (ref 0.0–0.2)

## 2019-05-07 LAB — TROPONIN I (HIGH SENSITIVITY)
Troponin I (High Sensitivity): 108 ng/L (ref <18)
Troponin I (High Sensitivity): 340 ng/L (ref <18)
Troponin I (High Sensitivity): 769 ng/L (ref <18)

## 2019-05-07 LAB — LACTIC ACID, PLASMA
Lactic Acid, Venous: 2.2 mmol/L (ref 0.5–1.9)
Lactic Acid, Venous: 2.3 mmol/L (ref 0.5–1.9)

## 2019-05-07 LAB — APTT: aPTT: 38 seconds — ABNORMAL HIGH (ref 24–36)

## 2019-05-07 MED ORDER — HEPARIN BOLUS VIA INFUSION
4000.0000 [IU] | Freq: Once | INTRAVENOUS | Status: AC
  Administered 2019-05-07: 16:00:00 4000 [IU] via INTRAVENOUS
  Filled 2019-05-07: qty 4000

## 2019-05-07 MED ORDER — ACETAMINOPHEN 650 MG RE SUPP: 650.0000 mg | Freq: Four times a day (QID) | RECTAL | Status: DC | PRN

## 2019-05-07 MED ORDER — LEVOFLOXACIN IN D5W 750 MG/150ML IV SOLN
750.0000 mg | Freq: Once | INTRAVENOUS | Status: AC
Start: 2019-05-07 — End: 2019-05-07
  Administered 2019-05-07: 14:00:00 750 mg via INTRAVENOUS
  Filled 2019-05-07: qty 150

## 2019-05-07 MED ORDER — COLCHICINE 0.6 MG PO TABS
0.6000 mg | ORAL_TABLET | Freq: Once | ORAL | Status: AC
  Administered 2019-05-07: 0.6 mg via ORAL
  Filled 2019-05-07 (×2): qty 1

## 2019-05-07 MED ORDER — POTASSIUM CHLORIDE 10 MEQ/100ML IV SOLN
10.0000 meq | INTRAVENOUS | Status: AC
  Administered 2019-05-07 – 2019-05-08 (×4): 10 meq via INTRAVENOUS
  Filled 2019-05-07 (×4): qty 100

## 2019-05-07 MED ORDER — OXYCODONE HCL 5 MG PO TABS: 5.0000 mg | ORAL_TABLET | ORAL | Status: DC | PRN

## 2019-05-07 MED ORDER — ASPIRIN 81 MG PO CHEW
81.0000 mg | CHEWABLE_TABLET | Freq: Every day | ORAL | Status: DC
  Administered 2019-05-08 – 2019-05-14 (×7): 81 mg via ORAL
  Filled 2019-05-07 (×8): qty 1

## 2019-05-07 MED ORDER — ALLOPURINOL 100 MG PO TABS
100.0000 mg | ORAL_TABLET | Freq: Every day | ORAL | Status: DC
  Administered 2019-05-08 – 2019-05-14 (×6): 100 mg via ORAL
  Filled 2019-05-07 (×8): qty 1

## 2019-05-07 MED ORDER — METOPROLOL TARTRATE 5 MG/5ML IV SOLN
5.0000 mg | Freq: Once | INTRAVENOUS | Status: AC
  Administered 2019-05-07: 16:00:00 5 mg via INTRAVENOUS
  Filled 2019-05-07: qty 5

## 2019-05-07 MED ORDER — SODIUM CHLORIDE 0.9 % IV SOLN: Freq: Once | INTRAVENOUS | Status: AC

## 2019-05-07 MED ORDER — SODIUM CHLORIDE 0.9 % IV SOLN
3.0000 g | Freq: Four times a day (QID) | INTRAVENOUS | Status: DC
  Administered 2019-05-07 – 2019-05-08 (×4): 3 g via INTRAVENOUS
  Filled 2019-05-07 (×2): qty 8
  Filled 2019-05-07: qty 3
  Filled 2019-05-07 (×5): qty 8

## 2019-05-07 MED ORDER — AMLODIPINE BESYLATE 10 MG PO TABS
10.0000 mg | ORAL_TABLET | Freq: Every day | ORAL | Status: DC
  Administered 2019-05-07 – 2019-05-14 (×8): 10 mg via ORAL
  Filled 2019-05-07 (×6): qty 1
  Filled 2019-05-07: qty 2
  Filled 2019-05-07: qty 1

## 2019-05-07 MED ORDER — ASPIRIN 81 MG PO CHEW
324.0000 mg | CHEWABLE_TABLET | Freq: Once | ORAL | Status: AC
  Administered 2019-05-07: 14:00:00 324 mg via ORAL
  Filled 2019-05-07: qty 4

## 2019-05-07 MED ORDER — COLCHICINE 0.6 MG PO TABS
0.6000 mg | ORAL_TABLET | Freq: Every day | ORAL | Status: DC
  Administered 2019-05-08 – 2019-05-14 (×7): 0.6 mg via ORAL
  Filled 2019-05-07 (×8): qty 1

## 2019-05-07 MED ORDER — ACETAMINOPHEN 325 MG PO TABS: 650.0000 mg | ORAL_TABLET | Freq: Four times a day (QID) | ORAL | Status: DC | PRN

## 2019-05-07 MED ORDER — LACTATED RINGERS IV BOLUS
1000.0000 mL | Freq: Once | INTRAVENOUS | Status: AC
  Administered 2019-05-07: 1000 mL via INTRAVENOUS

## 2019-05-07 MED ORDER — COLCHICINE 0.6 MG PO TABS
1.2000 mg | ORAL_TABLET | Freq: Once | ORAL | Status: AC
  Administered 2019-05-07: 1.2 mg via ORAL
  Filled 2019-05-07 (×2): qty 2

## 2019-05-07 MED ORDER — ATORVASTATIN CALCIUM 20 MG PO TABS
40.0000 mg | ORAL_TABLET | Freq: Every day | ORAL | Status: DC
  Administered 2019-05-07 – 2019-05-14 (×8): 40 mg via ORAL
  Filled 2019-05-07 (×8): qty 2

## 2019-05-07 MED ORDER — HEPARIN (PORCINE) 25000 UT/250ML-% IV SOLN
1700.0000 [IU]/h | INTRAVENOUS | Status: DC
  Administered 2019-05-07: 1500 [IU]/h via INTRAVENOUS
  Administered 2019-05-08: 1700 [IU]/h via INTRAVENOUS
  Filled 2019-05-07 (×2): qty 250

## 2019-05-07 MED ORDER — IOHEXOL 350 MG/ML SOLN
75.0000 mL | Freq: Once | INTRAVENOUS | Status: AC | PRN
  Administered 2019-05-07: 75 mL via INTRAVENOUS

## 2019-05-07 MED ORDER — LACTATED RINGERS IV SOLN: INTRAVENOUS | Status: DC

## 2019-05-07 MED ORDER — CARVEDILOL 25 MG PO TABS
25.0000 mg | ORAL_TABLET | Freq: Two times a day (BID) | ORAL | Status: DC
  Administered 2019-05-08 – 2019-05-14 (×13): 25 mg via ORAL
  Filled 2019-05-07 (×13): qty 1

## 2019-05-07 NOTE — ED Notes (Signed)
Date and time results received: 05/07/19 12:29 PM  (use smartphrase ".now" to insert current time)  Test: Troponin Critical Value: 108  Name of Provider Notified: Dr. Jimmye Norman   Orders Received? Or Actions Taken?: Dr. Jimmye Norman

## 2019-05-07 NOTE — ED Notes (Signed)
This RN and Arby Barrette, RN at bedside. Pt with soiled brief and linens. Pt's sheets changed, pt cleaned and clean/dry brief placed on pt.

## 2019-05-07 NOTE — H&P (Signed)
History and Physical    Marc Smith [id number] DOB: May 15, 1945 DOA: 05/07/2019  PCP: Lavera Guise, MD  Patient coming from: home  I have personally briefly reviewed patient's old medical records in [place]  Chief Complaint: R ankle pain  HPI: Marc Smith is Marc Smith 74 y.o. male with medical history significant of HTN, gout, HLD presentine with RLE pain.  History is obtained with assistance from nephew and chart.  Patient notes he's had R ankle pain for about Birda Didonato week.  Suspects it's due to gout.  Describes just as pain.  No other complaints.  No fevers, chest pain, SOB.  Nephew notes he's been sitting in chair for Donelle Baba few days, but couldn't get up due to pain so they brought him to hospital.   ED Course: EKG, imaging, heparin gtt, abx, cardiology c/s.  Admit for pneumonia, elevated troponin, and RLE pain.    Review of Systems: As per HPI otherwise 10 point review of systems negative.    Past Medical History:  Diagnosis Date  . Gout    Per Patient's nephew  . Hyperlipidemia    Patient's nephew  . Hypertension     History reviewed. No pertinent surgical history.   reports that he has been smoking cigarettes. He has been smoking about 1.00 pack per day. He uses smokeless tobacco. He reports that he does not drink alcohol or use drugs.  No Known Allergies  No family history on file.  Prior to Admission medications   Medication Sig Start Date End Date Taking? Authorizing Provider  allopurinol (ZYLOPRIM) 100 MG tablet Take 100 mg by mouth daily.    [provider]  amLODipine (NORVASC) 10 MG tablet Take 10 mg by mouth daily.    [provider]  atorvastatin (LIPITOR) 40 MG tablet Take 40 mg by mouth daily.    [provider]  carvedilol (COREG) 25 MG tablet Take 25 mg by mouth 2 (two) times daily with Muaaz Brau meal.    [provider]  colchicine 0.6 MG tablet Take 0.6 mg by mouth daily.    [provider]  lisinopril  (PRINIVIL,ZESTRIL) 40 MG tablet Take 40 mg by mouth daily.    [provider]  sertraline (ZOLOFT) 50 MG tablet Take 50 mg by mouth daily.    [provider]    Physical Exam: Vitals:   05/07/19 1900 05/07/19 1930 05/07/19 2000 05/07/19 2030  BP: 133/79 139/80 (!) 153/95 139/76  Pulse: 93 92 94 96  Resp: (!) 23 (!) 27 20 (!) 26  Temp:      TempSrc:      SpO2: 97% 97% 98% 94%  Weight:      Height:        Constitutional: NAD, calm, comfortable Vitals:   05/07/19 1900 05/07/19 1930 05/07/19 2000 05/07/19 2030  BP: 133/79 139/80 (!) 153/95 139/76  Pulse: 93 92 94 96  Resp: (!) 23 (!) 27 20 (!) 26  Temp:      TempSrc:      SpO2: 97% 97% 98% 94%  Weight:      Height:       Eyes: PERRL, lids and conjunctivae normal ENMT: Mucous membranes are moist. Posterior pharynx clear of any exudate or lesions.Normal dentition.  Neck: normal, supple, no masses, no thyromegaly Respiratory: clear to auscultation bilaterally, no wheezing, no crackles. Normal respiratory effort. No accessory muscle use.  Cardiovascular: Regular rate and rhythm, no murmurs / rubs / gallops. No extremity edema.  2+ pedal pulses. No carotid bruits.  Abdomen: no tenderness, no masses palpated. No hepatosplenomegaly. Bowel sounds positive.  Musculoskeletal: RLE swelling, no sig ttp of ankle, mild.  Skin: no rashes, lesions, ulcers. No induration Neurologic: CN 2-12 grossly intact. Sensation intact. Strength 5/5 in all 4.  Psychiatric: Normal judgment and insight. Alert and oriented x 3. Normal mood.   Labs on Admission: I have personally reviewed following labs and imaging studies  CBC: Recent Labs  Lab 05/07/19 1123 05/07/19 1537  WBC 15.0* 15.4*  NEUTROABS 12.1* 12.0*  HGB 14.0 10.9*  HCT 42.5 32.9*  MCV 84.8 85.0  PLT 966* 123456*   Basic Metabolic Panel: Recent Labs  Lab 05/07/19 1123  NA 139  K 3.4*  CL 95*  CO2 29  GLUCOSE 118*  BUN 16  CREATININE 1.09  CALCIUM 9.3    GFR: Estimated Creatinine Clearance: 85.5 mL/min (by C-G formula based on SCr of 1.09 mg/dL). Liver Function Tests: Recent Labs  Lab 05/07/19 1123  AST 22  ALT 12  ALKPHOS 68  BILITOT 1.6*  PROT 9.0*  ALBUMIN 3.6   No results for input(s): LIPASE, AMYLASE in the last 168 hours. No results for input(s): AMMONIA in the last 168 hours. Coagulation Profile: Recent Labs  Lab 05/07/19 1123  INR 1.1   Cardiac Enzymes: No results for input(s): CKTOTAL, CKMB, CKMBINDEX, TROPONINI in the last 168 hours. BNP (last 3 results) No results for input(s): PROBNP in the last 8760 hours. HbA1C: No results for input(s): HGBA1C in the last 72 hours. CBG: No results for input(s): GLUCAP in the last 168 hours. Lipid Profile: No results for input(s): CHOL, HDL, LDLCALC, TRIG, CHOLHDL, LDLDIRECT in the last 72 hours. Thyroid Function Tests: No results for input(s): TSH, T4TOTAL, FREET4, T3FREE, THYROIDAB in the last 72 hours. Anemia Panel: No results for input(s): VITAMINB12, FOLATE, FERRITIN, TIBC, IRON, RETICCTPCT in the last 72 hours. Urine analysis:    Component Value Date/Time   COLORURINE AMBER (Libi Corso) 11/20/2016 1835   APPEARANCEUR HAZY (Zayda Angell) 11/20/2016 1835   APPEARANCEUR Clear 02/09/2012 1227   LABSPEC 1.017 11/20/2016 1835   LABSPEC 1.020 02/09/2012 1227   PHURINE 5.0 11/20/2016 1835   GLUCOSEU NEGATIVE 11/20/2016 1835   GLUCOSEU Negative 02/09/2012 1227   HGBUR MODERATE (Idelia Caudell) 11/20/2016 1835   BILIRUBINUR NEGATIVE 11/20/2016 1835   BILIRUBINUR Negative 02/09/2012 [place] 11/20/2016 [place] (Reeda Soohoo) 11/20/2016 1835   NITRITE NEGATIVE 11/20/2016 1835   LEUKOCYTESUR TRACE (Shaneka Efaw) 11/20/2016 1835   LEUKOCYTESUR Negative 02/09/2012 1227    Radiological Exams on Admission: DG Chest 2 View  Result Date: 05/07/2019 CLINICAL DATA:  Tachycardia EXAM: CHEST - 2 VIEW COMPARISON:  November 20, 2016. FINDINGS: There is ill-defined opacity in the lung bases without  consolidation. Heart size and pulmonary vascularity are normal. No adenopathy. There is elevation of the left hemidiaphragm. Visualized bowel appears mildly dilated without appreciable air-fluid levels. No free air. There are old healed rib fractures on the right. IMPRESSION: 1. Ill-defined opacity in each lung base, concerning for atypical organism pneumonia. No consolidation. Lungs elsewhere clear. 2. Elevation of left hemidiaphragm is stable. Visualized bowel appear somewhat dilated. Question Olufemi Mofield degree of underlying ileus. 3.  Heart size normal. 4.  No evident adenopathy. Electronically Signed   By: Lowella Grip III M.D.   On: 05/07/2019 12:06   DG Ankle Complete Right  Result Date: 05/07/2019 CLINICAL DATA:  Foot pain EXAM: RIGHT ANKLE - COMPLETE 3+ VIEW COMPARISON:  None.  FINDINGS: Alignment is anatomic. No acute fracture. Joint spaces are preserved. Vascular calcification is noted. Metallic fragments overlie the distal fibula. IMPRESSION: No acute osseous abnormality. Electronically Signed   By: Macy Mis M.D.   On: 05/07/2019 13:27   CT Angio Chest PE W and/or Wo Contrast  Result Date: 05/07/2019 CLINICAL DATA:  Pneumonia. EXAM: CT ANGIOGRAPHY CHEST WITH CONTRAST TECHNIQUE: Multidetector CT imaging of the chest was performed using the standard protocol during bolus administration of intravenous contrast. Multiplanar CT image reconstructions and MIPs were obtained to evaluate the vascular anatomy. CONTRAST:  27mL OMNIPAQUE IOHEXOL 350 MG/ML SOLN COMPARISON:  None. FINDINGS: Cardiovascular: Contrast injection is sufficient to demonstrate satisfactory opacification of the pulmonary arteries to the segmental level. There is no pulmonary embolus. The main pulmonary artery is within normal limits for size. There is no CT evidence of acute right heart strain. There are atherosclerotic changes of the visualized aorta without evidence for an aneurysm or dissection. Heart size is borderline enlarged.  Coronary artery calcifications are noted. Mediastinum/Nodes: --No mediastinal or hilar lymphadenopathy. --No axillary lymphadenopathy. --No supraclavicular lymphadenopathy. --there is Algenis Ballin small left-sided thyroid nodule, for which no further follow-up is required. --The esophagus is unremarkable Lungs/Pleura: There is Jhair Witherington moderate amount of debris within the right mainstem bronchus. There is no pneumothorax. No large pleural effusion. No focal infiltrate. There is elevation of the left hemidiaphragm with atelectasis at the left lung base. Upper Abdomen: There are probable nonobstructing stones in the upper pole the left kidney. There is Mando Blatz cyst arising from the upper pole of the right kidney. Musculoskeletal: No chest wall abnormality. No acute or significant osseous findings. There are old healed right-sided rib fractures. Review of the MIP images confirms the above findings. IMPRESSION: 1. No CT evidence for acute pulmonary embolus. 2. Moderate amount of debris within the right mainstem bronchus, which may indicate aspiration. 3. Elevation of the left hemidiaphragm with atelectasis at the left lung base. No focal infiltrate. 4. Aortic Atherosclerosis (ICD10-I70.0). Electronically Signed   By: Constance Holster M.D.   On: 05/07/2019 16:10   US Venous Img Lower Bilateral (DVT)  Result Date: 05/07/2019 CLINICAL DATA:  Edema EXAM: BILATERAL LOWER EXTREMITY VENOUS DOPPLER ULTRASOUND TECHNIQUE: Gray-scale sonography with compression, as well as color and duplex ultrasound, were performed to evaluate the deep venous system(s) from the level of the common femoral vein through the popliteal and proximal calf veins. COMPARISON:  02/08/2012 FINDINGS: VENOUS Normal compressibility of the common femoral, superficial femoral, and popliteal veins, as well as the visualized calf veins. Visualized portions of profunda femoral vein and great saphenous vein unremarkable. No filling defects to suggest DVT on grayscale or color  Doppler imaging. Doppler waveforms show normal direction of venous flow, normal respiratory phasicity and response to augmentation. Calf veins are poorly visualized bilaterally. OTHER None. Limitations: none IMPRESSION: No femoropopliteal DVT nor evidence of DVT within the visualized calf veins. If clinical symptoms are inconsistent or if there are persistent or worsening symptoms, further imaging (possibly involving the iliac veins) may be warranted. Electronically Signed   By: Lavonia Dana M.D.   On: 05/07/2019 18:01    EKG: Independently reviewed. Sinus tachycardia, appears similar to prior  Assessment/Plan Active Problems:   Tachycardia  Aspiration Pneumonia  Sepsis: CT notable for aspiration (debris in R mainstem).  Will make NPO and consult speech. Continue antibiotics with unasyn Follow blood and sputum cx Continue IVF Leukocytosis, mildly elevated lactic. Meets criteria for sepsis with tachycardia, tachypnea, leukocytosis.  Elevated Troponin:  suspect this is demand in the setting of above, though concerning for NSTEMI with continued rise  Follow echo Troponin -> 108 -> 340 -> 769 Continue heparin gtt Cardiology c/s, appreciate recs   Right Ankle Pain  Right Lower Extremity Swelling: pt suspects this may be gout, but on exam, he does not have the exquisite tenderness I'd expect with gout.  Will treat with colchicine.  Follow uric acid.  Ankle plain film wnl. Follow LE [place] (negative for DVT) Continue allopurinol and colchicine  Concern for Ileus on CXR: follow KUB  Thrombocytosis: c/s heme, suspect reactive from above?  Hypertension: continue amlodipine, coreg.  Hold lisinopril for now.  HLD: continue atorvastatin  Not taking zoloft per nephew  DVT prophylaxis: heparin gtt  Code Status: full  Family Communication: nephew  Disposition Plan: pending improvement  Consults called: cardiology Admission status: inpatient   Fayrene Helper MD [facility] Pager  AMION  If 7PM-7AM, please contact night-coverage [email or web address] Use universal [place] password for that web site. If you do not have the password, please call the hospital operator.  05/07/2019, 9:05 PM

## 2019-05-07 NOTE — Consult Note (Signed)
Pharmacy Antibiotic Note  Marc Smith is a 74 y.o. male admitted on 05/07/2019 with aspiration pneumonia.  Pharmacy has been consulted for Unasyn dosing.  Plan: Unasyn 3g IV Q6 hours  Height: 6\' 2"  (188 cm) Weight: 280 lb (127 kg) IBW/kg (Calculated) : 82.2  Temp (24hrs), Avg:99.3 F (37.4 C), Min:99.3 F (37.4 C), Max:99.3 F (37.4 C)  Recent Labs  Lab 05/07/19 1123 05/07/19 1341 05/07/19 1537  WBC 15.0*  --  15.4*  CREATININE 1.09  --   --   LATICACIDVEN 2.2* 2.3*  --     Estimated Creatinine Clearance: 85.5 mL/min (by C-G formula based on SCr of 1.09 mg/dL).    No Known Allergies  Antimicrobials this admission: Levaquin 3/16 x 1 Unasyn 3/16 >>     Microbiology results: 3/16 BCx: pending 3/16 MRSA PCR: pending  Thank you for allowing pharmacy to be a part of this patient's care.  Pearla Dubonnet 05/07/2019 9:33 PM

## 2019-05-07 NOTE — ED Notes (Signed)
This technician attempted to conduct a home medication interview with the patient, but he proved a poor historian. Attempted to contact the patient's emergency contact twice without success. Patient's insurance indicated no claim activity in 3+ months. Contacted patient's caregiver who reported patient's pharmacy is [facility]. Pharmacy records indicate no activity since 2018. Without any collateral information from other sources (e.g. family, chart notes) it seems patient has been taking medications either sporadically or not at all for some time.  ** The above is intended solely for informational and/or communicative purposes. It should in no way be considered an endorsement of any specific treatment, therapy or action. **

## 2019-05-07 NOTE — Consult Note (Signed)
CARDIOLOGY CONSULT NOTE               Patient ID: Marc Smith MRN: [id number] DOB/AGE: June 21, 1945 74 y.o.  Admit date: 05/07/2019 Referring Physician Dr. Lenise Arena  Primary Physician Dr. Clayborn Bigness  Primary Cardiologist N/A Reason for Consultation Elevated troponin   HPI: Marc Smith is a 74 year old male with a past medical history significant for hypertension, hyperlipidemia, and gout who presented to the ED on 05/07/19 for right foot pain.  He was noted to be tachycardic on arrival with ECG revealing sinus tachycardia at a ventricular rate of 147bpm.  High sensitivity troponin was elevated x 2 at 108, 340 respectively. CBC revealed a WBC of 15 and lactic acid was 2.2.  Chest xray revealed an ill defined opacity in bilateral lung bases, concerning for atypical pneumonia; chest CT pending.   Marc Smith is somewhat of a poor historian, but he denies chest pain, palpitations, shortness of breath, lower extremity swelling, orthopnea, PND, or syncopal/presyncopal episodes.  He has no prior cardiac history to report.   Review of systems complete and found to be negative unless listed above     Past Medical History:  Diagnosis Date  . Gout    Per Patient's nephew  . Hyperlipidemia    Patient's nephew  . Hypertension     History reviewed. No pertinent surgical history.  (Not in a hospital admission)  Social History   Socioeconomic History  . Marital status: Single    Spouse name: Not on file  . Number of children: Not on file  . Years of education: Not on file  . Highest education level: Not on file  Occupational History  . Not on file  Tobacco Use  . Smoking status: Current Every Day Smoker    Packs/day: 1.00    Types: Cigarettes  . Smokeless tobacco: Current User  Substance and Sexual Activity  . Alcohol use: No  . Drug use: No  . Sexual activity: Not on file  Other Topics Concern  . Not on file  Social History Narrative  . Not on file   Social  Determinants of Health   Financial Resource Strain:   . Difficulty of Paying Living Expenses:   Food Insecurity:   . Worried About Charity fundraiser in the Last Year:   . Arboriculturist in the Last Year:   Transportation Needs:   . Film/video editor (Medical):   Marland Kitchen Lack of Transportation (Non-Medical):   Physical Activity:   . Days of Exercise per Week:   . Minutes of Exercise per Session:   Stress:   . Feeling of Stress :   Social Connections:   . Frequency of Communication with Friends and Family:   . Frequency of Social Gatherings with Friends and Family:   . Attends Religious Services:   . Active Member of Clubs or Organizations:   . Attends Archivist Meetings:   Marland Kitchen Marital Status:   Intimate Partner Violence:   . Fear of Current or Ex-Partner:   . Emotionally Abused:   Marland Kitchen Physically Abused:   . Sexually Abused:     No family history on file.    Review of systems complete and found to be negative unless listed above    PHYSICAL EXAM  General: Well developed, well nourished, in no acute distress HEENT:  Normocephalic and atramatic Neck:  No JVD.  Lungs: Clear bilaterally to auscultation and percussion. Heart: Tachycardic, regular rhythm, with  occasional ectopic beats . Normal S1 and S2 without gallops or murmurs.  Abdomen: Bowel sounds are positive, abdomen soft and non-tender  Msk:  Back normal. Normal strength and tone for age. Extremities: No clubbing, cyanosis or edema.   Neuro: Alert and oriented X 3. Psych:  Good affect, responds appropriately  Labs:   Lab Results  Component Value Date   WBC 15.0 (H) 05/07/2019   HGB 14.0 05/07/2019   HCT 42.5 05/07/2019   MCV 84.8 05/07/2019   PLT 966 (HH) 05/07/2019    Recent Labs  Lab 05/07/19 1123  NA 139  K 3.4*  CL 95*  CO2 29  BUN 16  CREATININE 1.09  CALCIUM 9.3  PROT 9.0*  BILITOT 1.6*  ALKPHOS 68  ALT 12  AST 22  GLUCOSE 118*   Lab Results  Component Value Date   [id number] (H) 02/10/2012   TROPONINI <0.03 11/20/2016   No results found for: CHOL No results found for: HDL No results found for: LDLCALC No results found for: TRIG No results found for: CHOLHDL No results found for: LDLDIRECT    Radiology: DG Chest 2 View  Result Date: 05/07/2019 CLINICAL DATA:  Tachycardia EXAM: CHEST - 2 VIEW COMPARISON:  November 20, 2016. FINDINGS: There is ill-defined opacity in the lung bases without consolidation. Heart size and pulmonary vascularity are normal. No adenopathy. There is elevation of the left hemidiaphragm. Visualized bowel appears mildly dilated without appreciable air-fluid levels. No free air. There are old healed rib fractures on the right. IMPRESSION: 1. Ill-defined opacity in each lung base, concerning for atypical organism pneumonia. No consolidation. Lungs elsewhere clear. 2. Elevation of left hemidiaphragm is stable. Visualized bowel appear somewhat dilated. Question a degree of underlying ileus. 3.  Heart size normal. 4.  No evident adenopathy. Electronically Signed   By: Lowella Grip III M.D.   On: 05/07/2019 12:06   DG Ankle Complete Right  Result Date: 05/07/2019 CLINICAL DATA:  Foot pain EXAM: RIGHT ANKLE - COMPLETE 3+ VIEW COMPARISON:  None. FINDINGS: Alignment is anatomic. No acute fracture. Joint spaces are preserved. Vascular calcification is noted. Metallic fragments overlie the distal fibula. IMPRESSION: No acute osseous abnormality. Electronically Signed   By: Macy Mis M.D.   On: 05/07/2019 13:27    EKG: Sinus tachycardia with non specific ST/T wave abnormalities - no significant change from ECG in 2013   ASSESSMENT AND PLAN:  1.  Elevated troponin   -Likely secondary to tachycardia in the setting of leukocytosis, possible pneumonia   -Unlikely to be ACS in the absence of chest pain; will likely d/c heparin   -Third troponin pending   -Will repeat 12 lead ECG to determine any interval change   The history, physical exam  findings, and plan of care were all discussed with Dr. Bartholome Smith, and all decision making was made in collaboration.   Signed: Avie Arenas PA-C 05/07/2019, 3:43 PM

## 2019-05-07 NOTE — ED Provider Notes (Signed)
[facility] Emergency Department Provider Note       Time seen: ----------------------------------------- 12:22 PM on 05/07/2019 -----------------------------------------   I have reviewed the triage vital signs and the nursing notes.  HISTORY Chief Complaint Foot Pain    HPI Marc Smith is a 74 y.o. male with a history of gout, hyperlipidemia, hypertension, UTI who presents to the ED for right foot pain.  Patient states he has history of gout.  Of note his heart rate was noted to be 150 on arrival.  He denies chest pain or difficulty breathing.  Pain is 5 out of 10 in his right foot.  Patient states he is here because his caregiver was concerned that he could not walk due to right foot pain.  Past Medical History:  Diagnosis Date  . Gout    Per Patient's nephew  . Hyperlipidemia    Patient's nephew  . Hypertension     Patient Active Problem List   Diagnosis Date Noted  . UTI (urinary tract infection) 11/20/2016  . Acute lower UTI 11/20/2016  . Gout 11/20/2016  . Essential hypertension 11/20/2016    History reviewed. No pertinent surgical history.  Allergies Patient has no known allergies.  Social History Social History   Tobacco Use  . Smoking status: Current Every Day Smoker    Packs/day: 1.00    Types: Cigarettes  . Smokeless tobacco: Current User  Substance Use Topics  . Alcohol use: No  . Drug use: No    Review of Systems Constitutional: Negative for fever. Cardiovascular: Negative for chest pain. Respiratory: Negative for shortness of breath. Gastrointestinal: Negative for abdominal pain, vomiting and diarrhea. Musculoskeletal: Positive for right foot pain Skin: Negative for rash. Neurological: Negative for headaches, focal weakness or numbness.  All systems negative/normal/unremarkable except as stated in the HPI  ____________________________________________   PHYSICAL EXAM:  VITAL SIGNS: ED Triage Vitals  Enc  Vitals Group     BP 05/07/19 1112 (!) 157/109     Pulse Rate 05/07/19 1109 (!) 57     Resp 05/07/19 1112 20     Temp 05/07/19 1109 99.3 F (37.4 C)     Temp Source 05/07/19 1109 Oral     SpO2 05/07/19 1112 95 %     Weight 05/07/19 1108 280 lb (127 kg)     Height 05/07/19 1108 6\' 2"  (1.88 m)     Head Circumference --      Peak Flow --      Pain Score 05/07/19 1112 5     Pain Loc --      Pain Edu? --      Excl. in [place]? --     Constitutional: Alert and oriented.  No acute distress Eyes: Conjunctivae are normal. Normal extraocular movements. Cardiovascular: Normal rate, regular rhythm. No murmurs, rubs, or gallops. Respiratory: Normal respiratory effort without tachypnea nor retractions. Breath sounds are clear and equal bilaterally. No wheezes/rales/rhonchi. Gastrointestinal: Soft and nontender. Normal bowel sounds Musculoskeletal: Right medial ankle tenderness, no redness, some warmth is noted Neurologic:  Normal speech and language. No gross focal neurologic deficits are appreciated.  Skin:  Skin is warm, dry and intact. No rash noted. Psychiatric: Mood and affect are normal. Speech and behavior are normal.  ____________________________________________  EKG: Interpreted by me.  Sinus tachycardia with a rate of 147 bpm, normal PR interval, normal axis, normal QT, LVH with repolarization abnormality  ____________________________________________  ED COURSE:  As part of my medical decision making, I reviewed the  following data within the [place] History obtained from family if available, nursing notes, old chart and ekg, as well as notes from prior ED visits. Patient presented for right foot pain noted to be tachycardic on arrival, we will assess with labs and imaging as indicated at this time.   Procedures  Marc Smith was evaluated in Emergency Department on 05/07/2019 for the symptoms described in the history of present illness. He was evaluated in the  context of the global COVID-19 pandemic, which necessitated consideration that the patient might be at risk for infection with the SARS-CoV-2 virus that causes COVID-19. Institutional protocols and algorithms that pertain to the evaluation of patients at risk for COVID-19 are in a state of rapid change based on information released by regulatory bodies including the CDC and federal and state organizations. These policies and algorithms were followed during the patient's care in the ED.  ____________________________________________   LABS (pertinent positives/negatives)  Labs Reviewed  COMPREHENSIVE METABOLIC PANEL - Abnormal; Notable for the following components:      Result Value   Potassium 3.4 (*)    Chloride 95 (*)    Glucose, Bld 118 (*)    Total Protein 9.0 (*)    Total Bilirubin 1.6 (*)    All other components within normal limits  LACTIC ACID, PLASMA - Abnormal; Notable for the following components:   Lactic Acid, Venous 2.2 (*)    All other components within normal limits  CBC WITH DIFFERENTIAL/PLATELET - Abnormal; Notable for the following components:   WBC 15.0 (*)    Platelets 966 (*)    Neutro Abs 12.1 (*)    Monocytes Absolute 1.5 (*)    All other components within normal limits  TROPONIN I (HIGH SENSITIVITY) - Abnormal; Notable for the following components:   Troponin I (High Sensitivity) 108 (*)    All other components within normal limits  CULTURE, BLOOD (ROUTINE X 2)  CULTURE, BLOOD (ROUTINE X 2)  PROTIME-INR  LACTIC ACID, PLASMA  URINALYSIS, COMPLETE (UACMP) WITH MICROSCOPIC  TROPONIN I (HIGH SENSITIVITY)    RADIOLOGY Images were viewed by me  Chest x-ray, right foot x-ray IMPRESSION:  No acute osseous abnormality.  IMPRESSION:  1. Ill-defined opacity in each lung base, concerning for atypical  organism pneumonia. No consolidation. Lungs elsewhere clear.   2. Elevation of left hemidiaphragm is stable. Visualized bowel  appear somewhat dilated. Question  a degree of underlying ileus.   3. Heart size normal.   4. No evident adenopathy.  ____________________________________________   DIFFERENTIAL DIAGNOSIS   SVT, arrhythmia, MI, dehydration, electrolyte abnormality, anemia, sepsis, gout, osteomyelitis  FINAL ASSESSMENT AND PLAN  Weakness, gout, elevated troponin, thrombocytosis, possible pneumonia   Plan: The patient had presented for right foot pain and difficulty walking. Patient's labs did surprisingly feel elevated troponin which possibly is demand related.  His platelets were markedly elevated at 966,000. Patient's imaging of his foot was unremarkable.  Chest x-ray revealed possible atypical pneumonia.  I have ordered IV Levaquin for him.  He also received some IV fluids due to a slightly elevated lactic acid level and elevated platelet level.  He has had intermittent bouts of tachycardia which may be causing an elevated troponin.  I will discuss with the hospitalist for admission.   Laurence Aly, MD    Note: This note was generated in part or whole with voice recognition software. Voice recognition is usually quite accurate but there are transcription errors that can and very often  do occur. I apologize for any typographical errors that were not detected and corrected.     Earleen Newport, MD 05/07/19 [phone]

## 2019-05-07 NOTE — Consult Note (Signed)
Hematology/Oncology Consult note [facility] Telephone:([phone] [phone]  Patient Care Team: Lavera Guise, MD as PCP - General (Internal Medicine)   Name of the patient: Marc Smith  [id number]  10/29/1945   Date of visit: 05/07/19 REASON FOR COSULTATION:  Thrombocytosis History of presenting illness-  74 y.o. male with PMH listed at below who presents to ER for evaluation of right foot pain.  Patient is a poor historian.  Patient lives at home with nephew. History was obtained mainly from chart reviewing.  Patient is not able to provide detailed history. Right foot pain for few days, limited movement due to pain.  Patient was sent to ER for evaluation   In emergency room, patient was noted to have tachycardia with heart rate in 150s, troponin was elevated.  Patient was seen by cardiology.  Heparin GTT was started for the concerns of NSTEMI. Right ankle pain, right lower extremity ultrasound was obtained in the emergency room and was negative for DVT. Patient has been on allopurinol and colchicine.  He is noted to have a history of gout. His blood work showed elevated total WBC at 15, hemoglobin 14 increased platelet count 966,000. Patient does not have any recent blood work. His other blood work in current EMR was from 11/24/2011 and at that time he has a normal platelet count of 359. CBC was repeated which showed persistently elevated total white count, hemoglobin has decreased to 10.9, platelet count 7 97,000. Hematology oncology was consulted for evaluation of thrombocytosis 05/07/2019, right ankle x-ray showed no acute osseous abnormality.  Chest x-ray showed ill-defined opacity in the lung bases without consolidation, concerning for atypical organism pneumonia. CT angio chest PE with and without contrast showed no PE, moderate amount depressed within the right mainstem bronchus, which may indicate aspiration.  Patient has no complaints.  Patient denies any fever, chills, cough.  No shortness of breath. He lives with his nephew.  At baseline, he walks with a cane Review of Systems  Constitutional: Negative for appetite change and fatigue.  HENT:   Negative for sore throat.   Eyes: Negative for eye problems.  Respiratory: Negative for cough, hemoptysis and shortness of breath.   Cardiovascular: Negative for chest pain.  Gastrointestinal: Negative for abdominal pain and blood in stool.  Genitourinary: Negative for difficulty urinating.   Musculoskeletal:       Right foot pain  Skin: Negative for rash.  Neurological: Negative for headaches.  Hematological: Negative for adenopathy. Does not bruise/bleed easily.  Psychiatric/Behavioral: Negative for confusion.    No Known Allergies  Patient Active Problem List   Diagnosis Date Noted  . Tachycardia 05/07/2019  . Aspiration pneumonia ([place])   . Elevated troponin   . UTI (urinary tract infection) 11/20/2016  . Acute lower UTI 11/20/2016  . Gout 11/20/2016  . Essential hypertension 11/20/2016     Past Medical History:  Diagnosis Date  . Gout    Per Patient's nephew  . Hyperlipidemia    Patient's nephew  . Hypertension      History reviewed. No pertinent surgical history.  Social History   Socioeconomic History  . Marital status: Single    Spouse name: Not on file  . Number of children: Not on file  . Years of education: Not on file  . Highest education level: Not on file  Occupational History  . Not on file  Tobacco Use  . Smoking status: Current Every Day Smoker    Packs/day: 1.00  Types: Cigarettes  . Smokeless tobacco: Current User  Substance and Sexual Activity  . Alcohol use: No  . Drug use: No  . Sexual activity: Not on file  Other Topics Concern  . Not on file  Social History Narrative  . Not on file   Social Determinants of Health   Financial Resource Strain:   . Difficulty of Paying Living Expenses:   Food Insecurity:   .  Worried About Charity fundraiser in the Last Year:   . Arboriculturist in the Last Year:   Transportation Needs:   . Film/video editor (Medical):   Marland Kitchen Lack of Transportation (Non-Medical):   Physical Activity:   . Days of Exercise per Week:   . Minutes of Exercise per Session:   Stress:   . Feeling of Stress :   Social Connections:   . Frequency of Communication with Friends and Family:   . Frequency of Social Gatherings with Friends and Family:   . Attends Religious Services:   . Active Member of Clubs or Organizations:   . Attends Archivist Meetings:   Marland Kitchen Marital Status:   Intimate Partner Violence:   . Fear of Current or Ex-Partner:   . Emotionally Abused:   Marland Kitchen Physically Abused:   . Sexually Abused:      No family history on file.   Current Facility-Administered Medications:  .  acetaminophen (TYLENOL) tablet 650 mg, 650 mg, Oral, Q6H PRN **OR** acetaminophen (TYLENOL) suppository 650 mg, 650 mg, Rectal, Q6H PRN, Elodia Florence., MD .  Derrill Memo ON 05/08/2019] allopurinol (ZYLOPRIM) tablet 100 mg, 100 mg, Oral, Daily, Elodia Florence., MD .  amLODipine (NORVASC) tablet 10 mg, 10 mg, Oral, Daily, Elodia Florence., MD, 10 mg at 05/07/19 2123 .  Ampicillin-Sulbactam (UNASYN) 3 g in sodium chloride 0.9 % 100 mL IVPB, 3 g, Intravenous, Q6H, Nazari, Walid A, RPH, Stopped at 05/07/19 2238 .  [START ON 05/08/2019] aspirin chewable tablet 81 mg, 81 mg, Oral, Daily, Elodia Florence., MD .  atorvastatin (LIPITOR) tablet 40 mg, 40 mg, Oral, Daily, Elodia Florence., MD, 40 mg at 05/07/19 2123 .  [START ON 05/08/2019] carvedilol (COREG) tablet 25 mg, 25 mg, Oral, BID WC, Elodia Florence., MD .  Derrill Memo ON 05/08/2019] colchicine tablet 0.6 mg, 0.6 mg, Oral, Daily, Elodia Florence., MD .  [COMPLETED] heparin bolus via infusion 4,000 Units, 4,000 Units, Intravenous, Once, 4,000 Units at 05/07/19 1613 **FOLLOWED BY** heparin ADULT infusion 100  units/mL (25000 units/268mL sodium chloride 0.45%), 1,500 Units/hr, Intravenous, Continuous, Elodia Florence., MD, Last Rate: 15 mL/hr at 05/07/19 1612, 1,500 Units/hr at 05/07/19 1612 .  lactated ringers infusion, , Intravenous, Continuous, Elodia Florence., MD, Last Rate: 75 mL/hr at 05/07/19 2125, New Bag at 05/07/19 2125 .  oxyCODONE (Oxy IR/ROXICODONE) immediate release tablet 5 mg, 5 mg, Oral, Q4H PRN, Elodia Florence., MD .  potassium chloride 10 mEq in 100 mL IVPB, 10 mEq, Intravenous, Q1 Hr x 4, Elodia Florence., MD, Last Rate: 100 mL/hr at 05/07/19 2239, 10 mEq at 05/07/19 2239  Current Outpatient Medications:  .  allopurinol (ZYLOPRIM) 100 MG tablet, Take 100 mg by mouth daily., Disp: , Rfl:  .  amLODipine (NORVASC) 10 MG tablet, Take 10 mg by mouth daily., Disp: , Rfl:  .  atorvastatin (LIPITOR) 40 MG tablet, Take 40 mg by mouth daily., Disp: , Rfl:  .  carvedilol (COREG) 25 MG tablet, Take 25 mg by mouth 2 (two) times daily with a meal., Disp: , Rfl:  .  colchicine 0.6 MG tablet, Take 0.6 mg by mouth daily., Disp: , Rfl:  .  lisinopril (PRINIVIL,ZESTRIL) 40 MG tablet, Take 40 mg by mouth daily., Disp: , Rfl:  .  sertraline (ZOLOFT) 50 MG tablet, Take 50 mg by mouth daily., Disp: , Rfl:    Physical exam:  Vitals:   05/07/19 2100 05/07/19 2130 05/07/19 2200 05/07/19 2230  BP: (!) 150/96 (!) 162/89 (!) 163/89 (!) 170/89  Pulse: 97 90 95 98  Resp: (!) 23 18 (!) 23 (!) 25  Temp:      TempSrc:      SpO2: 96% 99% 98% 97%  Weight:      Height:       Physical Exam  Constitutional: He is oriented to person, place, and time. No distress.  HENT:  Head: Normocephalic and atraumatic.  Nose: Nose normal.  Mouth/Throat: Oropharynx is clear and moist. No oropharyngeal exudate.  Eyes: Pupils are equal, round, and reactive to light. EOM are normal. No scleral icterus.  Cardiovascular: Normal rate and regular rhythm.  No murmur heard. Pulmonary/Chest: Effort  normal. No respiratory distress. He has no rales. He exhibits no tenderness.  Abdominal: Soft. He exhibits no distension. There is no abdominal tenderness.  Musculoskeletal:        General: No edema. Normal range of motion.     Cervical back: Normal range of motion and neck supple.  Neurological: He is alert and oriented to person, place, and time. No cranial nerve deficit. He exhibits normal muscle tone. Coordination normal.  Skin: Skin is warm and dry. He is not diaphoretic. No erythema.  Psychiatric: Affect normal.        CMP Latest Ref Rng & Units 05/07/2019  Glucose 70 - 99 mg/dL 118(H)  BUN 8 - 23 mg/dL 16  Creatinine 0.61 - 1.24 mg/dL 1.09  Sodium 135 - 145 mmol/L 139  Potassium 3.5 - 5.1 mmol/L 3.4(L)  Chloride 98 - 111 mmol/L 95(L)  CO2 22 - 32 mmol/L 29  Calcium 8.9 - 10.3 mg/dL 9.3  Total Protein 6.5 - 8.1 g/dL 9.0(H)  Total Bilirubin 0.3 - 1.2 mg/dL 1.6(H)  Alkaline Phos 38 - 126 U/L 68  AST 15 - 41 U/L 22  ALT 0 - 44 U/L 12   CBC Latest Ref Rng & Units 05/07/2019  WBC 4.0 - 10.5 K/uL 15.4(H)  Hemoglobin 13.0 - 17.0 g/dL 10.9(L)  Hematocrit 39.0 - 52.0 % 32.9(L)  Platelets 150 - 400 K/uL 797(H)   RADIOGRAPHIC STUDIES: I have personally reviewed the radiological images as listed and agreed with the findings in the report. DG Chest 2 View  Result Date: 05/07/2019 CLINICAL DATA:  Tachycardia EXAM: CHEST - 2 VIEW COMPARISON:  November 20, 2016. FINDINGS: There is ill-defined opacity in the lung bases without consolidation. Heart size and pulmonary vascularity are normal. No adenopathy. There is elevation of the left hemidiaphragm. Visualized bowel appears mildly dilated without appreciable air-fluid levels. No free air. There are old healed rib fractures on the right. IMPRESSION: 1. Ill-defined opacity in each lung base, concerning for atypical organism pneumonia. No consolidation. Lungs elsewhere clear. 2. Elevation of left hemidiaphragm is stable. Visualized bowel  appear somewhat dilated. Question a degree of underlying ileus. 3.  Heart size normal. 4.  No evident adenopathy. Electronically Signed   By: Lowella Grip III M.D.   On: 05/07/2019 12:06  DG Ankle Complete Right  Result Date: 05/07/2019 CLINICAL DATA:  Foot pain EXAM: RIGHT ANKLE - COMPLETE 3+ VIEW COMPARISON:  None. FINDINGS: Alignment is anatomic. No acute fracture. Joint spaces are preserved. Vascular calcification is noted. Metallic fragments overlie the distal fibula. IMPRESSION: No acute osseous abnormality. Electronically Signed   By: Macy Mis M.D.   On: 05/07/2019 13:27   DG Abd 1 View  Result Date: 05/07/2019 CLINICAL DATA:  Bowel obstruction. EXAM: ABDOMEN - 1 VIEW COMPARISON:  None. FINDINGS: There are multiple old healed right-sided rib fractures. There is gaseous distention of loops of colon scattered throughout the abdomen. There is a moderate amount of stool at the level of the rectum. There is no pneumatosis. No definite free air. No evidence for a small bowel obstruction. There are degenerative changes throughout the visualized lumbar spine. 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 IMPRESSION: 1. Gaseous distention of the colon which may represent a colonic ileus. 2. No evidence for a small bowel obstruction. 3. Moderate amount of stool at the level of the rectum. Electronically Signed   By: Constance Holster M.D.   On: 05/07/2019 21:52   CT Angio Chest PE W and/or Wo Contrast  Result Date: 05/07/2019 CLINICAL DATA:  Pneumonia. EXAM: CT ANGIOGRAPHY CHEST WITH CONTRAST TECHNIQUE: Multidetector CT imaging of the chest was performed using the standard protocol during bolus administration of intravenous contrast. Multiplanar CT image reconstructions and MIPs were obtained to evaluate the vascular anatomy. CONTRAST:  33mL OMNIPAQUE IOHEXOL 350 MG/ML SOLN COMPARISON:  None. FINDINGS: Cardiovascular: Contrast injection is sufficient to demonstrate satisfactory  opacification of the pulmonary arteries to the segmental level. There is no pulmonary embolus. The main pulmonary artery is within normal limits for size. There is no CT evidence of acute right heart strain. There are atherosclerotic changes of the visualized aorta without evidence for an aneurysm or dissection. Heart size is borderline enlarged. Coronary artery calcifications are noted. Mediastinum/Nodes: --No mediastinal or hilar lymphadenopathy. --No axillary lymphadenopathy. --No supraclavicular lymphadenopathy. --there is a small left-sided thyroid nodule, for which no further follow-up is required. --The esophagus is unremarkable Lungs/Pleura: There is a moderate amount of debris within the right mainstem bronchus. There is no pneumothorax. No large pleural effusion. No focal infiltrate. There is elevation of the left hemidiaphragm with atelectasis at the left lung base. Upper Abdomen: There are probable nonobstructing stones in the upper pole the left kidney. There is a cyst arising from the upper pole of the right kidney. Musculoskeletal: No chest wall abnormality. No acute or significant osseous findings. There are old healed right-sided rib fractures. Review of the MIP images confirms the above findings. IMPRESSION: 1. No CT evidence for acute pulmonary embolus. 2. Moderate amount of debris within the right mainstem bronchus, which may indicate aspiration. 3. Elevation of the left hemidiaphragm with atelectasis at the left lung base. No focal infiltrate. 4. Aortic Atherosclerosis (ICD10-I70.0). Electronically Signed   By: Constance Holster M.D.   On: 05/07/2019 16:10   US Venous Img Lower Bilateral (DVT)  Result Date: 05/07/2019 CLINICAL DATA:  Edema EXAM: BILATERAL LOWER EXTREMITY VENOUS DOPPLER ULTRASOUND TECHNIQUE: Gray-scale sonography with compression, as well as color and duplex ultrasound, were performed to evaluate the deep venous system(s) from the level of the common femoral vein through the  popliteal and proximal calf veins. COMPARISON:  02/08/2012 FINDINGS: VENOUS Normal compressibility of the common femoral, superficial femoral, and popliteal veins, as well as the visualized calf veins. Visualized portions of profunda femoral vein and  great saphenous vein unremarkable. No filling defects to suggest DVT on grayscale or color Doppler imaging. Doppler waveforms show normal direction of venous flow, normal respiratory phasicity and response to augmentation. Calf veins are poorly visualized bilaterally. OTHER None. Limitations: none IMPRESSION: No femoropopliteal DVT nor evidence of DVT within the visualized calf veins. If clinical symptoms are inconsistent or if there are persistent or worsening symptoms, further imaging (possibly involving the iliac veins) may be warranted. Electronically Signed   By: Lavonia Dana M.D.   On: 05/07/2019 18:01    Assessment and plan- Patient is a 74 y.o. male presented for right ankle pain. Work-up showed aspiration pneumonia, thrombocytosis and leukocytosis.  #Thrombocytosis, initial platelet count 966,000, which has decreased to 797,000 on repeat CBC, which is likely secondary to the dilutional effect secondary to IV fluid hydration. Thrombocytosis can be nonspecific due to acute inflammation/infection, iron deficiency, versus myeloproliferative disorders. I will check iron, TIBC, ferritin, pathology smear has been ordered.  #Leukocytosis, predominantly neutrophilia and monocytosis etiology unknown work-up.  Patient has slightly elevated lactic acid.  Possible reactive to aspiration pneumonia versus myeloproliferative disorders.  No recent prior CBC to compare.  #Aspiration pneumonia, CT images were independently viewed by me.  Right mainstem. Speech pathology evaluation. Patient has been started on antibiotics with Unasyn. Continue IV fluid. #Elevated troponin, has been started on heparin drip. #Anemia, hemoglobin 10.9, at his chronic baseline.   Normocytic.  Increased total protein #Chronically elevated total bilirubin.  Check direct bilirubin   Thank you for allowing me to participate in the care of this patient.    Earlie Server, MD, PhD Hematology Oncology [facility] Pager- [phone] 05/07/2019

## 2019-05-07 NOTE — ED Notes (Signed)
Date and time results received: 05/07/19 1:28 PM  (use smartphrase ".now" to insert current time)  Test: Lactic Acid Critical Value: 2.2  Name of Provider Notified: Dr. Jimmye Norman   Orders Received? Or Actions Taken?: No new orders at this time

## 2019-05-07 NOTE — Consult Note (Signed)
ANTICOAGULATION CONSULT NOTE - Initial Consult  Pharmacy Consult for Heparin Drip Indication: ACS/STEMI  No Known Allergies  Patient Measurements: Height: 6\' 2"  (188 cm) Weight: 280 lb (127 kg) IBW/kg (Calculated) : 82.2 Heparin Dosing Weight: 110 kg  Vital Signs: Temp: 99.3 F (37.4 C) (03/16 1109) Temp Source: Oral (03/16 1109) BP: 172/99 (03/16 1330) Pulse Rate: 118 (03/16 1330)  Labs: Recent Labs    05/07/19 1123 05/07/19 1341  HGB 14.0  --   HCT 42.5  --   PLT 966*  --   LABPROT 14.3  --   INR 1.1  --   CREATININE 1.09  --   TROPONINIHS 108* 340*    Estimated Creatinine Clearance: 85.5 mL/min (by C-G formula based on SCr of 1.09 mg/dL).   Medical History: Past Medical History:  Diagnosis Date  . Gout    Per Patient's nephew  . Hyperlipidemia    Patient's nephew  . Hypertension     Medications:  No pta anticoagulation of record  Assessment: 74 y.o. male with a history of gout, hyperlipidemia, hypertension, UTI who presents to the ED for right foot pain.  Patient states he has history of gout.  Of note his heart rate was noted to be 150 on arrival.  Pt now with troponins trending up - pharmacy has been consulted to initiate and monitor heparin drip.  Goal of Therapy:  Heparin level 0.3-0.7 units/ml Monitor platelets by anticoagulation protocol: Yes   Plan:  Give 4000 units bolus x 1 Start heparin infusion at 1500 units/hr Check anti-Xa level in 8 hours and daily while on heparin Continue to monitor H&H and platelets  Lu Duffel, PharmD, BCPS Clinical Pharmacist 05/07/2019 3:16 PM

## 2019-05-07 NOTE — ED Triage Notes (Signed)
Pt presents to ED via POV with c/o R foot pain. Pt states hx of gout. Upon arrival pt's HR noted to be 149. Pt denies CP/SOB at this time. Pt denies fevers at home.   Pt is alert and oriented upon arrival to ED.

## 2019-05-08 ENCOUNTER — Inpatient Hospital Stay: Payer: Medicare Other

## 2019-05-08 ENCOUNTER — Observation Stay
Admit: 2019-05-08 | Discharge: 2019-05-08 | Disposition: A | Discharge status: Home / Self Care | Payer: Medicare Other | Attending: Family Medicine | Admitting: Family Medicine

## 2019-05-08 DIAGNOSIS — M109 Gout, unspecified: Secondary | ICD-10-CM | POA: Diagnosis present

## 2019-05-08 DIAGNOSIS — E785 Hyperlipidemia, unspecified: Secondary | ICD-10-CM | POA: Diagnosis present

## 2019-05-08 DIAGNOSIS — R32 Unspecified urinary incontinence: Secondary | ICD-10-CM | POA: Diagnosis present

## 2019-05-08 DIAGNOSIS — R000 Tachycardia, unspecified: Secondary | ICD-10-CM | POA: Diagnosis present

## 2019-05-08 DIAGNOSIS — I10 Essential (primary) hypertension: Secondary | ICD-10-CM | POA: Diagnosis present

## 2019-05-08 DIAGNOSIS — D473 Essential (hemorrhagic) thrombocythemia: Secondary | ICD-10-CM | POA: Diagnosis not present

## 2019-05-08 DIAGNOSIS — K6389 Other specified diseases of intestine: Secondary | ICD-10-CM | POA: Diagnosis present

## 2019-05-08 DIAGNOSIS — I071 Rheumatic tricuspid insufficiency: Secondary | ICD-10-CM | POA: Diagnosis present

## 2019-05-08 DIAGNOSIS — F17210 Nicotine dependence, cigarettes, uncomplicated: Secondary | ICD-10-CM | POA: Diagnosis present

## 2019-05-08 DIAGNOSIS — R5381 Other malaise: Secondary | ICD-10-CM | POA: Diagnosis not present

## 2019-05-08 DIAGNOSIS — J690 Pneumonitis due to inhalation of food and vomit: Secondary | ICD-10-CM | POA: Diagnosis present

## 2019-05-08 DIAGNOSIS — M79604 Pain in right leg: Secondary | ICD-10-CM

## 2019-05-08 DIAGNOSIS — Z79899 Other long term (current) drug therapy: Secondary | ICD-10-CM | POA: Diagnosis not present

## 2019-05-08 DIAGNOSIS — R54 Age-related physical debility: Secondary | ICD-10-CM | POA: Diagnosis present

## 2019-05-08 DIAGNOSIS — R778 Other specified abnormalities of plasma proteins: Secondary | ICD-10-CM | POA: Diagnosis not present

## 2019-05-08 DIAGNOSIS — R159 Full incontinence of feces: Secondary | ICD-10-CM | POA: Diagnosis present

## 2019-05-08 DIAGNOSIS — Z20822 Contact with and (suspected) exposure to covid-19: Secondary | ICD-10-CM | POA: Diagnosis present

## 2019-05-08 DIAGNOSIS — M10071 Idiopathic gout, right ankle and foot: Secondary | ICD-10-CM | POA: Diagnosis not present

## 2019-05-08 DIAGNOSIS — Z87891 Personal history of nicotine dependence: Secondary | ICD-10-CM | POA: Diagnosis not present

## 2019-05-08 DIAGNOSIS — R609 Edema, unspecified: Secondary | ICD-10-CM | POA: Diagnosis present

## 2019-05-08 DIAGNOSIS — D649 Anemia, unspecified: Secondary | ICD-10-CM | POA: Diagnosis present

## 2019-05-08 DIAGNOSIS — J189 Pneumonia, unspecified organism: Secondary | ICD-10-CM | POA: Diagnosis not present

## 2019-05-08 LAB — RETIC PANEL
Immature Retic Fract: 8.9 % (ref 2.3–15.9)
RBC.: 3.83 MIL/uL — ABNORMAL LOW (ref 4.22–5.81)
Retic Count, Absolute: 47.1 10*3/uL (ref 19.0–186.0)
Retic Ct Pct: 1.2 % (ref 0.4–3.1)
Reticulocyte Hemoglobin: 27.8 pg — ABNORMAL LOW (ref >27.9)

## 2019-05-08 LAB — LIPID PANEL
Cholesterol: 124 mg/dL (ref 0–200)
HDL: 33 mg/dL — ABNORMAL LOW (ref >40)
LDL Cholesterol: 81 mg/dL (ref 0–99)
Total CHOL/HDL Ratio: 3.8 RATIO
Triglycerides: 52 mg/dL (ref <150)
VLDL: 10 mg/dL (ref 0–40)

## 2019-05-08 LAB — BILIRUBIN, DIRECT: Bilirubin, Direct: 0.2 mg/dL (ref 0.0–0.2)

## 2019-05-08 LAB — LACTIC ACID, PLASMA: Lactic Acid, Venous: 1.1 mmol/L (ref 0.5–1.9)

## 2019-05-08 LAB — CBC
HCT: 32.4 % — ABNORMAL LOW (ref 39.0–52.0)
Hemoglobin: 10.7 g/dL — ABNORMAL LOW (ref 13.0–17.0)
MCH: 28.1 pg (ref 26.0–34.0)
MCHC: 33 g/dL (ref 30.0–36.0)
MCV: 85 fL (ref 80.0–100.0)
Platelets: 747 10*3/uL — ABNORMAL HIGH (ref 150–400)
RBC: 3.81 MIL/uL — ABNORMAL LOW (ref 4.22–5.81)
RDW: 14.1 % (ref 11.5–15.5)
WBC: 13.8 10*3/uL — ABNORMAL HIGH (ref 4.0–10.5)
nRBC: 0 % (ref 0.0–0.2)

## 2019-05-08 LAB — HEPARIN LEVEL (UNFRACTIONATED): Heparin Unfractionated: 0.1 IU/mL — ABNORMAL LOW (ref 0.30–0.70)

## 2019-05-08 LAB — URIC ACID: Uric Acid, Serum: 6.7 mg/dL (ref 3.7–8.6)

## 2019-05-08 LAB — IRON AND TIBC
Iron: 31 ug/dL — ABNORMAL LOW (ref 45–182)
Saturation Ratios: 21 % (ref 17.9–39.5)
TIBC: 146 ug/dL — ABNORMAL LOW (ref 250–450)
UIBC: 115 ug/dL

## 2019-05-08 LAB — ECHOCARDIOGRAM COMPLETE
Height: 72 in
Weight: 2937.6 oz

## 2019-05-08 LAB — PATHOLOGIST SMEAR REVIEW

## 2019-05-08 LAB — PROCALCITONIN: Procalcitonin: 0.12 ng/mL

## 2019-05-08 LAB — FERRITIN: Ferritin: 246 ng/mL (ref 24–336)

## 2019-05-08 LAB — TROPONIN I (HIGH SENSITIVITY)
Troponin I (High Sensitivity): 676 ng/L (ref <18)
Troponin I (High Sensitivity): 665 ng/L (ref <18)

## 2019-05-08 LAB — HEMOGLOBIN A1C
Hgb A1c MFr Bld: 5.5 % (ref 4.8–5.6)
Mean Plasma Glucose: 111.15 mg/dL

## 2019-05-08 MED ORDER — ENOXAPARIN SODIUM 40 MG/0.4ML ~~LOC~~ SOLN
40.0000 mg | SUBCUTANEOUS | Status: DC
  Administered 2019-05-08 – 2019-05-13 (×6): 40 mg via SUBCUTANEOUS
  Filled 2019-05-08 (×6): qty 0.4

## 2019-05-08 MED ORDER — ACETAMINOPHEN 325 MG PO TABS
650.0000 mg | ORAL_TABLET | Freq: Four times a day (QID) | ORAL | Status: DC | PRN
  Administered 2019-05-08: 650 mg via ORAL
  Filled 2019-05-08: qty 2

## 2019-05-08 MED ORDER — ACETAMINOPHEN 650 MG RE SUPP: 650.0000 mg | Freq: Four times a day (QID) | RECTAL | Status: DC | PRN

## 2019-05-08 MED ORDER — HEPARIN BOLUS VIA INFUSION
2000.0000 [IU] | Freq: Once | INTRAVENOUS | Status: AC
  Administered 2019-05-08: 2000 [IU] via INTRAVENOUS
  Filled 2019-05-08: qty 2000

## 2019-05-08 NOTE — Progress Notes (Signed)
Hematology/Oncology Progress Note [facility] [phone] [phone]  Patient Care Team: Lavera Guise, MD as PCP - General (Internal Medicine)   Name of the patient: Marc Smith  [id number]  1945/11/23  Date of visit: 05/08/19   INTERVAL HISTORY-  Patient was seen at the bedside.  He has no new complaints.  Right ankle pain has improved.   Review of systems- Review of Systems  Constitutional: Negative for fatigue.  Respiratory: Negative for cough and shortness of breath.   Cardiovascular: Negative for chest pain.  Gastrointestinal: Negative for abdominal pain.  Genitourinary: Negative for difficulty urinating.   Neurological: Negative for dizziness.  Hematological: Negative for adenopathy. Does not bruise/bleed easily.    No Known Allergies  Patient Active Problem List   Diagnosis Date Noted  . Acute pain of right lower extremity 05/08/2019  . Tachycardia 05/07/2019  . Aspiration pneumonia ([place])   . Elevated troponin I level   . Thrombocytosis ([place])   . Leukocytosis   . Community acquired pneumonia   . UTI (urinary tract infection) 11/20/2016  . Acute lower UTI 11/20/2016  . Gout 11/20/2016  . Essential hypertension 11/20/2016     Past Medical History:  Diagnosis Date  . Gout    Per Patient's nephew  . Hyperlipidemia    Patient's nephew  . Hypertension      History reviewed. No pertinent surgical history.  Social History   Socioeconomic History  . Marital status: Single    Spouse name: Not on file  . Number of children: Not on file  . Years of education: Not on file  . Highest education level: Not on file  Occupational History  . Not on file  Tobacco Use  . Smoking status: Current Every Day Smoker    Packs/day: 1.00    Types: Cigarettes  . Smokeless tobacco: Current User  Substance and Sexual Activity  . Alcohol use: No  . Drug use: No  . Sexual activity: Not on file  Other Topics Concern  . Not  on file  Social History Narrative  . Not on file   Social Determinants of Health   Financial Resource Strain:   . Difficulty of Paying Living Expenses:   Food Insecurity:   . Worried About Charity fundraiser in the Last Year:   . Arboriculturist in the Last Year:   Transportation Needs:   . Film/video editor (Medical):   Marland Kitchen Lack of Transportation (Non-Medical):   Physical Activity:   . Days of Exercise per Week:   . Minutes of Exercise per Session:   Stress:   . Feeling of Stress :   Social Connections:   . Frequency of Communication with Friends and Family:   . Frequency of Social Gatherings with Friends and Family:   . Attends Religious Services:   . Active Member of Clubs or Organizations:   . Attends Archivist Meetings:   Marland Kitchen Marital Status:   Intimate Partner Violence:   . Fear of Current or Ex-Partner:   . Emotionally Abused:   Marland Kitchen Physically Abused:   . Sexually Abused:      No family history on file.   Current Facility-Administered Medications:  .  acetaminophen (TYLENOL) tablet 650 mg, 650 mg, Oral, Q6H PRN **OR** acetaminophen (TYLENOL) suppository 650 mg, 650 mg, Rectal, Q6H PRN, Benita Gutter, RPH .  allopurinol (ZYLOPRIM) tablet 100 mg, 100 mg, Oral, Daily, Elodia Florence., MD, 100 mg at  05/08/19 1438 .  amLODipine (NORVASC) tablet 10 mg, 10 mg, Oral, Daily, Elodia Florence., MD, 10 mg at 05/08/19 0806 .  Ampicillin-Sulbactam (UNASYN) 3 g in sodium chloride 0.9 % 100 mL IVPB, 3 g, Intravenous, Q6H, Nazari, Walid A, RPH, Last Rate: 200 mL/hr at 05/08/19 1438, 3 g at 05/08/19 1438 .  aspirin chewable tablet 81 mg, 81 mg, Oral, Daily, Elodia Florence., MD, 81 mg at 05/08/19 0806 .  atorvastatin (LIPITOR) tablet 40 mg, 40 mg, Oral, Daily, Elodia Florence., MD, 40 mg at 05/08/19 0805 .  carvedilol (COREG) tablet 25 mg, 25 mg, Oral, BID WC, Elodia Florence., MD, 25 mg at 05/08/19 1705 .  colchicine tablet 0.6 mg, 0.6 mg,  Oral, Daily, Elodia Florence., MD, 0.6 mg at 05/08/19 0806 .  enoxaparin (LOVENOX) injection 40 mg, 40 mg, Subcutaneous, Q24H, Enzo Bi, MD .  lactated ringers infusion, , Intravenous, Continuous, Elodia Florence., MD, Last Rate: 75 mL/hr at 05/08/19 1439, New Bag at 05/08/19 1439 .  oxyCODONE (Oxy IR/ROXICODONE) immediate release tablet 5 mg, 5 mg, Oral, Q4H PRN, Elodia Florence., MD   Physical exam:  Vitals:   05/08/19 0517 05/08/19 0543 05/08/19 1122 05/08/19 1524  BP: (!) 178/85  125/69 130/74  Pulse: (!) 101  85 78  Resp:  19 16 18   Temp: 98.9 F (37.2 C)  98.6 F (37 C) 98.2 F (36.8 C)  TempSrc: Oral  Oral Oral  SpO2: 97%  97% 95%  Weight: 183 lb 9.6 oz (83.3 kg)     Height:       Physical Exam  Constitutional: No distress.  HENT:  Head: Normocephalic and atraumatic.  Mouth/Throat: No oropharyngeal exudate.  Eyes: Pupils are equal, round, and reactive to light. EOM are normal.  Cardiovascular: Normal rate.  Pulmonary/Chest: Effort normal and breath sounds normal.  Abdominal: Soft.  Musculoskeletal:        General: No edema. Normal range of motion.     Cervical back: Normal range of motion and neck supple.  Neurological: He is alert.  Skin: Skin is warm and dry. No erythema.  Psychiatric: Affect normal.       CMP Latest Ref Rng & Units 05/07/2019  Glucose 70 - 99 mg/dL 118(H)  BUN 8 - 23 mg/dL 16  Creatinine 0.61 - 1.24 mg/dL 1.09  Sodium 135 - 145 mmol/L 139  Potassium 3.5 - 5.1 mmol/L 3.4(L)  Chloride 98 - 111 mmol/L 95(L)  CO2 22 - 32 mmol/L 29  Calcium 8.9 - 10.3 mg/dL 9.3  Total Protein 6.5 - 8.1 g/dL 9.0(H)  Total Bilirubin 0.3 - 1.2 mg/dL 1.6(H)  Alkaline Phos 38 - 126 U/L 68  AST 15 - 41 U/L 22  ALT 0 - 44 U/L 12   CBC Latest Ref Rng & Units 05/08/2019  WBC 4.0 - 10.5 K/uL 13.8(H)  Hemoglobin 13.0 - 17.0 g/dL 10.7(L)  Hematocrit 39.0 - 52.0 % 32.4(L)  Platelets 150 - 400 K/uL 747(H)   RADIOGRAPHIC STUDIES: I have personally  reviewed the radiological images as listed and agreed with the findings in the report.  DG Chest 2 View  Result Date: 05/07/2019 CLINICAL DATA:  Tachycardia EXAM: CHEST - 2 VIEW COMPARISON:  November 20, 2016. FINDINGS: There is ill-defined opacity in the lung bases without consolidation. Heart size and pulmonary vascularity are normal. No adenopathy. There is elevation of the left hemidiaphragm. Visualized bowel appears mildly dilated without appreciable air-fluid  levels. No free air. There are old healed rib fractures on the right. IMPRESSION: 1. Ill-defined opacity in each lung base, concerning for atypical organism pneumonia. No consolidation. Lungs elsewhere clear. 2. Elevation of left hemidiaphragm is stable. Visualized bowel appear somewhat dilated. Question a degree of underlying ileus. 3.  Heart size normal. 4.  No evident adenopathy. Electronically Signed   By: Lowella Grip III M.D.   On: 05/07/2019 12:06   DG Ankle Complete Right  Result Date: 05/07/2019 CLINICAL DATA:  Foot pain EXAM: RIGHT ANKLE - COMPLETE 3+ VIEW COMPARISON:  None. FINDINGS: Alignment is anatomic. No acute fracture. Joint spaces are preserved. Vascular calcification is noted. Metallic fragments overlie the distal fibula. IMPRESSION: No acute osseous abnormality. Electronically Signed   By: Macy Mis M.D.   On: 05/07/2019 13:27   DG Abd 1 View  Result Date: 05/07/2019 CLINICAL DATA:  Bowel obstruction. EXAM: ABDOMEN - 1 VIEW COMPARISON:  None. FINDINGS: There are multiple old healed right-sided rib fractures. There is gaseous distention of loops of colon scattered throughout the abdomen. There is a moderate amount of stool at the level of the rectum. There is no pneumatosis. No definite free air. No evidence for a small bowel obstruction. There are degenerative changes throughout the visualized lumbar spine. 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 IMPRESSION: 1. Gaseous distention of the colon  which may represent a colonic ileus. 2. No evidence for a small bowel obstruction. 3. Moderate amount of stool at the level of the rectum. Electronically Signed   By: Constance Holster M.D.   On: 05/07/2019 21:52   CT Angio Chest PE W and/or Wo Contrast  Result Date: 05/07/2019 CLINICAL DATA:  Pneumonia. EXAM: CT ANGIOGRAPHY CHEST WITH CONTRAST TECHNIQUE: Multidetector CT imaging of the chest was performed using the standard protocol during bolus administration of intravenous contrast. Multiplanar CT image reconstructions and MIPs were obtained to evaluate the vascular anatomy. CONTRAST:  66mL OMNIPAQUE IOHEXOL 350 MG/ML SOLN COMPARISON:  None. FINDINGS: Cardiovascular: Contrast injection is sufficient to demonstrate satisfactory opacification of the pulmonary arteries to the segmental level. There is no pulmonary embolus. The main pulmonary artery is within normal limits for size. There is no CT evidence of acute right heart strain. There are atherosclerotic changes of the visualized aorta without evidence for an aneurysm or dissection. Heart size is borderline enlarged. Coronary artery calcifications are noted. Mediastinum/Nodes: --No mediastinal or hilar lymphadenopathy. --No axillary lymphadenopathy. --No supraclavicular lymphadenopathy. --there is a small left-sided thyroid nodule, for which no further follow-up is required. --The esophagus is unremarkable Lungs/Pleura: There is a moderate amount of debris within the right mainstem bronchus. There is no pneumothorax. No large pleural effusion. No focal infiltrate. There is elevation of the left hemidiaphragm with atelectasis at the left lung base. Upper Abdomen: There are probable nonobstructing stones in the upper pole the left kidney. There is a cyst arising from the upper pole of the right kidney. Musculoskeletal: No chest wall abnormality. No acute or significant osseous findings. There are old healed right-sided rib fractures. Review of the MIP images  confirms the above findings. IMPRESSION: 1. No CT evidence for acute pulmonary embolus. 2. Moderate amount of debris within the right mainstem bronchus, which may indicate aspiration. 3. Elevation of the left hemidiaphragm with atelectasis at the left lung base. No focal infiltrate. 4. Aortic Atherosclerosis (ICD10-I70.0). Electronically Signed   By: Constance Holster M.D.   On: 05/07/2019 16:10   US Venous Img Lower Bilateral (DVT)  Result Date: 05/07/2019  CLINICAL DATA:  Edema EXAM: BILATERAL LOWER EXTREMITY VENOUS DOPPLER ULTRASOUND TECHNIQUE: Gray-scale sonography with compression, as well as color and duplex ultrasound, were performed to evaluate the deep venous system(s) from the level of the common femoral vein through the popliteal and proximal calf veins. COMPARISON:  02/08/2012 FINDINGS: VENOUS Normal compressibility of the common femoral, superficial femoral, and popliteal veins, as well as the visualized calf veins. Visualized portions of profunda femoral vein and great saphenous vein unremarkable. No filling defects to suggest DVT on grayscale or color Doppler imaging. Doppler waveforms show normal direction of venous flow, normal respiratory phasicity and response to augmentation. Calf veins are poorly visualized bilaterally. OTHER None. Limitations: none IMPRESSION: No femoropopliteal DVT nor evidence of DVT within the visualized calf veins. If clinical symptoms are inconsistent or if there are persistent or worsening symptoms, further imaging (possibly involving the iliac veins) may be warranted. Electronically Signed   By: Lavonia Dana M.D.   On: 05/07/2019 18:01   ECHOCARDIOGRAM COMPLETE  Result Date: 05/08/2019    ECHOCARDIOGRAM REPORT   Patient Name:   Marc Smith Date of Exam: 05/08/2019 Medical Rec #:  [id number]       Height:       72.0 in Accession #:    [id number]      Weight:       183.6 lb Date of Birth:  11-Nov-1945        BSA:          2.054 m Patient Age:    74 years        BP:            178/85 mmHg Patient Gender: M               HR:           101 bpm. Exam Location:  [facility] Procedure: 2D Echo, Cardiac Doppler and Color Doppler Indications:     Elevated troponin  History:         Patient has no prior history of Echocardiogram examinations.                  Risk Factors:Hypertension.  Sonographer:     Sherrie Sport RDCS (AE) Referring Phys:  [id number] Alben Deeds JR Diagnosing Phys: Bartholome Bill MD  Sonographer Comments: Technically difficult study due to poor echo windows. IMPRESSIONS  1. Dextrocardia. Left ventricular ejection fraction, by estimation, is 50 to 55%. The left ventricle has low normal function. The left ventricle has no regional wall motion abnormalities. Left ventricular diastolic parameters were normal.  2. Right ventricular systolic function is normal. The right ventricular size is normal.  3. The mitral valve was not well visualized. Trivial mitral valve regurgitation.  4. The aortic valve was not well visualized. Aortic valve regurgitation is not visualized. FINDINGS  Left Ventricle: Dextrocardia. Left ventricular ejection fraction, by estimation, is 50 to 55%. The left ventricle has low normal function. The left ventricle has no regional wall motion abnormalities. The left ventricular internal cavity size was normal  in size. There is borderline left ventricular hypertrophy. Left ventricular diastolic parameters were normal. Right Ventricle: The right ventricular size is normal. No increase in right ventricular wall thickness. Right ventricular systolic function is normal. Left Atrium: Left atrial size was normal in size. Right Atrium: Right atrial size was normal in size. Pericardium: There is no evidence of pericardial effusion. Mitral Valve: The mitral valve was not well visualized. Trivial mitral valve regurgitation. Tricuspid Valve: The  tricuspid valve is not well visualized. Tricuspid valve regurgitation is mild. Aortic Valve: The aortic valve was not well  visualized. Aortic valve regurgitation is not visualized. Pulmonic Valve: The pulmonic valve was not well visualized. Pulmonic valve regurgitation is not visualized. Aorta: The aortic root was not well visualized. IAS/Shunts: The interatrial septum was not assessed.  LEFT VENTRICLE PLAX 2D LVIDd:         4.21 cm LVIDs:         3.29 cm LV PW:         1.34 cm LV IVS:        1.39 cm LVOT diam:     2.20 cm LVOT Area:     3.80 cm  LEFT ATRIUM         Index LA diam:    2.90 cm 1.41 cm/m                        PULMONIC VALVE AORTA                 PV Vmax:        0.90 m/s Ao Root diam: 3.00 cm PV Peak grad:   3.2 mmHg                       RVOT Peak grad: 4 mmHg   SHUNTS Systemic Diam: 2.20 cm Bartholome Bill MD Electronically signed by Bartholome Bill MD Signature Date/Time: 05/08/2019/12:23:08 PM    Final     Assessment and plan-  Patient is a 74 y.o. male presented for right ankle pain. Work-up showed  thrombocytosis and leukocytosis.  #Thrombocytosis, platelet count trended down to 747,000. Likely reactive to infection/inflammation.  Continue monitor. He may have a component of iron insufficient erythropoiesis, as reticulocyte hemoglobin is decreased at 27.8.  Although iron panel showed ferritin of 246, iron saturation 21, TIBC 146, not typical for iron deficiency. If thrombocytosis persists after current infection resolves, I will obtain outpatient myeloproliferative work-up including JAK2 mutations with reflex to MPL/CARL.    #Leukocytosis, predominantly neutrophilia and monocytosis etiology unknown work-up.   Counts are trending down.  Continue to monitor.  #Questionable aspiration pneumonia, procalcitonin not elevated, patient has no fever or respiratory symptoms.  Antibiotics are being discontinued.  Monitor.  Thank you for allowing me to participate in the care of this patient.   Earlie Server, MD, PhD Hematology Oncology [facility] Pager- [phone] 05/08/2019

## 2019-05-08 NOTE — Evaluation (Signed)
Clinical/Bedside Swallow Evaluation Patient Details  Name: Marc Smith MRN: [id number] Date of Birth: 03/04/1945  Today's Date: 05/08/2019 Time: SLP Start Time (ACUTE ONLY): [id number] SLP Stop Time (ACUTE ONLY): 1110 SLP Time Calculation (min) (ACUTE ONLY): 55 min  Past Medical History:  Past Medical History:  Diagnosis Date  . Gout    Per Patient's nephew  . Hyperlipidemia    Patient's nephew  . Hypertension    Past Surgical History: History reviewed. No pertinent surgical history. HPI:  Pt  is a 74 y.o. male with medical history significant of HTN, gout, HLD presenting with RLE pain. History is obtained with assistance from nephew and chart.  Patient notes he's had R ankle pain for about a week.  Suspects it's due to gout.  Describes just as pain.  No other complaints.  No fevers, chest pain, SOB.  Nephew notes he's been sitting in chair for a few days, but couldn't get up due to pain so they brought him to hospital. CXR: "Ill-defined opacity in each lung base, concerning for atypical organism pneumonia. No consolidation. Lungs elsewhere clear".    Assessment / Plan / Recommendation Clinical Impression  Pt appears to present w/ Mild oral phase dysphagia prominent w/ trials of increased textured foods. No overt clinical s/s of aspiration noted during po trials; pt appears at reduced risk for aspiration when following general aspiration precautions. 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 Noted min weakness in hands. Pt positioned upright for po trials; verbal cues given for attention to tasks. Pt consumed trials of thin liquids, purees, and softened solids w/ no overt clinical s/s of aspiration noted; clear vocal quality b/t trials, no decline in  respiratory presentation during/post trials. Oral phase deficits c/b increased oral phase time w/ increased texture, reduced rotary mastication/effort w/ solids, reduced attention to trials of solids during prep. Lingual manipulation and madible movements were reduced though strength and ROM of structures was [facility] during OM exam. Given Time and alternating foods/liquids, pt mashed solids then swallowed and orally cleared trials given. Pt seemed unaware of his lack of mastication of solid foods. Much more timely oral phase management noted w/ purees, thin liquids. Pt fed self w/ setup support. OM exam revealed no unilateral weaknes for labial/lingual/mandible structures. Recommend a more Minced diet consistency for ease of mastication/intake; thin liquids. Recommend general aspiration precautions; support and assistance at meals. Recommend Pills given in Puree for safer swallowing. NSG updated and will reconsult ST services if any new needs arise while admitted.  SLP Visit Diagnosis: Dysphagia, oral phase (R13.11)(Cognitive decline)    Aspiration Risk  Mild aspiration risk;Risk for inadequate nutrition/hydration(reduced following precautions)    Diet Recommendation  Dysphagia level 2 (MINCED foods w/ gravies added to moisten); Thin liquids VIA CUP. General aspiration precautions; support and monitoring at meals for needs. Support w/ feeding as needed d/t weakness.  Check for oral clearing post meals b/f lying down.   Medication Administration: Whole meds with puree(for safer, easier swallowing)    Other  Recommendations Recommended Consults: (Dietician f/u) Oral Care Recommendations: Oral care BID;Oral care before and after PO;Staff/trained caregiver to provide oral care Other Recommendations: (n/a)   Follow up Recommendations None      Frequency and Duration (n/a)  (n/a)  Prognosis Prognosis for Safe Diet Advancement: Fair Barriers to Reach Goals: Cognitive deficits;Time post  onset;Severity of deficits      Swallow Study   General Date of Onset: 05/07/19 HPI: Pt  is a 74 y.o. male with medical history significant of HTN, gout, HLD presenting with RLE pain. History is obtained with assistance from nephew and chart.  Patient notes he's had R ankle pain for about a week.  Suspects it's due to gout.  Describes just as pain.  No other complaints.  No fevers, chest pain, SOB.  Nephew notes he's been sitting in chair for a few days, but couldn't get up due to pain so they brought him to hospital. CXR: "Ill-defined opacity in each lung base, concerning for atypical organism pneumonia. No consolidation. Lungs elsewhere clear".  Type of Study: Bedside Swallow Evaluation Previous Swallow Assessment: none Diet Prior to this Study: Regular;Thin liquids Temperature Spikes Noted: No(wbc 13.8 declining) Respiratory Status: Room air History of Recent Intubation: No Behavior/Cognition: Alert;Cooperative;Pleasant mood;Distractible;Requires cueing Oral Cavity Assessment: Within Functional Limits Oral Care Completed by SLP: Yes Oral Cavity - Dentition: Missing dentition Vision: Functional for self-feeding Self-Feeding Abilities: Able to feed self;Needs assist;Needs set up Patient Positioning: Upright in bed(needed min positioning support) Baseline Vocal Quality: Normal Volitional Cough: (adequate) Volitional Swallow: Able to elicit    Oral/Motor/Sensory Function Overall Oral Motor/Sensory Function: Within functional limits(grossly appeared)   Ice Chips Ice chips: Within functional limits Presentation: Spoon(fed; 2 trials)   Thin Liquid Thin Liquid: Within functional limits Presentation: Cup;Self Fed(~3 ozs total) Other Comments: encouraged single sips; min decreased coordination w/ self-feeding/prep taking the po's    Nectar Thick Nectar Thick Liquid: Not tested   Honey Thick Honey Thick Liquid: Not tested   Puree Puree: Within functional limits Presentation: Spoon(fed; 8  trials)   Solid     Solid: Impaired Presentation: Spoon(fed; 6 trials) Oral Phase Impairments: Impaired mastication;Reduced lingual movement/coordination;Poor awareness of bolus Oral Phase Functional Implications: Prolonged oral transit;Impaired mastication Pharyngeal Phase Impairments: (None) Other Comments: given time, pt cleared. Also alternated foods/liquids to aid clearing.       [name], MS, CCC-SLP Carlton Buskey 05/08/2019,1:45 PM

## 2019-05-08 NOTE — Consult Note (Signed)
ANTICOAGULATION CONSULT NOTE -  Pharmacy Consult for Heparin Drip Indication: ACS/STEMI  No Known Allergies  Patient Measurements: Height: 6' (182.9 cm) Weight: 182 lb 4.8 oz (82.7 kg) IBW/kg (Calculated) : 77.6 Heparin Dosing Weight: 110 kg  Vital Signs: Temp: 98.5 F (36.9 C) (03/17 0041) Temp Source: Oral (03/17 0041) BP: 175/94 (03/17 0041) Pulse Rate: 103 (03/17 0041)  Labs: Recent Labs    05/07/19 1123 05/07/19 1123 05/07/19 1341 05/07/19 1511 05/07/19 1537 05/08/19 0302  HGB 14.0   < >  --   --  10.9* 10.7*  HCT 42.5  --   --   --  32.9* 32.4*  PLT 966*  --   --   --  797* 747*  APTT  --   --   --  38*  --   --   LABPROT 14.3  --   --   --   --   --   INR 1.1  --   --   --   --   --   HEPARINUNFRC  --   --   --   --   --  0.10*  CREATININE 1.09  --   --   --   --   --   TROPONINIHS 108*  --  340*  --  769*  --    < > = values in this interval not displayed.    Estimated Creatinine Clearance: 66.2 mL/min (by C-G formula based on SCr of 1.09 mg/dL).   Medical History: Past Medical History:  Diagnosis Date  . Gout    Per Patient's nephew  . Hyperlipidemia    Patient's nephew  . Hypertension     Medications:  No pta anticoagulation of record  Assessment: 74 y.o. male with a history of gout, hyperlipidemia, hypertension, UTI who presents to the ED for right foot pain.  Patient states he has history of gout.  Of note his heart rate was noted to be 150 on arrival.  Pt now with troponins trending up - pharmacy has been consulted to initiate and monitor heparin drip.  Goal of Therapy:  Heparin level 0.3-0.7 units/ml Monitor platelets by anticoagulation protocol: Yes   Plan:  Give 4000 units bolus x 1 Start heparin infusion at 1500 units/hr Check anti-Xa level in 8 hours and daily while on heparin Continue to monitor H&H and platelets  0317 0302 HL 0.10, subtherapeutic.  Will rebolus w/ 2000 units x 1 and increase infusion to 1700 units/hr.  Will  recheck HL ~ 8 hours after rate increased.    Ena Dawley, PharmD Clinical Pharmacist 05/08/2019 3:43 AM

## 2019-05-08 NOTE — Progress Notes (Signed)
PROGRESS NOTE    Marc Smith  [id number] DOB: 1945-03-04 DOA: 05/07/2019 PCP: Lavera Guise, MD    Assessment & Plan:   Active Problems:   Tachycardia   Elevated troponin I level   Acute pain of right lower extremity    Marc Smith is a 74 y.o. AA male with medical history significant of HTN, gout, HLD who presented with RLE pain.   Aspiration Pneumonia, ruled out CT notable for debris in R mainstem, however, procal not elevated, and pt not febrile or hypoxic.   PLAN: --SLP today, noted no overt clinical s/s of aspiration, recommended Dysphagia level 2, Thin liquids VIA CUP.  --d/c abx  Elevated Troponin:  suspect this is demand.  Trop peaked at 9. --cardiology consulted, recommended d/c heparin and no further ischemic workup.  Sinus tachycardia --continue coreg 25 mg BID   Right Ankle Pain  Right Lower Extremity Swelling:  pt suspected this may be gout,  -- Ankle plain film wnl. --LE [place] (negative for DVT) --Continue allopurinol and colchicine  Concern for Ileus on CXR:  KUB showed mod stool in the rectum and colonic distention.  Pt noted to be incontinent of liquid stool today. --repeat KUB today.  Thrombocytosis: c/s heme, suspect reactive from above?  Hypertension:  continue amlodipine, coreg.   Hold lisinopril for now.  HLD: continue atorvastatin  Not taking zoloft per nephew   DVT prophylaxis: Lovenox SQ Code Status: Full code  Family Communication:  Disposition Plan: SNF   Subjective and Interval History:  Pt reported his right foot pain a bit better and could now put some weight on it while working with PT.  Also ate some food.  No other complaints, however, was noted to sit in his own liquid stool without noticing it at all.  No noted fever, N/V.   Objective: Vitals:   05/08/19 0517 05/08/19 0543 05/08/19 1122 05/08/19 1524  BP: (!) 178/85  125/69 130/74  Pulse: (!) 101  85 78  Resp:  19 16 18   Temp: 98.9 F (37.2 C)   98.6 F (37 C) 98.2 F (36.8 C)  TempSrc: Oral  Oral Oral  SpO2: 97%  97% 95%  Weight: 83.3 kg     Height:        Intake/Output Summary (Last 24 hours) at 05/08/2019 1621 Last data filed at 05/08/2019 1523 Gross per 24 hour  Intake 2253.7 ml  Output 800 ml  Net 1453.7 ml   Filed Weights   05/07/19 1108 05/08/19 0053 05/08/19 0517  Weight: 127 kg 82.7 kg 83.3 kg    Examination:   Constitutional: NAD, alert, oriented, however, seemed very confused at times and unaware  HEENT: conjunctivae and lids normal, EOMI CV: RRR no M,R,G. Distal pulses +2.  No cyanosis.   RESP: rhonchi, normal respiratory effort  GI: +BS, NTND, sitting in his own liquid stool Extremities: No effusions, edema, or tenderness in BLE SKIN: warm, dry and intact Neuro: II - XII grossly intact.     Data Reviewed: I have personally reviewed following labs and imaging studies  CBC: Recent Labs  Lab 05/07/19 1123 05/07/19 1537 05/08/19 0302  WBC 15.0* 15.4* 13.8*  NEUTROABS 12.1* 12.0*  --   HGB 14.0 10.9* 10.7*  HCT 42.5 32.9* 32.4*  MCV 84.8 85.0 85.0  PLT 966* 797* 123456*   Basic Metabolic Panel: Recent Labs  Lab 05/07/19 1123  NA 139  K 3.4*  CL 95*  CO2 29  GLUCOSE 118*  BUN  16  CREATININE 1.09  CALCIUM 9.3   GFR: Estimated Creatinine Clearance: 66.2 mL/min (by C-G formula based on SCr of 1.09 mg/dL). Liver Function Tests: Recent Labs  Lab 05/07/19 1123  AST 22  ALT 12  ALKPHOS 68  BILITOT 1.6*  PROT 9.0*  ALBUMIN 3.6   No results for input(s): LIPASE, AMYLASE in the last 168 hours. No results for input(s): AMMONIA in the last 168 hours. Coagulation Profile: Recent Labs  Lab 05/07/19 1123  INR 1.1   Cardiac Enzymes: No results for input(s): CKTOTAL, CKMB, CKMBINDEX, TROPONINI in the last 168 hours. BNP (last 3 results) No results for input(s): PROBNP in the last 8760 hours. HbA1C: Recent Labs    05/08/19 0302  HGBA1C 5.5   CBG: No results for input(s): GLUCAP in  the last 168 hours. Lipid Profile: Recent Labs    05/08/19 0302  CHOL 124  HDL 33*  LDLCALC 81  TRIG 52  CHOLHDL 3.8   Thyroid Function Tests: No results for input(s): TSH, T4TOTAL, FREET4, T3FREE, THYROIDAB in the last 72 hours. Anemia Panel: Recent Labs    05/08/19 0302  FERRITIN 246  TIBC 146*  IRON 31*  RETICCTPCT 1.2   Sepsis Labs: Recent Labs  Lab 05/07/19 1123 05/07/19 1341 05/08/19 0827  PROCALCITON  --   --  0.12  LATICACIDVEN 2.2* 2.3* 1.1    Recent Results (from the past 240 hour(s))  Culture, blood (Routine x 2)     Status: None (Preliminary result)   Collection Time: 05/07/19 11:23 AM   Specimen: BLOOD  Result Value Ref Range Status   Specimen Description BLOOD RIGHT ANTECUBITAL  Final   Special Requests   Final    BOTTLES DRAWN AEROBIC AND ANAEROBIC Blood Culture adequate volume   Culture   Final    NO GROWTH < 24 HOURS Performed at [facility], [address], [place], [place] [address]    Report Status PENDING  Incomplete  Culture, blood (Routine x 2)     Status: None (Preliminary result)   Collection Time: 05/07/19 11:23 AM   Specimen: BLOOD  Result Value Ref Range Status   Specimen Description BLOOD BLOOD RIGHT HAND  Final   Special Requests   Final    BOTTLES DRAWN AEROBIC AND ANAEROBIC Blood Culture adequate volume   Culture   Final    NO GROWTH < 24 HOURS Performed at [facility], [address], [place], [place] [address]    Report Status PENDING  Incomplete  Respiratory Panel by RT PCR (Flu A&B, Covid) - Nasopharyngeal Swab     Status: None   Collection Time: 05/07/19  1:52 PM   Specimen: Nasopharyngeal Swab  Result Value Ref Range Status   SARS Coronavirus 2 by RT PCR NEGATIVE NEGATIVE Final    Comment: (NOTE) SARS-CoV-2 target nucleic acids are NOT DETECTED. The SARS-CoV-2 RNA is generally detectable in upper respiratoy specimens during the acute phase of infection. The lowest concentration of SARS-CoV-2  viral copies this assay can detect is 131 copies/mL. A negative result does not preclude SARS-Cov-2 infection and should not be used as the sole basis for treatment or other patient management decisions. A negative result may occur with  improper specimen collection/handling, submission of specimen other than nasopharyngeal swab, presence of viral mutation(s) within the areas targeted by this assay, and inadequate number of viral copies (<131 copies/mL). A negative result must be combined with clinical observations, patient history, and epidemiological information. The expected result is Negative.  Fact Sheet for Patients:  PinkCheek.be Fact Sheet for Healthcare Providers:  GravelBags.it This test is not yet ap proved or cleared by the [place] FDA and  has been authorized for detection and/or diagnosis of SARS-CoV-2 by FDA under an Emergency Use Authorization (EUA). This EUA will remain  in effect (meaning this test can be used) for the duration of the COVID-19 declaration under Section 564(b)(1) of the Act, 21 U.S.C. section 360bbb-3(b)(1), unless the authorization is terminated or revoked sooner.    Influenza A by PCR NEGATIVE NEGATIVE Final   Influenza B by PCR NEGATIVE NEGATIVE Final    Comment: (NOTE) The Xpert Xpress SARS-CoV-2/FLU/RSV assay is intended as an aid in  the diagnosis of influenza from Nasopharyngeal swab specimens and  should not be used as a sole basis for treatment. Nasal washings and  aspirates are unacceptable for Xpert Xpress SARS-CoV-2/FLU/RSV  testing. Fact Sheet for Patients: PinkCheek.be Fact Sheet for Healthcare Providers: GravelBags.it This test is not yet approved or cleared by the [place] FDA and  has been authorized for detection and/or diagnosis of SARS-CoV-2 by  FDA under an Emergency Use Authorization (EUA). This EUA will  remain  in effect (meaning this test can be used) for the duration of the  Covid-19 declaration under Section 564(b)(1) of the Act, 21  U.S.C. section 360bbb-3(b)(1), unless the authorization is  terminated or revoked. Performed at [facility], [address], [place], [place] [address]       Radiology Studies: DG Chest 2 View  Result Date: 05/07/2019 CLINICAL DATA:  Tachycardia EXAM: CHEST - 2 VIEW COMPARISON:  November 20, 2016. FINDINGS: There is ill-defined opacity in the lung bases without consolidation. Heart size and pulmonary vascularity are normal. No adenopathy. There is elevation of the left hemidiaphragm. Visualized bowel appears mildly dilated without appreciable air-fluid levels. No free air. There are old healed rib fractures on the right. IMPRESSION: 1. Ill-defined opacity in each lung base, concerning for atypical organism pneumonia. No consolidation. Lungs elsewhere clear. 2. Elevation of left hemidiaphragm is stable. Visualized bowel appear somewhat dilated. Question a degree of underlying ileus. 3.  Heart size normal. 4.  No evident adenopathy. Electronically Signed   By: Lowella Grip III M.D.   On: 05/07/2019 12:06   DG Ankle Complete Right  Result Date: 05/07/2019 CLINICAL DATA:  Foot pain EXAM: RIGHT ANKLE - COMPLETE 3+ VIEW COMPARISON:  None. FINDINGS: Alignment is anatomic. No acute fracture. Joint spaces are preserved. Vascular calcification is noted. Metallic fragments overlie the distal fibula. IMPRESSION: No acute osseous abnormality. Electronically Signed   By: Macy Mis M.D.   On: 05/07/2019 13:27   DG Abd 1 View  Result Date: 05/07/2019 CLINICAL DATA:  Bowel obstruction. EXAM: ABDOMEN - 1 VIEW COMPARISON:  None. FINDINGS: There are multiple old healed right-sided rib fractures. There is gaseous distention of loops of colon scattered throughout the abdomen. There is a moderate amount of stool at the level of the rectum. There is no  pneumatosis. No definite free air. No evidence for a small bowel obstruction. There are degenerative changes throughout the visualized lumbar spine. 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 IMPRESSION: 1. Gaseous distention of the colon which may represent a colonic ileus. 2. No evidence for a small bowel obstruction. 3. Moderate amount of stool at the level of the rectum. Electronically Signed   By: Constance Holster M.D.   On: 05/07/2019 21:52   CT Angio Chest PE W and/or Wo Contrast  Result  Date: 05/07/2019 CLINICAL DATA:  Pneumonia. EXAM: CT ANGIOGRAPHY CHEST WITH CONTRAST TECHNIQUE: Multidetector CT imaging of the chest was performed using the standard protocol during bolus administration of intravenous contrast. Multiplanar CT image reconstructions and MIPs were obtained to evaluate the vascular anatomy. CONTRAST:  71mL OMNIPAQUE IOHEXOL 350 MG/ML SOLN COMPARISON:  None. FINDINGS: Cardiovascular: Contrast injection is sufficient to demonstrate satisfactory opacification of the pulmonary arteries to the segmental level. There is no pulmonary embolus. The main pulmonary artery is within normal limits for size. There is no CT evidence of acute right heart strain. There are atherosclerotic changes of the visualized aorta without evidence for an aneurysm or dissection. Heart size is borderline enlarged. Coronary artery calcifications are noted. Mediastinum/Nodes: --No mediastinal or hilar lymphadenopathy. --No axillary lymphadenopathy. --No supraclavicular lymphadenopathy. --there is a small left-sided thyroid nodule, for which no further follow-up is required. --The esophagus is unremarkable Lungs/Pleura: There is a moderate amount of debris within the right mainstem bronchus. There is no pneumothorax. No large pleural effusion. No focal infiltrate. There is elevation of the left hemidiaphragm with atelectasis at the left lung base. Upper Abdomen: There are probable nonobstructing stones in the  upper pole the left kidney. There is a cyst arising from the upper pole of the right kidney. Musculoskeletal: No chest wall abnormality. No acute or significant osseous findings. There are old healed right-sided rib fractures. Review of the MIP images confirms the above findings. IMPRESSION: 1. No CT evidence for acute pulmonary embolus. 2. Moderate amount of debris within the right mainstem bronchus, which may indicate aspiration. 3. Elevation of the left hemidiaphragm with atelectasis at the left lung base. No focal infiltrate. 4. Aortic Atherosclerosis (ICD10-I70.0). Electronically Signed   By: Constance Holster M.D.   On: 05/07/2019 16:10   US Venous Img Lower Bilateral (DVT)  Result Date: 05/07/2019 CLINICAL DATA:  Edema EXAM: BILATERAL LOWER EXTREMITY VENOUS DOPPLER ULTRASOUND TECHNIQUE: Gray-scale sonography with compression, as well as color and duplex ultrasound, were performed to evaluate the deep venous system(s) from the level of the common femoral vein through the popliteal and proximal calf veins. COMPARISON:  02/08/2012 FINDINGS: VENOUS Normal compressibility of the common femoral, superficial femoral, and popliteal veins, as well as the visualized calf veins. Visualized portions of profunda femoral vein and great saphenous vein unremarkable. No filling defects to suggest DVT on grayscale or color Doppler imaging. Doppler waveforms show normal direction of venous flow, normal respiratory phasicity and response to augmentation. Calf veins are poorly visualized bilaterally. OTHER None. Limitations: none IMPRESSION: No femoropopliteal DVT nor evidence of DVT within the visualized calf veins. If clinical symptoms are inconsistent or if there are persistent or worsening symptoms, further imaging (possibly involving the iliac veins) may be warranted. Electronically Signed   By: Lavonia Dana M.D.   On: 05/07/2019 18:01   ECHOCARDIOGRAM COMPLETE  Result Date: 05/08/2019    ECHOCARDIOGRAM REPORT    Patient Name:   Marc Smith Date of Exam: 05/08/2019 Medical Rec #:  [id number]       Height:       72.0 in Accession #:    [id number]      Weight:       183.6 lb Date of Birth:  [birth year]        BSA:          2.054 m Patient Age:    22 years        BP:           178/85 mmHg  Patient Gender: M               HR:           101 bpm. Exam Location:  [facility] Procedure: 2D Echo, Cardiac Doppler and Color Doppler Indications:     Elevated troponin  History:         Patient has no prior history of Echocardiogram examinations.                  Risk Factors:Hypertension.  Sonographer:     Sherrie Sport RDCS (AE) Referring Phys:  [id number] Alben Deeds JR Diagnosing Phys: Bartholome Bill MD  Sonographer Comments: Technically difficult study due to poor echo windows. IMPRESSIONS  1. Dextrocardia. Left ventricular ejection fraction, by estimation, is 50 to 55%. The left ventricle has low normal function. The left ventricle has no regional wall motion abnormalities. Left ventricular diastolic parameters were normal.  2. Right ventricular systolic function is normal. The right ventricular size is normal.  3. The mitral valve was not well visualized. Trivial mitral valve regurgitation.  4. The aortic valve was not well visualized. Aortic valve regurgitation is not visualized. FINDINGS  Left Ventricle: Dextrocardia. Left ventricular ejection fraction, by estimation, is 50 to 55%. The left ventricle has low normal function. The left ventricle has no regional wall motion abnormalities. The left ventricular internal cavity size was normal  in size. There is borderline left ventricular hypertrophy. Left ventricular diastolic parameters were normal. Right Ventricle: The right ventricular size is normal. No increase in right ventricular wall thickness. Right ventricular systolic function is normal. Left Atrium: Left atrial size was normal in size. Right Atrium: Right atrial size was normal in size. Pericardium: There is no evidence of  pericardial effusion. Mitral Valve: The mitral valve was not well visualized. Trivial mitral valve regurgitation. Tricuspid Valve: The tricuspid valve is not well visualized. Tricuspid valve regurgitation is mild. Aortic Valve: The aortic valve was not well visualized. Aortic valve regurgitation is not visualized. Pulmonic Valve: The pulmonic valve was not well visualized. Pulmonic valve regurgitation is not visualized. Aorta: The aortic root was not well visualized. IAS/Shunts: The interatrial septum was not assessed.  LEFT VENTRICLE PLAX 2D LVIDd:         4.21 cm LVIDs:         3.29 cm LV PW:         1.34 cm LV IVS:        1.39 cm LVOT diam:     2.20 cm LVOT Area:     3.80 cm  LEFT ATRIUM         Index LA diam:    2.90 cm 1.41 cm/m                        PULMONIC VALVE AORTA                 PV Vmax:        0.90 m/s Ao Root diam: 3.00 cm PV Peak grad:   3.2 mmHg                       RVOT Peak grad: 4 mmHg   SHUNTS Systemic Diam: 2.20 cm Bartholome Bill MD Electronically signed by Bartholome Bill MD Signature Date/Time: 05/08/2019/12:23:08 PM    Final      Scheduled Meds: . allopurinol  100 mg Oral Daily  . amLODipine  10 mg Oral Daily  . aspirin  81 mg Oral Daily  . atorvastatin  40 mg Oral Daily  . carvedilol  25 mg Oral BID WC  . colchicine  0.6 mg Oral Daily  . enoxaparin (LOVENOX) injection  40 mg Subcutaneous Q24H   Continuous Infusions: . ampicillin-sulbactam (UNASYN) IV 3 g (05/08/19 1438)  . lactated ringers 75 mL/hr at 05/08/19 1439     LOS: 0 days     Enzo Bi, MD [facility] If 7PM-7AM, please contact night-coverage 05/08/2019, 4:21 PM

## 2019-05-08 NOTE — Plan of Care (Signed)
  Problem: Education: Goal: Knowledge of [facility] Education information/materials will improve Outcome: Progressing Goal: Emotional status will improve Outcome: Progressing Goal: Mental status will improve Outcome: Progressing Goal: Verbalization of understanding the information provided will improve Outcome: Progressing   Problem: Activity: Goal: Interest or engagement in activities will improve Outcome: Progressing Goal: Sleeping patterns will improve Outcome: Progressing   

## 2019-05-08 NOTE — Consult Note (Signed)
Pharmacy Antibiotic Note  Marc Smith is a 74 y.o. male admitted on 05/07/2019 with aspiration pneumonia. CXR with impression of "Ill-defined opacity in each lung base, concerning for atypical organism pneumonia." Pharmacy has been consulted for Unasyn dosing.  Day 2 antibiotics. Afebrile x 24h. WBC 15.4 >> 13.8. PCT 0.12.   Plan: Unasyn 3g IV Q6 hours  Height: 6' (182.9 cm) Weight: 183 lb 9.6 oz (83.3 kg) IBW/kg (Calculated) : 77.6  Temp (24hrs), Avg:98.6 F (37 C), Min:98.2 F (36.8 C), Max:98.9 F (37.2 C)  Recent Labs  Lab 05/07/19 1123 05/07/19 1341 05/07/19 1537 05/08/19 0302 05/08/19 0827  WBC 15.0*  --  15.4* 13.8*  --   CREATININE 1.09  --   --   --   --   LATICACIDVEN 2.2* 2.3*  --   --  1.1    Estimated Creatinine Clearance: 66.2 mL/min (by C-G formula based on SCr of 1.09 mg/dL).    No Known Allergies  Antimicrobials this admission: Levaquin 3/16 x 1 Unasyn 3/16 >>   Microbiology results: 3/17 C Diff: pending 3/16 BCx: NG < 24h 3/16 MRSA PCR: pending  Thank you for allowing pharmacy to be a part of this patient's care.  [place] Resident 05/08/2019 3:47 PM

## 2019-05-08 NOTE — Progress Notes (Signed)
Physical Therapy Evaluation Patient Details Name: Marc Smith MRN: [id number] DOB: [birth year] Today's Date: 05/08/2019   History of Present Illness  per MD: Marc Smith is a 74 y.o. male with medical history significant of HTN, gout, HLD presentine with RLE pain.  Clinical Impression  Patient agrees to PT evaluation. He reports that he doesn't know how much pain his foot is in until he tires to step on it. He has -3/5 strength BLE hips, 3/5 B knee ext . He needs min assist for supine to sit bed mobility and mod assist for sit to supine bed mobility. He is able to transfer sit to stand with RW and mod assist with VC for safety and sequencing. Patient is hesitant to bear weight on R foot due to pain. He is not able to ambulate today due to right foot pain in standing. He will continue to benefit from skilled PT to improve mobility and strength.     Follow Up Recommendations SNF    Equipment Recommendations  Rolling walker with 5" wheels    Recommendations for Other Services       Precautions / Restrictions        Mobility  Bed Mobility Overal bed mobility: Needs Assistance Bed Mobility: Supine to Sit;Sit to Supine           General bed mobility comments: needs VC for safety  Transfers Overall transfer level: Needs assistance Equipment used: Rolling walker (2 wheeled) Transfers: Sit to/from Stand Sit to Stand: Min assist(unable to put weight on R foot due to pain)         General transfer comment: VC for safety  Ambulation/Gait                Stairs            Wheelchair Mobility    Modified Rankin (Stroke Patients Only)       Balance Overall balance assessment: Modified Independent                                           Pertinent Vitals/Pain Pain Assessment: 0-10 Pain Score: 5  Pain Location: (right LE)    Home Living Family/patient expects to be discharged to:: Private residence Living Arrangements: Other  relatives Available Help at Discharge: Family Type of Home: House Home Access: Stairs to enter Entrance Stairs-Rails: Right Entrance Stairs-Number of Steps: 3          Prior Function                 Hand Dominance        Extremity/Trunk Assessment        Lower Extremity Assessment Lower Extremity Assessment: Generalized weakness       Communication   Communication: No difficulties  Cognition Arousal/Alertness: Awake/alert                                            General Comments      Exercises General Exercises - Lower Extremity Ankle Circles/Pumps: AROM Short Arc Quad: AROM Long Arc Quad: AROM Heel Slides: AROM Hip ABduction/ADduction: Strengthening;Right;Left Straight Leg Raises: AROM   Assessment/Plan    PT Assessment Patient needs continued PT services  PT Problem List Decreased strength;Decreased activity tolerance;Decreased balance;Decreased mobility  PT Treatment Interventions Gait training;Therapeutic activities;Therapeutic exercise    PT Goals (Current goals can be found in the Care Plan section)  Acute Rehab PT Goals Patient Stated Goal: to go home PT Goal Formulation: Patient unable to participate in goal setting Time For Goal Achievement: 05/22/19 Potential to Achieve Goals: Good    Frequency Min 2X/week   Barriers to discharge        Co-evaluation               AM-PAC PT "6 Clicks" Mobility  Outcome Measure Help needed turning from your back to your side while in a flat bed without using bedrails?: A Little Help needed moving from lying on your back to sitting on the side of a flat bed without using bedrails?: A Little         6 Click Score: 6    End of Session Equipment Utilized During Treatment: Gait belt Activity Tolerance: Patient limited by pain Patient left: in bed;with bed alarm set Nurse Communication: Mobility status PT Visit Diagnosis: Muscle weakness (generalized)  (M62.81);Difficulty in walking, not elsewhere classified (R26.2)    Time: [id number] PT Time Calculation (min) (ACUTE ONLY): 25 min   Charges:   PT Evaluation $PT Eval Low Complexity: 1 Low PT Treatments $Therapeutic Activity: 8-22 mins          Alanson Puls, PT DPT 05/08/2019, 11:57 AM

## 2019-05-08 NOTE — Progress Notes (Signed)
*  PRELIMINARY RESULTS* Echocardiogram 2D Echocardiogram has been performed.  Marc Smith 05/08/2019, 10:08 AM

## 2019-05-08 NOTE — Progress Notes (Signed)
[facility] Cardiology    SUBJECTIVE: Marc Smith is a 74 year old male with a past medical history significant for hypertension, hyperlipidemia, and gout who presented to the ED on 05/07/19 for right foot pain.  He was noted to be tachycardic on arrival with ECG revealing sinus tachycardia at a ventricular rate of 147bpm. Repeat ECG revealing no significant interval change.  High sensitivity troponin was elevated x 3 at 108, 340, 769 respectively. CBC revealed a WBC of 15 and lactic acid was 2.2.  Chest xray revealed an ill defined opacity in bilateral lung bases, concerning for atypical pneumonia; chest CT negative for PE.   Today, Marc Smith appears more alert and reports significant clinical improvement from yesterday.  He continues to deny chest pain or chest pressure.  He denies palpitations or heart racing sensation.  He denies shortness of breath, lower extremity swelling, orthopnea, or PND.  He denies syncopal/presyncopal episodes.    Vitals:   05/08/19 0053 05/08/19 0120 05/08/19 0517 05/08/19 0543  BP:   (!) 178/85   Pulse:   (!) 101   Resp:  20  19  Temp:   98.9 F (37.2 C)   TempSrc:   Oral   SpO2:   97%   Weight: 82.7 kg  83.3 kg   Height: 6' (1.829 m)        Intake/Output Summary (Last 24 hours) at 05/08/2019 0747 Last data filed at 05/08/2019 0536 Gross per 24 hour  Intake 1403.55 ml  Output 500 ml  Net 903.55 ml      PHYSICAL EXAM  General: Well developed, well nourished, in no acute distress HEENT:  Normocephalic and atramatic Neck:  No JVD.  Lungs: Clear bilaterally to auscultation and percussion. Heart: HRRR . Normal S1 and S2 without gallops or murmurs.  Abdomen: Bowel sounds are positive, abdomen soft and non-tender  Msk:  Back normal. Normal strength and tone for age. Extremities: No clubbing, cyanosis or edema.   Neuro: Alert and oriented X 3. Psych:  Good affect, responds appropriately   LABS: Basic Metabolic Panel: Recent Labs    05/07/19 1123  NA 139   K 3.4*  CL 95*  CO2 29  GLUCOSE 118*  BUN 16  CREATININE 1.09  CALCIUM 9.3   Liver Function Tests: Recent Labs    05/07/19 1123  AST 22  ALT 12  ALKPHOS 68  BILITOT 1.6*  PROT 9.0*  ALBUMIN 3.6   No results for input(s): LIPASE, AMYLASE in the last 72 hours. CBC: Recent Labs    05/07/19 1123 05/07/19 1123 05/07/19 1537 05/08/19 0302  WBC 15.0*   < > 15.4* 13.8*  NEUTROABS 12.1*  --  12.0*  --   HGB 14.0   < > 10.9* 10.7*  HCT 42.5   < > 32.9* 32.4*  MCV 84.8   < > 85.0 85.0  PLT 966*   < > 797* 747*   < > = values in this interval not displayed.   Cardiac Enzymes: No results for input(s): CKTOTAL, CKMB, CKMBINDEX, TROPONINI in the last 72 hours. BNP: Invalid input(s): POCBNP D-Dimer: No results for input(s): DDIMER in the last 72 hours. Hemoglobin A1C: No results for input(s): HGBA1C in the last 72 hours. Fasting Lipid Panel: Recent Labs    05/08/19 0302  CHOL 124  HDL 33*  LDLCALC 81  TRIG 52  CHOLHDL 3.8   Thyroid Function Tests: No results for input(s): TSH, T4TOTAL, T3FREE, THYROIDAB in the last 72 hours.  Invalid input(s):  FREET3 Anemia Panel: Recent Labs    05/08/19 0302  FERRITIN 246  TIBC 146*  IRON 31*  RETICCTPCT 1.2    DG Chest 2 View  Result Date: 05/07/2019 CLINICAL DATA:  Tachycardia EXAM: CHEST - 2 VIEW COMPARISON:  November 20, 2016. FINDINGS: There is ill-defined opacity in the lung bases without consolidation. Heart size and pulmonary vascularity are normal. No adenopathy. There is elevation of the left hemidiaphragm. Visualized bowel appears mildly dilated without appreciable air-fluid levels. No free air. There are old healed rib fractures on the right. IMPRESSION: 1. Ill-defined opacity in each lung base, concerning for atypical organism pneumonia. No consolidation. Lungs elsewhere clear. 2. Elevation of left hemidiaphragm is stable. Visualized bowel appear somewhat dilated. Question a degree of underlying ileus. 3.  Heart  size normal. 4.  No evident adenopathy. Electronically Signed   By: Lowella Grip III M.D.   On: 05/07/2019 12:06   DG Ankle Complete Right  Result Date: 05/07/2019 CLINICAL DATA:  Foot pain EXAM: RIGHT ANKLE - COMPLETE 3+ VIEW COMPARISON:  None. FINDINGS: Alignment is anatomic. No acute fracture. Joint spaces are preserved. Vascular calcification is noted. Metallic fragments overlie the distal fibula. IMPRESSION: No acute osseous abnormality. Electronically Signed   By: Macy Mis M.D.   On: 05/07/2019 13:27   DG Abd 1 View  Result Date: 05/07/2019 CLINICAL DATA:  Bowel obstruction. EXAM: ABDOMEN - 1 VIEW COMPARISON:  None. FINDINGS: There are multiple old healed right-sided rib fractures. There is gaseous distention of loops of colon scattered throughout the abdomen. There is a moderate amount of stool at the level of the rectum. There is no pneumatosis. No definite free air. No evidence for a small bowel obstruction. There are degenerative changes throughout the visualized lumbar spine. 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 IMPRESSION: 1. Gaseous distention of the colon which may represent a colonic ileus. 2. No evidence for a small bowel obstruction. 3. Moderate amount of stool at the level of the rectum. Electronically Signed   By: Constance Holster M.D.   On: 05/07/2019 21:52   CT Angio Chest PE W and/or Wo Contrast  Result Date: 05/07/2019 CLINICAL DATA:  Pneumonia. EXAM: CT ANGIOGRAPHY CHEST WITH CONTRAST TECHNIQUE: Multidetector CT imaging of the chest was performed using the standard protocol during bolus administration of intravenous contrast. Multiplanar CT image reconstructions and MIPs were obtained to evaluate the vascular anatomy. CONTRAST:  54mL OMNIPAQUE IOHEXOL 350 MG/ML SOLN COMPARISON:  None. FINDINGS: Cardiovascular: Contrast injection is sufficient to demonstrate satisfactory opacification of the pulmonary arteries to the segmental level. There is no  pulmonary embolus. The main pulmonary artery is within normal limits for size. There is no CT evidence of acute right heart strain. There are atherosclerotic changes of the visualized aorta without evidence for an aneurysm or dissection. Heart size is borderline enlarged. Coronary artery calcifications are noted. Mediastinum/Nodes: --No mediastinal or hilar lymphadenopathy. --No axillary lymphadenopathy. --No supraclavicular lymphadenopathy. --there is a small left-sided thyroid nodule, for which no further follow-up is required. --The esophagus is unremarkable Lungs/Pleura: There is a moderate amount of debris within the right mainstem bronchus. There is no pneumothorax. No large pleural effusion. No focal infiltrate. There is elevation of the left hemidiaphragm with atelectasis at the left lung base. Upper Abdomen: There are probable nonobstructing stones in the upper pole the left kidney. There is a cyst arising from the upper pole of the right kidney. Musculoskeletal: No chest wall abnormality. No acute or significant osseous findings. There are old  healed right-sided rib fractures. Review of the MIP images confirms the above findings. IMPRESSION: 1. No CT evidence for acute pulmonary embolus. 2. Moderate amount of debris within the right mainstem bronchus, which may indicate aspiration. 3. Elevation of the left hemidiaphragm with atelectasis at the left lung base. No focal infiltrate. 4. Aortic Atherosclerosis (ICD10-I70.0). Electronically Signed   By: Constance Holster M.D.   On: 05/07/2019 16:10   US Venous Img Lower Bilateral (DVT)  Result Date: 05/07/2019 CLINICAL DATA:  Edema EXAM: BILATERAL LOWER EXTREMITY VENOUS DOPPLER ULTRASOUND TECHNIQUE: Gray-scale sonography with compression, as well as color and duplex ultrasound, were performed to evaluate the deep venous system(s) from the level of the common femoral vein through the popliteal and proximal calf veins. COMPARISON:  02/08/2012 FINDINGS: VENOUS  Normal compressibility of the common femoral, superficial femoral, and popliteal veins, as well as the visualized calf veins. Visualized portions of profunda femoral vein and great saphenous vein unremarkable. No filling defects to suggest DVT on grayscale or color Doppler imaging. Doppler waveforms show normal direction of venous flow, normal respiratory phasicity and response to augmentation. Calf veins are poorly visualized bilaterally. OTHER None. Limitations: none IMPRESSION: No femoropopliteal DVT nor evidence of DVT within the visualized calf veins. If clinical symptoms are inconsistent or if there are persistent or worsening symptoms, further imaging (possibly involving the iliac veins) may be warranted. Electronically Signed   By: Lavonia Dana M.D.   On: 05/07/2019 18:01     Echo: Pending   TELEMETRY: Sinus tachycardia   ASSESSMENT AND PLAN:  Active Problems:   Tachycardia   Elevated troponin I level    1.  Sinus tachycardia   -Improved, rate now in the 90s on carvedilol 25mg  BID   2.  Elevated troponin   -Likely demand ischemia in the setting of tachycardia, pneumonia   -Will d/c heparin   -Patient continues to deny chest pain; further ischemic workup not indicated at this time   3.  Hypertension   -Continue carvedilol 25mg  BID and amlodipine 10mg  daily   The history, physical exam findings, and plan of care were all discussed with Dr. Bartholome Bill, and all decision making was made in collaboration.   Avie Arenas  PA-C 05/08/2019 7:47 AM

## 2019-05-09 DIAGNOSIS — R5381 Other malaise: Secondary | ICD-10-CM

## 2019-05-09 LAB — CBC
HCT: 28.5 % — ABNORMAL LOW (ref 39.0–52.0)
Hemoglobin: 9.3 g/dL — ABNORMAL LOW (ref 13.0–17.0)
MCH: 28 pg (ref 26.0–34.0)
MCHC: 32.6 g/dL (ref 30.0–36.0)
MCV: 85.8 fL (ref 80.0–100.0)
Platelets: 681 10*3/uL — ABNORMAL HIGH (ref 150–400)
RBC: 3.32 MIL/uL — ABNORMAL LOW (ref 4.22–5.81)
RDW: 14.1 % (ref 11.5–15.5)
WBC: 8.4 10*3/uL (ref 4.0–10.5)
nRBC: 0 % (ref 0.0–0.2)

## 2019-05-09 LAB — BASIC METABOLIC PANEL
Anion gap: 12 (ref 5–15)
BUN: 16 mg/dL (ref 8–23)
CO2: 28 mmol/L (ref 22–32)
Calcium: 7.9 mg/dL — ABNORMAL LOW (ref 8.9–10.3)
Chloride: 98 mmol/L (ref 98–111)
Creatinine, Ser: 0.99 mg/dL (ref 0.61–1.24)
GFR calc Af Amer: >60 mL/min (ref >60)
GFR calc non Af Amer: >60 mL/min (ref >60)
Glucose, Bld: 86 mg/dL (ref 70–99)
Potassium: 2.8 mmol/L — ABNORMAL LOW (ref 3.5–5.1)
Sodium: 138 mmol/L (ref 135–145)

## 2019-05-09 LAB — MRSA PCR SCREENING: MRSA by PCR: NEGATIVE

## 2019-05-09 LAB — MAGNESIUM: Magnesium: 1.8 mg/dL (ref 1.7–2.4)

## 2019-05-09 MED ORDER — SODIUM CHLORIDE 0.9 % IV SOLN
3.0000 g | Freq: Four times a day (QID) | INTRAVENOUS | Status: DC
  Filled 2019-05-09 (×3): qty 8

## 2019-05-09 MED ORDER — POTASSIUM CHLORIDE CRYS ER 20 MEQ PO TBCR
40.0000 meq | EXTENDED_RELEASE_TABLET | ORAL | Status: AC
  Administered 2019-05-09 (×2): 40 meq via ORAL
  Filled 2019-05-09 (×2): qty 2

## 2019-05-09 MED ORDER — AMOXICILLIN-POT CLAVULANATE 875-125 MG PO TABS
1.0000 | ORAL_TABLET | Freq: Two times a day (BID) | ORAL | Status: AC
  Administered 2019-05-09 – 2019-05-12 (×6): 1 via ORAL
  Filled 2019-05-09 (×6): qty 1

## 2019-05-09 NOTE — Progress Notes (Signed)
Physical Therapy Treatment Patient Details Name: Marc Smith MRN: [id number] DOB: 05-Nov-1945 Today's Date: 05/09/2019    History of Present Illness per MD: Marc Smith is a 74 y.o. male with medical history significant of HTN, gout, HLD presentine with RLE pain.    PT Comments    Potassium noted to be 2.8 this am.  Has taken potassium but no further labs drawn at this time.  Proceeded with supine ex as pt asymptomatic but limited OOB out of precaution.  Participated in exercises as described below.  Pt c/o pain RLE and needed increased assist to complete ex's but overall tolerated well.  SNF remains appropriate.   Follow Up Recommendations  SNF     Equipment Recommendations  Rolling walker with 5" wheels    Recommendations for Other Services       Precautions / Restrictions Precautions Precautions: Fall    Mobility  Bed Mobility                  Transfers                    Ambulation/Gait             General Gait Details: deferred due to low potassium   Stairs             Wheelchair Mobility    Modified Rankin (Stroke Patients Only)       Balance                                            Cognition Arousal/Alertness: Awake/alert Behavior During Therapy: WFL for tasks assessed/performed Overall Cognitive Status: Within Functional Limits for tasks assessed                                        Exercises Other Exercises Other Exercises: BLE 2 x 10 - ankle pumps, Heel slides, ab/add and SLR.  Inc assist needed RLE.    General Comments        Pertinent Vitals/Pain Pain Assessment: Faces Faces Pain Scale: Hurts little more Pain Location: RLE with ex.  non-descript pain. Pain Descriptors / Indicators: Other (Comment) Pain Intervention(s): Limited activity within patient's tolerance;Monitored during session    Home Living                      Prior Function             PT Goals (current goals can now be found in the care plan section)      Frequency    Min 2X/week      PT Plan Current plan remains appropriate    Co-evaluation              AM-PAC PT "6 Clicks" Mobility   Outcome Measure  Help needed turning from your back to your side while in a flat bed without using bedrails?: A Little Help needed moving from lying on your back to sitting on the side of a flat bed without using bedrails?: A Little Help needed moving to and from a bed to a chair (including a wheelchair)?: A Lot Help needed standing up from a chair using your arms (e.g., wheelchair or bedside chair)?: A Lot Help needed to walk in hospital room?:  Total Help needed climbing 3-5 steps with a railing? : Total 6 Click Score: 12    End of Session   Activity Tolerance: Patient tolerated treatment well;Treatment limited secondary to medical complications (Comment) Patient left: in bed;with call bell/phone within reach;with bed alarm set Nurse Communication: Mobility status       Time:  -     Charges:                       Chesley Noon, PTA 05/09/19, 2:03 PM

## 2019-05-09 NOTE — NC FL2 (Addendum)
[place] LEVEL OF CARE SCREENING TOOL     IDENTIFICATION  Patient Name: Marc Smith Birthdate: 1945/04/24 Sex: male Admission Date (Current Location): 05/07/2019  [place] and [place] Number:  Engineering geologist and Address:  [facility], [address] [place], [place] [address]      Provider Number: [id number]  Attending Physician Name and Address:  Enzo Bi, MD  Relative Name and Phone Number:       Current Level of Care: Hospital Recommended Level of Care: [facility] Prior Approval Number:    Date Approved/Denied:   PASRR Number: [id number]  Discharge Plan: SNF    Current Diagnoses: Patient Active Problem List   Diagnosis Date Noted  . Acute pain of right lower extremity 05/08/2019  . Tachycardia 05/07/2019  . Aspiration pneumonia ([place])   . Elevated troponin I level   . Thrombocytosis ([place])   . Leukocytosis   . Community acquired pneumonia   . UTI (urinary tract infection) 11/20/2016  . Acute lower UTI 11/20/2016  . Gout 11/20/2016  . Essential hypertension 11/20/2016    Orientation RESPIRATION BLADDER Height & Weight     Self, Place, Situation  Normal External catheter, Incontinent(placed 3/17) Weight: 183 lb (83 kg) Height:  6' (182.9 cm)  BEHAVIORAL SYMPTOMS/MOOD NEUROLOGICAL BOWEL NUTRITION STATUS      Continent Dys. 3(mech soft) w/ thin liquids; general aspiration    AMBULATORY STATUS COMMUNICATION OF NEEDS Skin   Limited Assist Verbally Normal                       Personal Care Assistance Level of Assistance  Bathing, Feeding, Dressing Bathing Assistance: Limited assistance Feeding assistance: Independent Dressing Assistance: Limited assistance     Functional Limitations Info  Sight, Hearing, Speech Sight Info: Adequate Hearing Info: Adequate Speech Info: Adequate    SPECIAL CARE FACTORS FREQUENCY  PT (By licensed PT), OT (By licensed OT)     PT Frequency: 5x OT  Frequency: 5x            Contractures Contractures Info: Not present    Additional Factors Info  Code Status, Allergies Code Status Info: Full Code Allergies Info: NO known allergies           Current Medications (05/09/2019):  This is the current hospital active medication list Current Facility-Administered Medications  Medication Dose Route Frequency Provider Last Rate Last Admin  . acetaminophen (TYLENOL) tablet 650 mg  650 mg Oral Q6H PRN Benita Gutter, RPH   650 mg at 05/08/19 2100   Or  . acetaminophen (TYLENOL) suppository 650 mg  650 mg Rectal Q6H PRN Benita Gutter, RPH      . allopurinol (ZYLOPRIM) tablet 100 mg  100 mg Oral Daily Elodia Florence., MD   100 mg at 05/09/19 0801  . amLODipine (NORVASC) tablet 10 mg  10 mg Oral Daily Elodia Florence., MD   10 mg at 05/09/19 0802  . amoxicillin-clavulanate (AUGMENTIN) 875-125 MG per tablet 1 tablet  1 tablet Oral Q12H Enzo Bi, MD   1 tablet at 05/09/19 1231  . aspirin chewable tablet 81 mg  81 mg Oral Daily Elodia Florence., MD   81 mg at 05/09/19 0801  . atorvastatin (LIPITOR) tablet 40 mg  40 mg Oral Daily Elodia Florence., MD   40 mg at 05/09/19 0801  . carvedilol (COREG) tablet 25 mg  25 mg Oral BID WC  Elodia Florence., MD   25 mg at 05/09/19 0802  . colchicine tablet 0.6 mg  0.6 mg Oral Daily Elodia Florence., MD   0.6 mg at 05/09/19 0802  . enoxaparin (LOVENOX) injection 40 mg  40 mg Subcutaneous Q24H Enzo Bi, MD   40 mg at 05/08/19 2101  . lactated ringers infusion   Intravenous Continuous Elodia Florence., MD 75 mL/hr at 05/09/19 0200 New Bag at 05/09/19 0200     Discharge Medications: Please see discharge summary for a list of discharge medications.  Relevant Imaging Results:  Relevant Lab Results:   Additional Information [id number]   NEED LONG TERM BED  Gerrianne Scale Zoella Roberti, LCSW

## 2019-05-09 NOTE — Progress Notes (Signed)
PROGRESS NOTE    Marc Smith  [id number] DOB: February 15, 1946 DOA: 05/07/2019 PCP: Marc Guise, MD    Assessment & Plan:   Active Problems:   Tachycardia   Elevated troponin I level   Acute pain of right lower extremity    Marc Smith is a 74 y.o. AA male with medical history significant of HTN, gout, HLD who presented with RLE pain.   Aspiration Pneumonia CT notable for debris in R mainstem, however, procal not elevated, and pt not febrile or hypoxic.  But pt did have significant leukocytosis which improved after starting abx. PLAN: --SLP today, noted no overt clinical s/s of aspiration, upgraded to Dysphagia level 3, Thin liquids VIA CUP.  --resume IV Unasyn to treat presumed aspiration PNA  Elevated Troponin:  suspect this is demand.  Trop peaked at 63. --cardiology consulted, recommended d/c heparin and no further ischemic workup.  Sinus tachycardia --continue coreg 25 mg BID   Right Ankle Pain  Right Lower Extremity Swelling, resolved pt suspected this may be gout,  -- Ankle plain film wnl. --LE [place] (negative for DVT) --Continue allopurinol and colchicine  Colonic distention, resolved KUB showed mod stool in the rectum and colonic distention.  Pt noted to be incontinent of liquid stool on 3/17. --repeat KUB on 3/17 showed resolution of colonic distention.   Thrombocytosis --consulted heme on admission, suspect reactive   Hypertension:  continue amlodipine, coreg.   Hold lisinopril for now.  HLD: continue atorvastatin  Stool incontinence   Not taking zoloft per nephew   DVT prophylaxis: Lovenox SQ Code Status: Full code  Family Communication:  Disposition Plan: Medically ready for discharge, to SNF when bed available.   Subjective and Interval History:  Pt reported today that he had "no pain at all."  However, found again sitting in his stool, this time, formed brown stool.  Pt was not aware of having passed stools nor smelling it.  No  noted fever, N/V.     Objective: Vitals:   05/09/19 0401 05/09/19 0822 05/09/19 1227 05/09/19 1552  BP: (!) 151/80 134/76 134/69 (!) 142/72  Pulse: 69 64 63 64  Resp: 20 19 19 19   Temp: 98.4 F (36.9 C) 98.4 F (36.9 C) 98.2 F (36.8 C) 98.3 F (36.8 C)  TempSrc: Oral Oral Oral   SpO2: 97% 98% 95%   Weight: 83 kg     Height:        Intake/Output Summary (Last 24 hours) at 05/09/2019 1859 Last data filed at 05/09/2019 1815 Gross per 24 hour  Intake 480 ml  Output 500 ml  Net -20 ml   Filed Weights   05/08/19 0053 05/08/19 0517 05/09/19 0401  Weight: 82.7 kg 83.3 kg 83 kg    Examination:   Constitutional: NAD, alert, oriented to self, however, seemed very confused at times and unaware  HEENT: conjunctivae and lids normal, EOMI CV: RRR no M,R,G. Distal pulses +2.  No cyanosis.   RESP: rhonchi, normal respiratory effort  GI: +BS, NTND, sitting in his own stool again, this time formed stool. Extremities: No effusions, edema, or tenderness in BLE SKIN: warm, dry and intact Neuro: II - XII grossly intact.     Data Reviewed: I have personally reviewed following labs and imaging studies  CBC: Recent Labs  Lab 05/07/19 1123 05/07/19 1537 05/08/19 0302 05/09/19 0607  WBC 15.0* 15.4* 13.8* 8.4  NEUTROABS 12.1* 12.0*  --   --   HGB 14.0 10.9* 10.7* 9.3*  HCT  42.5 32.9* 32.4* 28.5*  MCV 84.8 85.0 85.0 85.8  PLT 966* 797* 747* 123456*   Basic Metabolic Panel: Recent Labs  Lab 05/07/19 1123 05/09/19 0607  NA 139 138  K 3.4* 2.8*  CL 95* 98  CO2 29 28  GLUCOSE 118* 86  BUN 16 16  CREATININE 1.09 0.99  CALCIUM 9.3 7.9*  MG  --  1.8   GFR: Estimated Creatinine Clearance: 72.9 mL/min (by C-G formula based on SCr of 0.99 mg/dL). Liver Function Tests: Recent Labs  Lab 05/07/19 1123  AST 22  ALT 12  ALKPHOS 68  BILITOT 1.6*  PROT 9.0*  ALBUMIN 3.6   No results for input(s): LIPASE, AMYLASE in the last 168 hours. No results for input(s): AMMONIA in the last  168 hours. Coagulation Profile: Recent Labs  Lab 05/07/19 1123  INR 1.1   Cardiac Enzymes: No results for input(s): CKTOTAL, CKMB, CKMBINDEX, TROPONINI in the last 168 hours. BNP (last 3 results) No results for input(s): PROBNP in the last 8760 hours. HbA1C: Recent Labs    05/08/19 0302  HGBA1C 5.5   CBG: No results for input(s): GLUCAP in the last 168 hours. Lipid Profile: Recent Labs    05/08/19 0302  CHOL 124  HDL 33*  LDLCALC 81  TRIG 52  CHOLHDL 3.8   Thyroid Function Tests: No results for input(s): TSH, T4TOTAL, FREET4, T3FREE, THYROIDAB in the last 72 hours. Anemia Panel: Recent Labs    05/08/19 0302  FERRITIN 246  TIBC 146*  IRON 31*  RETICCTPCT 1.2   Sepsis Labs: Recent Labs  Lab 05/07/19 1123 05/07/19 1341 05/08/19 0827  PROCALCITON  --   --  0.12  LATICACIDVEN 2.2* 2.3* 1.1    Recent Results (from the past 240 hour(s))  Culture, blood (Routine x 2)     Status: None (Preliminary result)   Collection Time: 05/07/19 11:23 AM   Specimen: BLOOD  Result Value Ref Range Status   Specimen Description BLOOD RIGHT ANTECUBITAL  Final   Special Requests   Final    BOTTLES DRAWN AEROBIC AND ANAEROBIC Blood Culture adequate volume   Culture   Final    NO GROWTH 2 DAYS Performed at [facility], [address], [place], [place] [address]    Report Status PENDING  Incomplete  Culture, blood (Routine x 2)     Status: None (Preliminary result)   Collection Time: 05/07/19 11:23 AM   Specimen: BLOOD  Result Value Ref Range Status   Specimen Description BLOOD BLOOD RIGHT HAND  Final   Special Requests   Final    BOTTLES DRAWN AEROBIC AND ANAEROBIC Blood Culture adequate volume   Culture   Final    NO GROWTH 2 DAYS Performed at [facility], [address], [place], [place] [address]    Report Status PENDING  Incomplete  Respiratory Panel by RT PCR (Flu A&B, Covid) - Nasopharyngeal Swab     Status: None   Collection Time: 05/07/19   1:52 PM   Specimen: Nasopharyngeal Swab  Result Value Ref Range Status   SARS Coronavirus 2 by RT PCR NEGATIVE NEGATIVE Final    Comment: (NOTE) SARS-CoV-2 target nucleic acids are NOT DETECTED. The SARS-CoV-2 RNA is generally detectable in upper respiratoy specimens during the acute phase of infection. The lowest concentration of SARS-CoV-2 viral copies this assay can detect is 131 copies/mL. A negative result does not preclude SARS-Cov-2 infection and should not be used as the sole basis for treatment or other  patient management decisions. A negative result may occur with  improper specimen collection/handling, submission of specimen other than nasopharyngeal swab, presence of viral mutation(s) within the areas targeted by this assay, and inadequate number of viral copies (<131 copies/mL). A negative result must be combined with clinical observations, patient history, and epidemiological information. The expected result is Negative. Fact Sheet for Patients:  PinkCheek.be Fact Sheet for Healthcare Providers:  GravelBags.it This test is not yet ap proved or cleared by the [place] FDA and  has been authorized for detection and/or diagnosis of SARS-CoV-2 by FDA under an Emergency Use Authorization (EUA). This EUA will remain  in effect (meaning this test can be used) for the duration of the COVID-19 declaration under Section 564(b)(1) of the Act, 21 U.S.C. section 360bbb-3(b)(1), unless the authorization is terminated or revoked sooner.    Influenza A by PCR NEGATIVE NEGATIVE Final   Influenza B by PCR NEGATIVE NEGATIVE Final    Comment: (NOTE) The Xpert Xpress SARS-CoV-2/FLU/RSV assay is intended as an aid in  the diagnosis of influenza from Nasopharyngeal swab specimens and  should not be used as a sole basis for treatment. Nasal washings and  aspirates are unacceptable for Xpert Xpress SARS-CoV-2/FLU/RSV   testing. Fact Sheet for Patients: PinkCheek.be Fact Sheet for Healthcare Providers: GravelBags.it This test is not yet approved or cleared by the [place] FDA and  has been authorized for detection and/or diagnosis of SARS-CoV-2 by  FDA under an Emergency Use Authorization (EUA). This EUA will remain  in effect (meaning this test can be used) for the duration of the  Covid-19 declaration under Section 564(b)(1) of the Act, 21  U.S.C. section 360bbb-3(b)(1), unless the authorization is  terminated or revoked. Performed at [facility], [address] [place], [place] [address]   MRSA PCR Screening     Status: None   Collection Time: 05/09/19 12:45 AM   Specimen: Nasopharyngeal  Result Value Ref Range Status   MRSA by PCR NEGATIVE NEGATIVE Final    Comment:        The GeneXpert MRSA Assay (FDA approved for NASAL specimens only), is one component of a comprehensive MRSA colonization surveillance program. It is not intended to diagnose MRSA infection nor to guide or monitor treatment for MRSA infections. Performed at [facility], [address] [place], [place] [address]       Radiology Studies: DG Abd 1 View  Result Date: 05/08/2019 CLINICAL DATA:  Impacted stool EXAM: ABDOMEN - 1 VIEW COMPARISON:  05/07/2019 FINDINGS: Mild diffuse increased bowel gas throughout. No definitive obstructive pattern. Mild stool at the rectum. Vascular calcifications IMPRESSION: Overall nonobstructed gas pattern. Small amount of retained stool at the rectum Electronically Signed   By: Donavan Foil M.D.   On: 05/08/2019 19:55   DG Abd 1 View  Result Date: 05/07/2019 CLINICAL DATA:  Bowel obstruction. EXAM: ABDOMEN - 1 VIEW COMPARISON:  None. FINDINGS: There are multiple old healed right-sided rib fractures. There is gaseous distention of loops of colon scattered throughout the abdomen. There is a moderate amount  of stool at the level of the rectum. There is no pneumatosis. No definite free air. No evidence for a small bowel obstruction. There are degenerative changes throughout the visualized lumbar spine. 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 IMPRESSION: 1. Gaseous distention of the colon which may represent a colonic ileus. 2. No evidence for a small bowel obstruction. 3. Moderate amount of stool at the level of the rectum. Electronically Signed  By: Constance Holster M.D.   On: 05/07/2019 21:52   ECHOCARDIOGRAM COMPLETE  Result Date: 05/08/2019    ECHOCARDIOGRAM REPORT   Patient Name:   JARQUEZ MCKNIGHT Date of Exam: 05/08/2019 Medical Rec #:  [id number]       Height:       72.0 in Accession #:    [id number]      Weight:       183.6 lb Date of Birth:  10-18-1945        BSA:          2.054 m Patient Age:    74 years        BP:           178/85 mmHg Patient Gender: M               HR:           101 bpm. Exam Location:  [facility] Procedure: 2D Echo, Cardiac Doppler and Color Doppler Indications:     Elevated troponin  History:         Patient has no prior history of Echocardiogram examinations.                  Risk Factors:Hypertension.  Sonographer:     Sherrie Sport RDCS (AE) Referring Phys:  [id number] Alben Deeds JR Diagnosing Phys: Bartholome Bill MD  Sonographer Comments: Technically difficult study due to poor echo windows. IMPRESSIONS  1. Dextrocardia. Left ventricular ejection fraction, by estimation, is 50 to 55%. The left ventricle has low normal function. The left ventricle has no regional wall motion abnormalities. Left ventricular diastolic parameters were normal.  2. Right ventricular systolic function is normal. The right ventricular size is normal.  3. The mitral valve was not well visualized. Trivial mitral valve regurgitation.  4. The aortic valve was not well visualized. Aortic valve regurgitation is not visualized. FINDINGS  Left Ventricle: Dextrocardia. Left ventricular ejection fraction,  by estimation, is 50 to 55%. The left ventricle has low normal function. The left ventricle has no regional wall motion abnormalities. The left ventricular internal cavity size was normal  in size. There is borderline left ventricular hypertrophy. Left ventricular diastolic parameters were normal. Right Ventricle: The right ventricular size is normal. No increase in right ventricular wall thickness. Right ventricular systolic function is normal. Left Atrium: Left atrial size was normal in size. Right Atrium: Right atrial size was normal in size. Pericardium: There is no evidence of pericardial effusion. Mitral Valve: The mitral valve was not well visualized. Trivial mitral valve regurgitation. Tricuspid Valve: The tricuspid valve is not well visualized. Tricuspid valve regurgitation is mild. Aortic Valve: The aortic valve was not well visualized. Aortic valve regurgitation is not visualized. Pulmonic Valve: The pulmonic valve was not well visualized. Pulmonic valve regurgitation is not visualized. Aorta: The aortic root was not well visualized. IAS/Shunts: The interatrial septum was not assessed.  LEFT VENTRICLE PLAX 2D LVIDd:         4.21 cm LVIDs:         3.29 cm LV PW:         1.34 cm LV IVS:        1.39 cm LVOT diam:     2.20 cm LVOT Area:     3.80 cm  LEFT ATRIUM         Index LA diam:    2.90 cm 1.41 cm/m  PULMONIC VALVE AORTA                 PV Vmax:        0.90 m/s Ao Root diam: 3.00 cm PV Peak grad:   3.2 mmHg                       RVOT Peak grad: 4 mmHg   SHUNTS Systemic Diam: 2.20 cm Bartholome Bill MD Electronically signed by Bartholome Bill MD Signature Date/Time: 05/08/2019/12:23:08 PM    Final      Scheduled Meds: . allopurinol  100 mg Oral Daily  . amLODipine  10 mg Oral Daily  . amoxicillin-clavulanate  1 tablet Oral Q12H  . aspirin  81 mg Oral Daily  . atorvastatin  40 mg Oral Daily  . carvedilol  25 mg Oral BID WC  . colchicine  0.6 mg Oral Daily  . enoxaparin  (LOVENOX) injection  40 mg Subcutaneous Q24H   Continuous Infusions: . lactated ringers 75 mL/hr at 05/09/19 1642     LOS: 1 day     Enzo Bi, MD [facility] If 7PM-7AM, please contact night-coverage 05/09/2019, 6:59 PM

## 2019-05-09 NOTE — Progress Notes (Addendum)
  Speech Language Pathology Treatment: Dysphagia  Patient Details Name: Marc Smith MRN: [id number] DOB: 1945/11/15 Today's Date: 05/09/2019 Time: 1355-1430 SLP Time Calculation (min) (ACUTE ONLY): 35 min  Assessment / Plan / Recommendation Clinical Impression  Pt seen today for ongoing assessment of swallowing and toleration of diet. The diet recommended by this SLP was a Minced diet in order that foods be well-broken down for pt to more easily masticate d/t few missing Dentition AND decreased attention during tasks yesterday(more munching mastication).   Pt assisted in sitting upright in bed for safe oral intake. He consumed trials of thin liquids via Cup/Straw and Minced, broken down/moist foods w/ No significant oral phase deficits for mashing/gumming/chewing the Minced foods. Oral phase was grossly [facility] for the time needed for bolus management and breaking down the Minced solids for swallowing in light of few missing Dentition. He also endorsed ease of it being swallowed and denied any coughing or choking feelings. Pt demonstrated no overt s/s of aspiration w/ the sips of thin liquids, minced solids -- clear vocal quality b/t trials. Pt fed self w/ setup support. Recommend upgrade of diet to soft, Minced foods diet (for ease of mastication) w/ added purees in diet; thin liquids. Recommend general aspiration precautions, recommend Pills given in Puree whole vs crushed for ease of swallowing/clearing. NSG to assist pt at meals for tray setup and support w/ feeding as needed. Pt appears potientially at his baseline re: swallowing from an oropharyngeal phase standpoint in light of few missing Dentition and Cognitive status. ST services can be available for further assessment/needs while admitted per NSG reconsult. Handouts left in room re: general aspiration precautions and diet consistency. NSG updated.      HPI HPI: Pt  is a 74 y.o. male with medical history significant of HTN, gout, HLD  presenting with RLE pain. History is obtained with assistance from nephew and chart.  Patient notes he's had R ankle pain for about a week.  Suspects it's due to gout.  Describes just as pain.  No other complaints.  No fevers, chest pain, SOB.  Nephew notes he's been sitting in chair for a few days, but couldn't get up due to pain so they brought him to hospital. CXR: "Ill-defined opacity in each lung base, concerning for atypical organism pneumonia. No consolidation. Lungs elsewhere clear".       SLP Plan  All goals met       Recommendations  Diet recommendations: Dysphagia 3 (mechanical soft);Thin liquid Liquids provided via: Cup;Straw(monitor) Medication Administration: Whole meds with puree(for safer swallowing) Supervision: Patient able to self feed;Intermittent supervision to cue for compensatory strategies Compensations: Minimize environmental distractions;Slow rate;Small sips/bites;Lingual sweep for clearance of pocketing;Multiple dry swallows after each bite/sip;Follow solids with liquid Postural Changes and/or Swallow Maneuvers: Seated upright 90 degrees;Upright 30-60 min after meal                General recommendations: (Dietician f/u for support) Oral Care Recommendations: Oral care BID;Oral care before and after PO;Staff/trained caregiver to provide oral care Follow up Recommendations: None SLP Visit Diagnosis: Dysphagia, oral phase (R13.11)(Cognitive decline) Plan: All goals met       GO                 Marc Kenner, MS, CCC-SLP Marc Smith 05/09/2019, 2:42 PM

## 2019-05-09 NOTE — Progress Notes (Signed)
Patient Name: Marc Smith Date of Encounter: 05/09/2019  Hospital Problem List     Active Problems:   Tachycardia   Elevated troponin I level   Acute pain of right lower extremity    Patient Profile     74 year old male with a past medical history significant for hypertension, hyperlipidemia, and gout who presented to the ED on 05/07/19 for right foot pain. He was noted to be tachycardic on arrival with ECG revealing sinus tachycardia at a ventricular rate of 147bpm. Repeat ECG revealing no significant interval change. High sensitivity troponin was elevated x 3 at 108, 340, 769 respectively. CBC revealed a WBC of 15 and lactic acid was 2.2. Chest xray revealed an ill defined opacity in bilateral lung bases, concerning for atypical pneumonia; chest CT negative for PE.   Today, Marc Smith denies chest pain or chest pressure.  He denies palpitations or heart racing sensation.  He denies shortness of breath, lower extremity swelling, orthopnea, or PND.  He denies syncopal/presyncopal episodes.   Subjective   No complaints  Inpatient Medications    . allopurinol  100 mg Oral Daily  . amLODipine  10 mg Oral Daily  . aspirin  81 mg Oral Daily  . atorvastatin  40 mg Oral Daily  . carvedilol  25 mg Oral BID WC  . colchicine  0.6 mg Oral Daily  . enoxaparin (LOVENOX) injection  40 mg Subcutaneous Q24H  . potassium chloride  40 mEq Oral Q4H    Vital Signs    Vitals:   05/08/19 1524 05/08/19 2006 05/09/19 0401 05/09/19 0822  BP: 130/74 124/81 (!) 151/80 134/76  Pulse: 78 78 69 64  Resp: 18 20 20 19   Temp: 98.2 F (36.8 C) 97.9 F (36.6 C) 98.4 F (36.9 C) 98.4 F (36.9 C)  TempSrc: Oral Oral Oral Oral  SpO2: 95% 96% 97% 98%  Weight:   83 kg   Height:        Intake/Output Summary (Last 24 hours) at 05/09/2019 1122 Last data filed at 05/09/2019 0407 Gross per 24 hour  Intake 850.15 ml  Output 800 ml  Net 50.15 ml   Filed Weights   05/08/19 0053 05/08/19 0517  05/09/19 0401  Weight: 82.7 kg 83.3 kg 83 kg    Physical Exam    GEN: Well nourished, well developed, in no acute distress.  HEENT: normal.  Neck: Supple, no JVD, carotid bruits, or masses. Cardiac: regularly irregular. 2 Component rub Respiratory:  Respirations regular and unlabored, clear to auscultation bilaterally. GI: Soft, nontender, nondistended, BS + x 4. MS: no deformity or atrophy. Skin: warm and dry, no rash. Neuro:  Strength and sensation are intact. Psych: Normal affect.  Labs    CBC Recent Labs    05/07/19 1123 05/07/19 1123 05/07/19 1537 05/07/19 1537 05/08/19 0302 05/09/19 0607  WBC 15.0*   < > 15.4*   < > 13.8* 8.4  NEUTROABS 12.1*  --  12.0*  --   --   --   HGB 14.0   < > 10.9*   < > 10.7* 9.3*  HCT 42.5   < > 32.9*   < > 32.4* 28.5*  MCV 84.8   < > 85.0   < > 85.0 85.8  PLT 966*   < > 797*   < > 747* 681*   < > = values in this interval not displayed.   Basic Metabolic Panel Recent Labs    05/07/19 1123 05/09/19 [id number]  NA 139 138  K 3.4* 2.8*  CL 95* 98  CO2 29 28  GLUCOSE 118* 86  BUN 16 16  CREATININE 1.09 0.99  CALCIUM 9.3 7.9*  MG  --  1.8   Liver Function Tests Recent Labs    05/07/19 1123  AST 22  ALT 12  ALKPHOS 68  BILITOT 1.6*  PROT 9.0*  ALBUMIN 3.6   No results for input(s): LIPASE, AMYLASE in the last 72 hours. Cardiac Enzymes No results for input(s): CKTOTAL, CKMB, CKMBINDEX, TROPONINI in the last 72 hours. BNP No results for input(s): BNP in the last 72 hours. D-Dimer No results for input(s): DDIMER in the last 72 hours. Hemoglobin A1C Recent Labs    05/08/19 0302  HGBA1C 5.5   Fasting Lipid Panel Recent Labs    05/08/19 0302  CHOL 124  HDL 33*  LDLCALC 81  TRIG 52  CHOLHDL 3.8   Thyroid Function Tests No results for input(s): TSH, T4TOTAL, T3FREE, THYROIDAB in the last 72 hours.  Invalid input(s): FREET3  Telemetry    Sinus rhythm with pacs  ECG      Radiology    DG Chest 2  View  Result Date: 05/07/2019 CLINICAL DATA:  Tachycardia EXAM: CHEST - 2 VIEW COMPARISON:  November 20, 2016. FINDINGS: There is ill-defined opacity in the lung bases without consolidation. Heart size and pulmonary vascularity are normal. No adenopathy. There is elevation of the left hemidiaphragm. Visualized bowel appears mildly dilated without appreciable air-fluid levels. No free air. There are old healed rib fractures on the right. IMPRESSION: 1. Ill-defined opacity in each lung base, concerning for atypical organism pneumonia. No consolidation. Lungs elsewhere clear. 2. Elevation of left hemidiaphragm is stable. Visualized bowel appear somewhat dilated. Question a degree of underlying ileus. 3.  Heart size normal. 4.  No evident adenopathy. Electronically Signed   By: Lowella Grip III M.D.   On: 05/07/2019 12:06   DG Ankle Complete Right  Result Date: 05/07/2019 CLINICAL DATA:  Foot pain EXAM: RIGHT ANKLE - COMPLETE 3+ VIEW COMPARISON:  None. FINDINGS: Alignment is anatomic. No acute fracture. Joint spaces are preserved. Vascular calcification is noted. Metallic fragments overlie the distal fibula. IMPRESSION: No acute osseous abnormality. Electronically Signed   By: Macy Mis M.D.   On: 05/07/2019 13:27   DG Abd 1 View  Result Date: 05/08/2019 CLINICAL DATA:  Impacted stool EXAM: ABDOMEN - 1 VIEW COMPARISON:  05/07/2019 FINDINGS: Mild diffuse increased bowel gas throughout. No definitive obstructive pattern. Mild stool at the rectum. Vascular calcifications IMPRESSION: Overall nonobstructed gas pattern. Small amount of retained stool at the rectum Electronically Signed   By: Donavan Foil M.D.   On: 05/08/2019 19:55   DG Abd 1 View  Result Date: 05/07/2019 CLINICAL DATA:  Bowel obstruction. EXAM: ABDOMEN - 1 VIEW COMPARISON:  None. FINDINGS: There are multiple old healed right-sided rib fractures. There is gaseous distention of loops of colon scattered throughout the abdomen. There is  a moderate amount of stool at the level of the rectum. There is no pneumatosis. No definite free air. No evidence for a small bowel obstruction. There are degenerative changes throughout the visualized lumbar spine. 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 IMPRESSION: 1. Gaseous distention of the colon which may represent a colonic ileus. 2. No evidence for a small bowel obstruction. 3. Moderate amount of stool at the level of the rectum. Electronically Signed   By: Constance Holster M.D.   On: 05/07/2019 21:52   CT  Angio Chest PE W and/or Wo Contrast  Result Date: 05/07/2019 CLINICAL DATA:  Pneumonia. EXAM: CT ANGIOGRAPHY CHEST WITH CONTRAST TECHNIQUE: Multidetector CT imaging of the chest was performed using the standard protocol during bolus administration of intravenous contrast. Multiplanar CT image reconstructions and MIPs were obtained to evaluate the vascular anatomy. CONTRAST:  69mL OMNIPAQUE IOHEXOL 350 MG/ML SOLN COMPARISON:  None. FINDINGS: Cardiovascular: Contrast injection is sufficient to demonstrate satisfactory opacification of the pulmonary arteries to the segmental level. There is no pulmonary embolus. The main pulmonary artery is within normal limits for size. There is no CT evidence of acute right heart strain. There are atherosclerotic changes of the visualized aorta without evidence for an aneurysm or dissection. Heart size is borderline enlarged. Coronary artery calcifications are noted. Mediastinum/Nodes: --No mediastinal or hilar lymphadenopathy. --No axillary lymphadenopathy. --No supraclavicular lymphadenopathy. --there is a small left-sided thyroid nodule, for which no further follow-up is required. --The esophagus is unremarkable Lungs/Pleura: There is a moderate amount of debris within the right mainstem bronchus. There is no pneumothorax. No large pleural effusion. No focal infiltrate. There is elevation of the left hemidiaphragm with atelectasis at the left lung  base. Upper Abdomen: There are probable nonobstructing stones in the upper pole the left kidney. There is a cyst arising from the upper pole of the right kidney. Musculoskeletal: No chest wall abnormality. No acute or significant osseous findings. There are old healed right-sided rib fractures. Review of the MIP images confirms the above findings. IMPRESSION: 1. No CT evidence for acute pulmonary embolus. 2. Moderate amount of debris within the right mainstem bronchus, which may indicate aspiration. 3. Elevation of the left hemidiaphragm with atelectasis at the left lung base. No focal infiltrate. 4. Aortic Atherosclerosis (ICD10-I70.0). Electronically Signed   By: Constance Holster M.D.   On: 05/07/2019 16:10   US Venous Img Lower Bilateral (DVT)  Result Date: 05/07/2019 CLINICAL DATA:  Edema EXAM: BILATERAL LOWER EXTREMITY VENOUS DOPPLER ULTRASOUND TECHNIQUE: Gray-scale sonography with compression, as well as color and duplex ultrasound, were performed to evaluate the deep venous system(s) from the level of the common femoral vein through the popliteal and proximal calf veins. COMPARISON:  02/08/2012 FINDINGS: VENOUS Normal compressibility of the common femoral, superficial femoral, and popliteal veins, as well as the visualized calf veins. Visualized portions of profunda femoral vein and great saphenous vein unremarkable. No filling defects to suggest DVT on grayscale or color Doppler imaging. Doppler waveforms show normal direction of venous flow, normal respiratory phasicity and response to augmentation. Calf veins are poorly visualized bilaterally. OTHER None. Limitations: none IMPRESSION: No femoropopliteal DVT nor evidence of DVT within the visualized calf veins. If clinical symptoms are inconsistent or if there are persistent or worsening symptoms, further imaging (possibly involving the iliac veins) may be warranted. Electronically Signed   By: Lavonia Dana M.D.   On: 05/07/2019 18:01    ECHOCARDIOGRAM COMPLETE  Result Date: 05/08/2019    ECHOCARDIOGRAM REPORT   Patient Name:   WINSTON REISCHL Date of Exam: 05/08/2019 Medical Rec #:  [id number]       Height:       72.0 in Accession #:    [id number]      Weight:       183.6 lb Date of Birth:  1945/07/16        BSA:          2.054 m Patient Age:    66 years        BP:  178/85 mmHg Patient Gender: M               HR:           101 bpm. Exam Location:  [facility] Procedure: 2D Echo, Cardiac Doppler and Color Doppler Indications:     Elevated troponin  History:         Patient has no prior history of Echocardiogram examinations.                  Risk Factors:Hypertension.  Sonographer:     Sherrie Sport RDCS (AE) Referring Phys:  [id number] Alben Deeds JR Diagnosing Phys: Bartholome Bill MD  Sonographer Comments: Technically difficult study due to poor echo windows. IMPRESSIONS  1. Dextrocardia. Left ventricular ejection fraction, by estimation, is 50 to 55%. The left ventricle has low normal function. The left ventricle has no regional wall motion abnormalities. Left ventricular diastolic parameters were normal.  2. Right ventricular systolic function is normal. The right ventricular size is normal.  3. The mitral valve was not well visualized. Trivial mitral valve regurgitation.  4. The aortic valve was not well visualized. Aortic valve regurgitation is not visualized. FINDINGS  Left Ventricle: Dextrocardia. Left ventricular ejection fraction, by estimation, is 50 to 55%. The left ventricle has low normal function. The left ventricle has no regional wall motion abnormalities. The left ventricular internal cavity size was normal  in size. There is borderline left ventricular hypertrophy. Left ventricular diastolic parameters were normal. Right Ventricle: The right ventricular size is normal. No increase in right ventricular wall thickness. Right ventricular systolic function is normal. Left Atrium: Left atrial size was normal in size. Right Atrium:  Right atrial size was normal in size. Pericardium: There is no evidence of pericardial effusion. Mitral Valve: The mitral valve was not well visualized. Trivial mitral valve regurgitation. Tricuspid Valve: The tricuspid valve is not well visualized. Tricuspid valve regurgitation is mild. Aortic Valve: The aortic valve was not well visualized. Aortic valve regurgitation is not visualized. Pulmonic Valve: The pulmonic valve was not well visualized. Pulmonic valve regurgitation is not visualized. Aorta: The aortic root was not well visualized. IAS/Shunts: The interatrial septum was not assessed.  LEFT VENTRICLE PLAX 2D LVIDd:         4.21 cm LVIDs:         3.29 cm LV PW:         1.34 cm LV IVS:        1.39 cm LVOT diam:     2.20 cm LVOT Area:     3.80 cm  LEFT ATRIUM         Index LA diam:    2.90 cm 1.41 cm/m                        PULMONIC VALVE AORTA                 PV Vmax:        0.90 m/s Ao Root diam: 3.00 cm PV Peak grad:   3.2 mmHg                       RVOT Peak grad: 4 mmHg   SHUNTS Systemic Diam: 2.20 cm Bartholome Bill MD Electronically signed by Bartholome Bill MD Signature Date/Time: 05/08/2019/12:23:08 PM    Final     Assessment & Plan    Active Problems:   Tachycardia   Elevated troponin I level  1.  Sinus tachycardia              -Improved, rate now in the 70- 90s on carvedilol 25mg  BID   2.  Elevated troponin              -Likely demand ischemia in the setting of tachycardia, pneumonia              -Patient continues to deny chest pain; further ischemic workup not indicated at this time   3.  Hypertension              -Continue carvedilol 25mg  BID and amlodipine 10mg  daily   Signed, Javier Docker. Ronrico Dupin MD 05/09/2019, 11:22 AM  Pager: [phone]

## 2019-05-10 LAB — BASIC METABOLIC PANEL
Anion gap: 10 (ref 5–15)
BUN: 13 mg/dL (ref 8–23)
CO2: 28 mmol/L (ref 22–32)
Calcium: 8.1 mg/dL — ABNORMAL LOW (ref 8.9–10.3)
Chloride: 101 mmol/L (ref 98–111)
Creatinine, Ser: 0.95 mg/dL (ref 0.61–1.24)
GFR calc Af Amer: >60 mL/min (ref >60)
GFR calc non Af Amer: >60 mL/min (ref >60)
Glucose, Bld: 95 mg/dL (ref 70–99)
Potassium: 3.3 mmol/L — ABNORMAL LOW (ref 3.5–5.1)
Sodium: 139 mmol/L (ref 135–145)

## 2019-05-10 LAB — CBC
HCT: 30 % — ABNORMAL LOW (ref 39.0–52.0)
Hemoglobin: 10 g/dL — ABNORMAL LOW (ref 13.0–17.0)
MCH: 28.2 pg (ref 26.0–34.0)
MCHC: 33.3 g/dL (ref 30.0–36.0)
MCV: 84.7 fL (ref 80.0–100.0)
Platelets: 730 10*3/uL — ABNORMAL HIGH (ref 150–400)
RBC: 3.54 MIL/uL — ABNORMAL LOW (ref 4.22–5.81)
RDW: 14.1 % (ref 11.5–15.5)
WBC: 8.2 10*3/uL (ref 4.0–10.5)
nRBC: 0 % (ref 0.0–0.2)

## 2019-05-10 LAB — MAGNESIUM: Magnesium: 2 mg/dL (ref 1.7–2.4)

## 2019-05-10 MED ORDER — POTASSIUM CHLORIDE CRYS ER 20 MEQ PO TBCR
40.0000 meq | EXTENDED_RELEASE_TABLET | Freq: Once | ORAL | Status: AC
  Administered 2019-05-10: 40 meq via ORAL
  Filled 2019-05-10: qty 2

## 2019-05-10 NOTE — Progress Notes (Signed)
Hematology/Oncology Progress Note [facility] [phone] [phone]  Patient Care Team: Lavera Guise, MD as PCP - General (Internal Medicine)   Name of the patient: Marc Smith  [id number]  April 24, 1945  Date of visit: 05/10/19   INTERVAL HISTORY-  Patient was seen at the bedside.  Reports cough, not bringing up any phlegm.  Poor historian.  No other new complaints.   Review of systems- Review of Systems  Constitutional: Negative for fatigue.  Respiratory: Negative for cough and shortness of breath.   Cardiovascular: Negative for chest pain.  Gastrointestinal: Negative for abdominal pain.  Genitourinary: Negative for difficulty urinating.   Neurological: Negative for dizziness.  Hematological: Negative for adenopathy. Does not bruise/bleed easily.    No Known Allergies  Patient Active Problem List   Diagnosis Date Noted  . Acute pain of right lower extremity 05/08/2019  . Tachycardia 05/07/2019  . Aspiration pneumonia ([place])   . Elevated troponin I level   . Thrombocytosis ([place])   . Leukocytosis   . Community acquired pneumonia   . UTI (urinary tract infection) 11/20/2016  . Acute lower UTI 11/20/2016  . Gout 11/20/2016  . Essential hypertension 11/20/2016     Past Medical History:  Diagnosis Date  . Gout    Per Patient's nephew  . Hyperlipidemia    Patient's nephew  . Hypertension      History reviewed. No pertinent surgical history.  Social History   Socioeconomic History  . Marital status: Single    Spouse name: Not on file  . Number of children: Not on file  . Years of education: Not on file  . Highest education level: Not on file  Occupational History  . Not on file  Tobacco Use  . Smoking status: Current Every Day Smoker    Packs/day: 1.00    Types: Cigarettes  . Smokeless tobacco: Current User  Substance and Sexual Activity  . Alcohol use: No  . Drug use: No  . Sexual activity: Not on file   Other Topics Concern  . Not on file  Social History Narrative  . Not on file   Social Determinants of Health   Financial Resource Strain:   . Difficulty of Paying Living Expenses:   Food Insecurity:   . Worried About Charity fundraiser in the Last Year:   . Arboriculturist in the Last Year:   Transportation Needs:   . Film/video editor (Medical):   Marland Kitchen Lack of Transportation (Non-Medical):   Physical Activity:   . Days of Exercise per Week:   . Minutes of Exercise per Session:   Stress:   . Feeling of Stress :   Social Connections:   . Frequency of Communication with Friends and Family:   . Frequency of Social Gatherings with Friends and Family:   . Attends Religious Services:   . Active Member of Clubs or Organizations:   . Attends Archivist Meetings:   Marland Kitchen Marital Status:   Intimate Partner Violence:   . Fear of Current or Ex-Partner:   . Emotionally Abused:   Marland Kitchen Physically Abused:   . Sexually Abused:      No family history on file.   Current Facility-Administered Medications:  .  acetaminophen (TYLENOL) tablet 650 mg, 650 mg, Oral, Q6H PRN, 650 mg at 05/08/19 2100 **OR** acetaminophen (TYLENOL) suppository 650 mg, 650 mg, Rectal, Q6H PRN, Benita Gutter, RPH .  allopurinol (ZYLOPRIM) tablet 100 mg, 100 mg, Oral,  Daily, Elodia Florence., MD, 100 mg at 05/10/19 [id number] .  amLODipine (NORVASC) tablet 10 mg, 10 mg, Oral, Daily, Elodia Florence., MD, 10 mg at 05/10/19 [id number] .  amoxicillin-clavulanate (AUGMENTIN) 875-125 MG per tablet 1 tablet, 1 tablet, Oral, Q12H, Enzo Bi, MD, 1 tablet at 05/10/19 [phone] .  aspirin chewable tablet 81 mg, 81 mg, Oral, Daily, Elodia Florence., MD, 81 mg at 05/10/19 [id number] .  atorvastatin (LIPITOR) tablet 40 mg, 40 mg, Oral, Daily, Elodia Florence., MD, 40 mg at 05/10/19 [id number] .  carvedilol (COREG) tablet 25 mg, 25 mg, Oral, BID WC, Elodia Florence., MD, 25 mg at 05/10/19 [id number] .  colchicine tablet 0.6  mg, 0.6 mg, Oral, Daily, Elodia Florence., MD, 0.6 mg at 05/10/19 [id number] .  enoxaparin (LOVENOX) injection 40 mg, 40 mg, Subcutaneous, Q24H, Enzo Bi, MD, 40 mg at 05/09/19 2137   Physical exam:  Vitals:   05/09/19 2205 05/10/19 0447 05/10/19 0920 05/10/19 1219  BP: 128/85 (!) 156/96 (!) 153/78 127/87  Pulse: 71 72 60 67  Resp: 20 19  16   Temp: 98.2 F (36.8 C) 98.5 F (36.9 C)  97.6 F (36.4 C)  TempSrc: Oral Oral  Oral  SpO2: 96% 99%  100%  Weight:  182 lb 12.8 oz (82.9 kg)    Height:       Physical Exam  Constitutional: No distress.  HENT:  Head: Normocephalic and atraumatic.  Mouth/Throat: No oropharyngeal exudate.  Eyes: Pupils are equal, round, and reactive to light. EOM are normal.  Cardiovascular: Normal rate.  Pulmonary/Chest: Effort normal and breath sounds normal.  Abdominal: Soft.  Musculoskeletal:        General: No edema. Normal range of motion.     Cervical back: Normal range of motion and neck supple.  Neurological: He is alert.  Skin: Skin is warm and dry. No erythema.  Psychiatric:  Flat       CMP Latest Ref Rng & Units 05/10/2019  Glucose 70 - 99 mg/dL 95  BUN 8 - 23 mg/dL 13  Creatinine 0.61 - 1.24 mg/dL 0.95  Sodium 135 - 145 mmol/L 139  Potassium 3.5 - 5.1 mmol/L 3.3(L)  Chloride 98 - 111 mmol/L 101  CO2 22 - 32 mmol/L 28  Calcium 8.9 - 10.3 mg/dL 8.1(L)  Total Protein 6.5 - 8.1 g/dL -  Total Bilirubin 0.3 - 1.2 mg/dL -  Alkaline Phos 38 - 126 U/L -  AST 15 - 41 U/L -  ALT 0 - 44 U/L -   CBC Latest Ref Rng & Units 05/10/2019  WBC 4.0 - 10.5 K/uL 8.2  Hemoglobin 13.0 - 17.0 g/dL 10.0(L)  Hematocrit 39.0 - 52.0 % 30.0(L)  Platelets 150 - 400 K/uL 730(H)   RADIOGRAPHIC STUDIES: I have personally reviewed the radiological images as listed and agreed with the findings in the report.  DG Chest 2 View  Result Date: 05/07/2019 CLINICAL DATA:  Tachycardia EXAM: CHEST - 2 VIEW COMPARISON:  November 20, 2016. FINDINGS: There is  ill-defined opacity in the lung bases without consolidation. Heart size and pulmonary vascularity are normal. No adenopathy. There is elevation of the left hemidiaphragm. Visualized bowel appears mildly dilated without appreciable air-fluid levels. No free air. There are old healed rib fractures on the right. IMPRESSION: 1. Ill-defined opacity in each lung base, concerning for atypical organism pneumonia. No consolidation. Lungs elsewhere clear. 2. Elevation of left hemidiaphragm is stable. Visualized bowel appear somewhat dilated.  Question a degree of underlying ileus. 3.  Heart size normal. 4.  No evident adenopathy. Electronically Signed   By: Lowella Grip III M.D.   On: 05/07/2019 12:06   DG Ankle Complete Right  Result Date: 05/07/2019 CLINICAL DATA:  Foot pain EXAM: RIGHT ANKLE - COMPLETE 3+ VIEW COMPARISON:  None. FINDINGS: Alignment is anatomic. No acute fracture. Joint spaces are preserved. Vascular calcification is noted. Metallic fragments overlie the distal fibula. IMPRESSION: No acute osseous abnormality. Electronically Signed   By: Macy Mis M.D.   On: 05/07/2019 13:27   DG Abd 1 View  Result Date: 05/08/2019 CLINICAL DATA:  Impacted stool EXAM: ABDOMEN - 1 VIEW COMPARISON:  05/07/2019 FINDINGS: Mild diffuse increased bowel gas throughout. No definitive obstructive pattern. Mild stool at the rectum. Vascular calcifications IMPRESSION: Overall nonobstructed gas pattern. Small amount of retained stool at the rectum Electronically Signed   By: Donavan Foil M.D.   On: 05/08/2019 19:55   DG Abd 1 View  Result Date: 05/07/2019 CLINICAL DATA:  Bowel obstruction. EXAM: ABDOMEN - 1 VIEW COMPARISON:  None. FINDINGS: There are multiple old healed right-sided rib fractures. There is gaseous distention of loops of colon scattered throughout the abdomen. There is a moderate amount of stool at the level of the rectum. There is no pneumatosis. No definite free air. No evidence for a small bowel  obstruction. There are degenerative changes throughout the visualized lumbar spine. 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 IMPRESSION: 1. Gaseous distention of the colon which may represent a colonic ileus. 2. No evidence for a small bowel obstruction. 3. Moderate amount of stool at the level of the rectum. Electronically Signed   By: Constance Holster M.D.   On: 05/07/2019 21:52   CT Angio Chest PE W and/or Wo Contrast  Result Date: 05/07/2019 CLINICAL DATA:  Pneumonia. EXAM: CT ANGIOGRAPHY CHEST WITH CONTRAST TECHNIQUE: Multidetector CT imaging of the chest was performed using the standard protocol during bolus administration of intravenous contrast. Multiplanar CT image reconstructions and MIPs were obtained to evaluate the vascular anatomy. CONTRAST:  33mL OMNIPAQUE IOHEXOL 350 MG/ML SOLN COMPARISON:  None. FINDINGS: Cardiovascular: Contrast injection is sufficient to demonstrate satisfactory opacification of the pulmonary arteries to the segmental level. There is no pulmonary embolus. The main pulmonary artery is within normal limits for size. There is no CT evidence of acute right heart strain. There are atherosclerotic changes of the visualized aorta without evidence for an aneurysm or dissection. Heart size is borderline enlarged. Coronary artery calcifications are noted. Mediastinum/Nodes: --No mediastinal or hilar lymphadenopathy. --No axillary lymphadenopathy. --No supraclavicular lymphadenopathy. --there is a small left-sided thyroid nodule, for which no further follow-up is required. --The esophagus is unremarkable Lungs/Pleura: There is a moderate amount of debris within the right mainstem bronchus. There is no pneumothorax. No large pleural effusion. No focal infiltrate. There is elevation of the left hemidiaphragm with atelectasis at the left lung base. Upper Abdomen: There are probable nonobstructing stones in the upper pole the left kidney. There is a cyst arising from the  upper pole of the right kidney. Musculoskeletal: No chest wall abnormality. No acute or significant osseous findings. There are old healed right-sided rib fractures. Review of the MIP images confirms the above findings. IMPRESSION: 1. No CT evidence for acute pulmonary embolus. 2. Moderate amount of debris within the right mainstem bronchus, which may indicate aspiration. 3. Elevation of the left hemidiaphragm with atelectasis at the left lung base. No focal infiltrate. 4. Aortic Atherosclerosis (ICD10-I70.0). Electronically  Signed   By: Constance Holster M.D.   On: 05/07/2019 16:10   US Venous Img Lower Bilateral (DVT)  Result Date: 05/07/2019 CLINICAL DATA:  Edema EXAM: BILATERAL LOWER EXTREMITY VENOUS DOPPLER ULTRASOUND TECHNIQUE: Gray-scale sonography with compression, as well as color and duplex ultrasound, were performed to evaluate the deep venous system(s) from the level of the common femoral vein through the popliteal and proximal calf veins. COMPARISON:  02/08/2012 FINDINGS: VENOUS Normal compressibility of the common femoral, superficial femoral, and popliteal veins, as well as the visualized calf veins. Visualized portions of profunda femoral vein and great saphenous vein unremarkable. No filling defects to suggest DVT on grayscale or color Doppler imaging. Doppler waveforms show normal direction of venous flow, normal respiratory phasicity and response to augmentation. Calf veins are poorly visualized bilaterally. OTHER None. Limitations: none IMPRESSION: No femoropopliteal DVT nor evidence of DVT within the visualized calf veins. If clinical symptoms are inconsistent or if there are persistent or worsening symptoms, further imaging (possibly involving the iliac veins) may be warranted. Electronically Signed   By: Lavonia Dana M.D.   On: 05/07/2019 18:01   ECHOCARDIOGRAM COMPLETE  Result Date: 05/08/2019    ECHOCARDIOGRAM REPORT   Patient Name:   KINDELL KREITLER Date of Exam: 05/08/2019 Medical  Rec #:  [id number]       Height:       72.0 in Accession #:    [id number]      Weight:       183.6 lb Date of Birth:  11/09/1945        BSA:          2.054 m Patient Age:    35 years        BP:           178/85 mmHg Patient Gender: M               HR:           101 bpm. Exam Location:  [facility] Procedure: 2D Echo, Cardiac Doppler and Color Doppler Indications:     Elevated troponin  History:         Patient has no prior history of Echocardiogram examinations.                  Risk Factors:Hypertension.  Sonographer:     Sherrie Sport RDCS (AE) Referring Phys:  [id number] Alben Deeds JR Diagnosing Phys: Bartholome Bill MD  Sonographer Comments: Technically difficult study due to poor echo windows. IMPRESSIONS  1. Dextrocardia. Left ventricular ejection fraction, by estimation, is 50 to 55%. The left ventricle has low normal function. The left ventricle has no regional wall motion abnormalities. Left ventricular diastolic parameters were normal.  2. Right ventricular systolic function is normal. The right ventricular size is normal.  3. The mitral valve was not well visualized. Trivial mitral valve regurgitation.  4. The aortic valve was not well visualized. Aortic valve regurgitation is not visualized. FINDINGS  Left Ventricle: Dextrocardia. Left ventricular ejection fraction, by estimation, is 50 to 55%. The left ventricle has low normal function. The left ventricle has no regional wall motion abnormalities. The left ventricular internal cavity size was normal  in size. There is borderline left ventricular hypertrophy. Left ventricular diastolic parameters were normal. Right Ventricle: The right ventricular size is normal. No increase in right ventricular wall thickness. Right ventricular systolic function is normal. Left Atrium: Left atrial size was normal in size. Right Atrium: Right atrial size was normal in  size. Pericardium: There is no evidence of pericardial effusion. Mitral Valve: The mitral valve was not well  visualized. Trivial mitral valve regurgitation. Tricuspid Valve: The tricuspid valve is not well visualized. Tricuspid valve regurgitation is mild. Aortic Valve: The aortic valve was not well visualized. Aortic valve regurgitation is not visualized. Pulmonic Valve: The pulmonic valve was not well visualized. Pulmonic valve regurgitation is not visualized. Aorta: The aortic root was not well visualized. IAS/Shunts: The interatrial septum was not assessed.  LEFT VENTRICLE PLAX 2D LVIDd:         4.21 cm LVIDs:         3.29 cm LV PW:         1.34 cm LV IVS:        1.39 cm LVOT diam:     2.20 cm LVOT Area:     3.80 cm  LEFT ATRIUM         Index LA diam:    2.90 cm 1.41 cm/m                        PULMONIC VALVE AORTA                 PV Vmax:        0.90 m/s Ao Root diam: 3.00 cm PV Peak grad:   3.2 mmHg                       RVOT Peak grad: 4 mmHg   SHUNTS Systemic Diam: 2.20 cm Bartholome Bill MD Electronically signed by Bartholome Bill MD Signature Date/Time: 05/08/2019/12:23:08 PM    Final     Assessment and plan-  Patient is a 74 y.o. male presented for right ankle pain. Work-up showed  thrombocytosis and leukocytosis.  #Thrombocytosis, platelet count trended down to 730 Likely reactive due to infection inflammation. Possible iron insufficient erythropoiesis which may also contribute to his thrombocytosis. If thrombocytosis persists after his pneumonia resolves, I will obtain myeloproliferative work-up including JAK2 mutations with reflex to MPL/CARL.   #aspiration pneumonia, procalcitonin not elevated, resumed on antibiotics due to persistent leukocytosis.  Leukocytosis trending down. Patient can follow-up with me 1 week after discharge for further evaluation and management of thrombocytosis. Thank you for allowing me to participate in the care of this patient.   Earlie Server, MD, PhD Hematology Oncology [facility] Pager- [phone] 05/10/2019

## 2019-05-10 NOTE — Progress Notes (Addendum)
PROGRESS NOTE    Marc Smith  [id number] DOB: 05-Aug-1945 DOA: 05/07/2019 PCP: Lavera Guise, MD    Assessment & Plan:   Active Problems:   Tachycardia   Elevated troponin I level   Acute pain of right lower extremity    Marc Smith is a 74 y.o. AA male with medical history significant of HTN, gout, HLD who presented with RLE pain.   Aspiration Pneumonia Sepsis ruled out CT notable for debris in R mainstem, however, procal not elevated, and pt not febrile or hypoxic.  But pt did have significant leukocytosis which improved after starting abx, so will treat for aspiration PNA with a course of abx. --SLP, noted no overt clinical s/s of aspiration, upgraded to Dysphagia level 3, Thin liquids VIA CUP.  --Received 2 days of Unasyn and transitioned to Augmentin. PLAN: --continue Augmentin for a 5-day course  Elevated Troponin:  suspect this is demand.  Trop peaked at 43. --cardiology consulted, recommended d/c heparin and no further ischemic workup.  Sinus tachycardia --continue coreg 25 mg BID   Right Ankle Pain  Right Lower Extremity Swelling, resolved pt suspected this may be gout,  -- Ankle plain film wnl. --LE [place] (negative for DVT) --Continue allopurinol and colchicine  Colonic distention, resolved KUB showed mod stool in the rectum and colonic distention.  Pt noted to be incontinent of liquid stool on 3/17. --repeat KUB on 3/17 showed resolution of colonic distention.   Thrombocytosis --consulted heme on admission, suspect reactive  --follow up with Dr. Tasia Catchings 1 week after discharge  Hypertension:  continue amlodipine, coreg.   Hold lisinopril for now.  HLD: continue atorvastatin  Stool and urine incontinence   Not taking zoloft per nephew   DVT prophylaxis: Lovenox SQ Code Status: Full code  Family Communication: attempted to call 2 family members, but both no answer Disposition Plan: Medically ready for discharge, to SNF when bed  available.   Subjective and Interval History:  No complaints today.  Pt said he didn't like the food here.  No fever, dyspnea, N/V.  Pt is continent of both urine and stool.   Objective: Vitals:   05/10/19 0447 05/10/19 0920 05/10/19 1219 05/10/19 1550  BP: (!) 156/96 (!) 153/78 127/87 123/81  Pulse: 72 60 67 64  Resp: 19  16 18   Temp: 98.5 F (36.9 C)  97.6 F (36.4 C) 98.2 F (36.8 C)  TempSrc: Oral  Oral Oral  SpO2: 99%  100% 100%  Weight: 82.9 kg     Height:        Intake/Output Summary (Last 24 hours) at 05/10/2019 1630 Last data filed at 05/10/2019 1426 Gross per 24 hour  Intake 480 ml  Output 1100 ml  Net -620 ml   Filed Weights   05/08/19 0517 05/09/19 0401 05/10/19 0447  Weight: 83.3 kg 83 kg 82.9 kg    Examination:   Constitutional: NAD, alert, oriented to self HEENT: conjunctivae and lids normal, EOMI CV: RRR no M,R,G. Distal pulses +2.  No cyanosis.   RESP: breath sounds more clear today, normal respiratory effort  GI: +BS, NTND, sitting in his own urine this time Extremities: No effusions, edema, or tenderness in BLE SKIN: warm, dry and intact Neuro: II - XII grossly intact.     Data Reviewed: I have personally reviewed following labs and imaging studies  CBC: Recent Labs  Lab 05/07/19 1123 05/07/19 1537 05/08/19 0302 05/09/19 0607 05/10/19 0454  WBC 15.0* 15.4* 13.8* 8.4 8.2  NEUTROABS  12.1* 12.0*  --   --   --   HGB 14.0 10.9* 10.7* 9.3* 10.0*  HCT 42.5 32.9* 32.4* 28.5* 30.0*  MCV 84.8 85.0 85.0 85.8 84.7  PLT 966* 797* 747* 681* [id number]*   Basic Metabolic Panel: Recent Labs  Lab 05/07/19 1123 05/09/19 0607 05/10/19 0454  NA 139 138 139  K 3.4* 2.8* 3.3*  CL 95* 98 101  CO2 29 28 28   GLUCOSE 118* 86 95  BUN 16 16 13   CREATININE 1.09 0.99 0.95  CALCIUM 9.3 7.9* 8.1*  MG  --  1.8 2.0   GFR: Estimated Creatinine Clearance: 76 mL/min (by C-G formula based on SCr of 0.95 mg/dL). Liver Function Tests: Recent Labs  Lab  05/07/19 1123  AST 22  ALT 12  ALKPHOS 68  BILITOT 1.6*  PROT 9.0*  ALBUMIN 3.6   No results for input(s): LIPASE, AMYLASE in the last 168 hours. No results for input(s): AMMONIA in the last 168 hours. Coagulation Profile: Recent Labs  Lab 05/07/19 1123  INR 1.1   Cardiac Enzymes: No results for input(s): CKTOTAL, CKMB, CKMBINDEX, TROPONINI in the last 168 hours. BNP (last 3 results) No results for input(s): PROBNP in the last 8760 hours. HbA1C: Recent Labs    05/08/19 0302  HGBA1C 5.5   CBG: No results for input(s): GLUCAP in the last 168 hours. Lipid Profile: Recent Labs    05/08/19 0302  CHOL 124  HDL 33*  LDLCALC 81  TRIG 52  CHOLHDL 3.8   Thyroid Function Tests: No results for input(s): TSH, T4TOTAL, FREET4, T3FREE, THYROIDAB in the last 72 hours. Anemia Panel: Recent Labs    05/08/19 0302  FERRITIN 246  TIBC 146*  IRON 31*  RETICCTPCT 1.2   Sepsis Labs: Recent Labs  Lab 05/07/19 1123 05/07/19 1341 05/08/19 0827  PROCALCITON  --   --  0.12  LATICACIDVEN 2.2* 2.3* 1.1    Recent Results (from the past 240 hour(s))  Culture, blood (Routine x 2)     Status: None (Preliminary result)   Collection Time: 05/07/19 11:23 AM   Specimen: BLOOD  Result Value Ref Range Status   Specimen Description BLOOD RIGHT ANTECUBITAL  Final   Special Requests   Final    BOTTLES DRAWN AEROBIC AND ANAEROBIC Blood Culture adequate volume   Culture   Final    NO GROWTH 3 DAYS Performed at [facility], [address], [place], [place] [address]    Report Status PENDING  Incomplete  Culture, blood (Routine x 2)     Status: None (Preliminary result)   Collection Time: 05/07/19 11:23 AM   Specimen: BLOOD  Result Value Ref Range Status   Specimen Description BLOOD BLOOD RIGHT HAND  Final   Special Requests   Final    BOTTLES DRAWN AEROBIC AND ANAEROBIC Blood Culture adequate volume   Culture   Final    NO GROWTH 3 DAYS Performed at [facility], [address], [place], [place] [address]    Report Status PENDING  Incomplete  Respiratory Panel by RT PCR (Flu A&B, Covid) - Nasopharyngeal Swab     Status: None   Collection Time: 05/07/19  1:52 PM   Specimen: Nasopharyngeal Swab  Result Value Ref Range Status   SARS Coronavirus 2 by RT PCR NEGATIVE NEGATIVE Final    Comment: (NOTE) SARS-CoV-2 target nucleic acids are NOT DETECTED. The SARS-CoV-2 RNA is generally detectable in upper respiratoy specimens during the acute phase of infection. The  lowest concentration of SARS-CoV-2 viral copies this assay can detect is 131 copies/mL. A negative result does not preclude SARS-Cov-2 infection and should not be used as the sole basis for treatment or other patient management decisions. A negative result may occur with  improper specimen collection/handling, submission of specimen other than nasopharyngeal swab, presence of viral mutation(s) within the areas targeted by this assay, and inadequate number of viral copies (<131 copies/mL). A negative result must be combined with clinical observations, patient history, and epidemiological information. The expected result is Negative. Fact Sheet for Patients:  PinkCheek.be Fact Sheet for Healthcare Providers:  GravelBags.it This test is not yet ap proved or cleared by the [place] FDA and  has been authorized for detection and/or diagnosis of SARS-CoV-2 by FDA under an Emergency Use Authorization (EUA). This EUA will remain  in effect (meaning this test can be used) for the duration of the COVID-19 declaration under Section 564(b)(1) of the Act, 21 U.S.C. section 360bbb-3(b)(1), unless the authorization is terminated or revoked sooner.    Influenza A by PCR NEGATIVE NEGATIVE Final   Influenza B by PCR NEGATIVE NEGATIVE Final    Comment: (NOTE) The Xpert Xpress SARS-CoV-2/FLU/RSV assay is intended as an aid in  the  diagnosis of influenza from Nasopharyngeal swab specimens and  should not be used as a sole basis for treatment. Nasal washings and  aspirates are unacceptable for Xpert Xpress SARS-CoV-2/FLU/RSV  testing. Fact Sheet for Patients: PinkCheek.be Fact Sheet for Healthcare Providers: GravelBags.it This test is not yet approved or cleared by the [place] FDA and  has been authorized for detection and/or diagnosis of SARS-CoV-2 by  FDA under an Emergency Use Authorization (EUA). This EUA will remain  in effect (meaning this test can be used) for the duration of the  Covid-19 declaration under Section 564(b)(1) of the Act, 21  U.S.C. section 360bbb-3(b)(1), unless the authorization is  terminated or revoked. Performed at [facility], [address] [place], [place] [address]   MRSA PCR Screening     Status: None   Collection Time: 05/09/19 12:45 AM   Specimen: Nasopharyngeal  Result Value Ref Range Status   MRSA by PCR NEGATIVE NEGATIVE Final    Comment:        The GeneXpert MRSA Assay (FDA approved for NASAL specimens only), is one component of a comprehensive MRSA colonization surveillance program. It is not intended to diagnose MRSA infection nor to guide or monitor treatment for MRSA infections. Performed at [facility], [address], [place], [place] [address]       Radiology Studies: DG Abd 1 View  Result Date: 05/08/2019 CLINICAL DATA:  Impacted stool EXAM: ABDOMEN - 1 VIEW COMPARISON:  05/07/2019 FINDINGS: Mild diffuse increased bowel gas throughout. No definitive obstructive pattern. Mild stool at the rectum. Vascular calcifications IMPRESSION: Overall nonobstructed gas pattern. Small amount of retained stool at the rectum Electronically Signed   By: Donavan Foil M.D.   On: 05/08/2019 19:55     Scheduled Meds: . allopurinol  100 mg Oral Daily  . amLODipine  10 mg Oral Daily  .  amoxicillin-clavulanate  1 tablet Oral Q12H  . aspirin  81 mg Oral Daily  . atorvastatin  40 mg Oral Daily  . carvedilol  25 mg Oral BID WC  . colchicine  0.6 mg Oral Daily  . enoxaparin (LOVENOX) injection  40 mg Subcutaneous Q24H   Continuous Infusions:    LOS: 2 days     Otila Kluver  Billie Ruddy, MD [facility] If 7PM-7AM, please contact night-coverage 05/10/2019, 4:30 PM

## 2019-05-10 NOTE — Care Management Important Message (Signed)
Important Message  Patient Details  Name: Marc Smith MRN: [id number] Date of Birth: 11/08/1945   Medicare Important Message Given:  Yes  Initial Medicare IM given by Patient Access Associate on 05/09/2019 at 1:46pm.  Still valid.   Dannette Barbara 05/10/2019, 8:45 AM

## 2019-05-10 NOTE — Progress Notes (Signed)
  Speech Language Pathology Treatment: Dysphagia  Patient Details Name: Marc Smith MRN: [id number] DOB: June 20, 1945 Today's Date: 05/10/2019 Time: 1333-1410 SLP Time Calculation (min) (ACUTE ONLY): 37 min  Assessment / Plan / Recommendation Clinical Impression  Pt seen another day for ongoing assessment of swallowing and toleration of diet. The diet recommended by this SLP is a Mech Soft/Minced diet in order that foods be cooked, soft and Cut for ease of mastication d/t few missing Dentition AND decreased attention during tasks.  Ptassisted in sitting upright in bed for safe oral intake. He exhibits constant leaning and does not immediately sit himself upright. Heconsumed trials of thin liquidsvia Straw and broken down/moist foods w/ No significant oral phase deficits formashing/gumming/chewing of the foods. Oral phase was grossly [facility] for the time needed for bolus management and breaking down the soft solids for swallowing w/ the baseline of few missing Dentition.He endorsed being able to eat the soft foods and denied any coughing or choking feelings today at Lunch meal; w/ current po trials. Pt demonstrated no overt s/s of aspiration w/ the sips of thin liquids, soft solids -- clear vocal quality b/t trials. Pt fed self w/ setup support. Recommendcontinue diet of soft,cooked foods Cut well (for ease of mastication)w/ added purees in diet; thin liquids. Recommend general aspiration precautions,recommend Pills given in Puree whole vs crushed for ease of swallowing/clearing.NSG to assist pt at meals for tray setup and support w/ feeding as needed.Pt appears to be potientiallyat his baseline re: swallowing from an oropharyngeal phase standpointin light of few missing Dentition and Cognitive status (suspect Cognitive decline d/t limited engagement; previous Head CT in 2018 indicating "Severe white matter disease" then.ST services can be available for further assessment/needs while admitted  per NSG reconsult. Handouts left in room re: general aspiration precautions and diet consistency. NSG updated.     HPI HPI: Pt  is a 74 y.o. male with medical history significant of HTN, gout, HLD presenting with RLE pain. History is obtained with assistance from nephew and chart.  Patient notes he's had R ankle pain for about a week.  Suspects it's due to gout.  Describes just as pain.  No other complaints.  No fevers, chest pain, SOB.  Nephew notes he's been sitting in chair for a few days, but couldn't get up due to pain so they brought him to hospital. CXR: "Ill-defined opacity in each lung base, concerning for atypical organism pneumonia. No consolidation. Lungs elsewhere clear".       SLP Plan  All goals met       Recommendations  Diet recommendations: Dysphagia 2 (fine chop);Dysphagia 3 (mechanical soft);Thin liquid Liquids provided via: Cup;Straw(monitor) Medication Administration: Whole meds with puree(for safer swallowing d/t Cognitive decline) Supervision: Patient able to self feed;Intermittent supervision to cue for compensatory strategies Compensations: Minimize environmental distractions;Slow rate;Small sips/bites;Lingual sweep for clearance of pocketing;Multiple dry swallows after each bite/sip;Follow solids with liquid Postural Changes and/or Swallow Maneuvers: Seated upright 90 degrees;Upright 30-60 min after meal                General recommendations: (Dietician f/u for support) Oral Care Recommendations: Oral care BID;Oral care before and after PO;Staff/trained caregiver to provide oral care Follow up Recommendations: None SLP Visit Diagnosis: Dysphagia, oral phase (R13.11)(Cognitive decline noted) Plan: All goals met       GO                 Marc Kenner, MS, CCC-SLP Marc Smith 05/10/2019, 2:47 PM

## 2019-05-10 NOTE — TOC Progression Note (Signed)
Transition of Care [facility]) - Progression Note    Patient Details  Name: Marc Smith MRN: [id number] Date of Birth: [birth year]  [facility] CM/SW Contact  Eileen Stanford, LCSW Phone Number: 05/10/2019, 3:21 PM  Clinical Narrative:   [facility] has [facility] is reviewing pt's referral. Pt will need prior auth when decision is made.    Expected Discharge Plan: [place] Barriers to Discharge: Continued Medical Work up  Expected Discharge Plan and Services Expected Discharge Plan: [place] In-house Referral: Clinical Social Work   Post Acute Care Choice: [place] Living arrangements for the past 2 months: Single Family Home                                       Social Determinants of Health (SDOH) Interventions    Readmission Risk Interventions No flowsheet data found.

## 2019-05-11 LAB — CBC
HCT: 30.3 % — ABNORMAL LOW (ref 39.0–52.0)
Hemoglobin: 9.9 g/dL — ABNORMAL LOW (ref 13.0–17.0)
MCH: 27.9 pg (ref 26.0–34.0)
MCHC: 32.7 g/dL (ref 30.0–36.0)
MCV: 85.4 fL (ref 80.0–100.0)
Platelets: 735 10*3/uL — ABNORMAL HIGH (ref 150–400)
RBC: 3.55 MIL/uL — ABNORMAL LOW (ref 4.22–5.81)
RDW: 13.8 % (ref 11.5–15.5)
WBC: 8.1 10*3/uL (ref 4.0–10.5)
nRBC: 0 % (ref 0.0–0.2)

## 2019-05-11 LAB — BASIC METABOLIC PANEL
Anion gap: 6 (ref 5–15)
BUN: 11 mg/dL (ref 8–23)
CO2: 29 mmol/L (ref 22–32)
Calcium: 8.2 mg/dL — ABNORMAL LOW (ref 8.9–10.3)
Chloride: 101 mmol/L (ref 98–111)
Creatinine, Ser: 0.83 mg/dL (ref 0.61–1.24)
GFR calc Af Amer: >60 mL/min (ref >60)
GFR calc non Af Amer: >60 mL/min (ref >60)
Glucose, Bld: 97 mg/dL (ref 70–99)
Potassium: 3.4 mmol/L — ABNORMAL LOW (ref 3.5–5.1)
Sodium: 136 mmol/L (ref 135–145)

## 2019-05-11 LAB — SARS CORONAVIRUS 2 (TAT 6-24 HRS): SARS Coronavirus 2: NEGATIVE

## 2019-05-11 LAB — MAGNESIUM: Magnesium: 2 mg/dL (ref 1.7–2.4)

## 2019-05-11 MED ORDER — POTASSIUM CHLORIDE CRYS ER 20 MEQ PO TBCR
40.0000 meq | EXTENDED_RELEASE_TABLET | Freq: Once | ORAL | Status: AC
  Administered 2019-05-11: 09:00:00 40 meq via ORAL
  Filled 2019-05-11: qty 2

## 2019-05-11 MED ORDER — ENSURE ENLIVE PO LIQD
237.0000 mL | Freq: Two times a day (BID) | ORAL | Status: DC
  Administered 2019-05-12 – 2019-05-14 (×5): 237 mL via ORAL

## 2019-05-11 NOTE — Progress Notes (Signed)
PROGRESS NOTE    Marc Smith  [id number] DOB: 1945/04/18 DOA: 05/07/2019 PCP: Lavera Guise, MD    Assessment & Plan:   Active Problems:   Tachycardia   Elevated troponin I level   Acute pain of right lower extremity    Marc Smith is a 74 y.o. AA male with medical history significant of HTN, gout, HLD who presented with RLE pain.   Aspiration Pneumonia Sepsis ruled out CT notable for debris in R mainstem, however, procal not elevated, and pt not febrile or hypoxic.  But pt did have significant leukocytosis which improved after starting abx, so will treat for aspiration PNA with a course of abx. --SLP, noted no overt clinical s/s of aspiration, upgraded to Dysphagia level 3, Thin liquids VIA CUP.  --Received 2 days of Unasyn and transitioned to Augmentin. PLAN: --continue Augmentin for a 5-day course  Elevated Troponin:  suspect this is demand.  Trop peaked at 53. --cardiology consulted, recommended d/c heparin and no further ischemic workup.  Sinus tachycardia --continue coreg 25 mg BID   Right Ankle Pain  Right Lower Extremity Swelling, resolved pt suspected this may be gout,  -- Ankle plain film wnl. --LE [place] (negative for DVT) --Continue allopurinol and colchicine  Colonic distention, resolved KUB showed mod stool in the rectum and colonic distention.  Pt noted to be incontinent of liquid stool on 3/17. --repeat KUB on 3/17 showed resolution of colonic distention.   Thrombocytosis --consulted heme on admission, suspect reactive  --follow up with Dr. Tasia Catchings 1 week after discharge  Hypertension:  continue amlodipine, coreg.   Hold lisinopril for now.  HLD: continue atorvastatin  Stool and urine incontinence   Not taking zoloft per nephew   DVT prophylaxis: Lovenox SQ Code Status: Full code  Family Communication: Updated niece on the phone today. Disposition Plan: Medically ready for discharge, to SNF when bed available.   Subjective and  Interval History:  Pt reported doing fine.  No complaints.  Still didn't like the food here, but agreed to drink Ensure.  No fever, dyspnea, pain, N/V.   Objective: Vitals:   05/11/19 0500 05/11/19 0731 05/11/19 1158 05/11/19 1649  BP:  (!) 145/76 115/61 124/84  Pulse:  62 66 72  Resp:  20 16 16   Temp:  98.2 F (36.8 C) 97.8 F (36.6 C) 98 F (36.7 C)  TempSrc:  Oral    SpO2:  97% 99% 99%  Weight: 82.4 kg     Height:        Intake/Output Summary (Last 24 hours) at 05/11/2019 1746 Last data filed at 05/11/2019 0500 Gross per 24 hour  Intake --  Output 502 ml  Net -502 ml   Filed Weights   05/09/19 0401 05/10/19 0447 05/11/19 0500  Weight: 83 kg 82.9 kg 82.4 kg    Examination:   Constitutional: NAD, alert, oriented to self HEENT: conjunctivae and lids normal, EOMI CV: RRR no M,R,G. Distal pulses +2.  No cyanosis.   RESP: breath sounds more clear today, normal respiratory effort  GI: +BS, NTND Extremities: No effusions, edema, or tenderness in BLE SKIN: warm, dry and intact Neuro: II - XII grossly intact.     Data Reviewed: I have personally reviewed following labs and imaging studies  CBC: Recent Labs  Lab 05/07/19 1123 05/07/19 1123 05/07/19 1537 05/08/19 0302 05/09/19 0607 05/10/19 0454 05/11/19 0512  WBC 15.0*   < > 15.4* 13.8* 8.4 8.2 8.1  NEUTROABS 12.1*  --  12.0*  --   --   --   --  HGB 14.0   < > 10.9* 10.7* 9.3* 10.0* 9.9*  HCT 42.5   < > 32.9* 32.4* 28.5* 30.0* 30.3*  MCV 84.8   < > 85.0 85.0 85.8 84.7 85.4  PLT 966*   < > 797* 747* 681* 730* 735*   < > = values in this interval not displayed.   Basic Metabolic Panel: Recent Labs  Lab 05/07/19 1123 05/09/19 0607 05/10/19 0454 05/11/19 0512  NA 139 138 139 136  K 3.4* 2.8* 3.3* 3.4*  CL 95* 98 101 101  CO2 29 28 28 29   GLUCOSE 118* 86 95 97  BUN 16 16 13 11   CREATININE 1.09 0.99 0.95 0.83  CALCIUM 9.3 7.9* 8.1* 8.2*  MG  --  1.8 2.0 2.0   GFR: Estimated Creatinine Clearance: 87  mL/min (by C-G formula based on SCr of 0.83 mg/dL). Liver Function Tests: Recent Labs  Lab 05/07/19 1123  AST 22  ALT 12  ALKPHOS 68  BILITOT 1.6*  PROT 9.0*  ALBUMIN 3.6   No results for input(s): LIPASE, AMYLASE in the last 168 hours. No results for input(s): AMMONIA in the last 168 hours. Coagulation Profile: Recent Labs  Lab 05/07/19 1123  INR 1.1   Cardiac Enzymes: No results for input(s): CKTOTAL, CKMB, CKMBINDEX, TROPONINI in the last 168 hours. BNP (last 3 results) No results for input(s): PROBNP in the last 8760 hours. HbA1C: No results for input(s): HGBA1C in the last 72 hours. CBG: No results for input(s): GLUCAP in the last 168 hours. Lipid Profile: No results for input(s): CHOL, HDL, LDLCALC, TRIG, CHOLHDL, LDLDIRECT in the last 72 hours. Thyroid Function Tests: No results for input(s): TSH, T4TOTAL, FREET4, T3FREE, THYROIDAB in the last 72 hours. Anemia Panel: No results for input(s): VITAMINB12, FOLATE, FERRITIN, TIBC, IRON, RETICCTPCT in the last 72 hours. Sepsis Labs: Recent Labs  Lab 05/07/19 1123 05/07/19 1341 05/08/19 0827  PROCALCITON  --   --  0.12  LATICACIDVEN 2.2* 2.3* 1.1    Recent Results (from the past 240 hour(s))  Culture, blood (Routine x 2)     Status: None (Preliminary result)   Collection Time: 05/07/19 11:23 AM   Specimen: BLOOD  Result Value Ref Range Status   Specimen Description BLOOD RIGHT ANTECUBITAL  Final   Special Requests   Final    BOTTLES DRAWN AEROBIC AND ANAEROBIC Blood Culture adequate volume   Culture   Final    NO GROWTH 4 DAYS Performed at [facility] [address], [place], [place] [address]    Report Status PENDING  Incomplete  Culture, blood (Routine x 2)     Status: None (Preliminary result)   Collection Time: 05/07/19 11:23 AM   Specimen: BLOOD  Result Value Ref Range Status   Specimen Description BLOOD BLOOD RIGHT HAND  Final   Special Requests   Final    BOTTLES DRAWN AEROBIC AND  ANAEROBIC Blood Culture adequate volume   Culture   Final    NO GROWTH 4 DAYS Performed at [facility], [address] [place], [place] [address]    Report Status PENDING  Incomplete  Respiratory Panel by RT PCR (Flu A&B, Covid) - Nasopharyngeal Swab     Status: None   Collection Time: 05/07/19  1:52 PM   Specimen: Nasopharyngeal Swab  Result Value Ref Range Status   SARS Coronavirus 2 by RT PCR NEGATIVE NEGATIVE Final    Comment: (NOTE) SARS-CoV-2 target nucleic acids are NOT DETECTED. The SARS-CoV-2 RNA is  generally detectable in upper respiratoy specimens during the acute phase of infection. The lowest concentration of SARS-CoV-2 viral copies this assay can detect is 131 copies/mL. A negative result does not preclude SARS-Cov-2 infection and should not be used as the sole basis for treatment or other patient management decisions. A negative result may occur with  improper specimen collection/handling, submission of specimen other than nasopharyngeal swab, presence of viral mutation(s) within the areas targeted by this assay, and inadequate number of viral copies (<131 copies/mL). A negative result must be combined with clinical observations, patient history, and epidemiological information. The expected result is Negative. Fact Sheet for Patients:  PinkCheek.be Fact Sheet for Healthcare Providers:  GravelBags.it This test is not yet ap proved or cleared by the [place] FDA and  has been authorized for detection and/or diagnosis of SARS-CoV-2 by FDA under an Emergency Use Authorization (EUA). This EUA will remain  in effect (meaning this test can be used) for the duration of the COVID-19 declaration under Section 564(b)(1) of the Act, 21 U.S.C. section 360bbb-3(b)(1), unless the authorization is terminated or revoked sooner.    Influenza A by PCR NEGATIVE NEGATIVE Final   Influenza B by PCR NEGATIVE  NEGATIVE Final    Comment: (NOTE) The Xpert Xpress SARS-CoV-2/FLU/RSV assay is intended as an aid in  the diagnosis of influenza from Nasopharyngeal swab specimens and  should not be used as a sole basis for treatment. Nasal washings and  aspirates are unacceptable for Xpert Xpress SARS-CoV-2/FLU/RSV  testing. Fact Sheet for Patients: PinkCheek.be Fact Sheet for Healthcare Providers: GravelBags.it This test is not yet approved or cleared by the [place] FDA and  has been authorized for detection and/or diagnosis of SARS-CoV-2 by  FDA under an Emergency Use Authorization (EUA). This EUA will remain  in effect (meaning this test can be used) for the duration of the  Covid-19 declaration under Section 564(b)(1) of the Act, 21  U.S.C. section 360bbb-3(b)(1), unless the authorization is  terminated or revoked. Performed at [facility], [address] [place], [place] [address]   MRSA PCR Screening     Status: None   Collection Time: 05/09/19 12:45 AM   Specimen: Nasopharyngeal  Result Value Ref Range Status   MRSA by PCR NEGATIVE NEGATIVE Final    Comment:        The GeneXpert MRSA Assay (FDA approved for NASAL specimens only), is one component of a comprehensive MRSA colonization surveillance program. It is not intended to diagnose MRSA infection nor to guide or monitor treatment for MRSA infections. Performed at [facility], [address] [place], [place] [address]       Radiology Studies: No results found.   Scheduled Meds: . allopurinol  100 mg Oral Daily  . amLODipine  10 mg Oral Daily  . amoxicillin-clavulanate  1 tablet Oral Q12H  . aspirin  81 mg Oral Daily  . atorvastatin  40 mg Oral Daily  . carvedilol  25 mg Oral BID WC  . colchicine  0.6 mg Oral Daily  . enoxaparin (LOVENOX) injection  40 mg Subcutaneous Q24H   Continuous Infusions:    LOS: 3 days     Enzo Bi,  MD [facility] If 7PM-7AM, please contact night-coverage 05/11/2019, 5:46 PM

## 2019-05-11 NOTE — TOC Progression Note (Addendum)
Transition of Care [facility]) - Progression Note    Patient Details  Name: Marc Smith MRN: [id number] Date of Birth: [birth year]  [facility] CM/SW Contact  Marshell Garfinkel, RN Phone Number: 05/11/2019, 12:53 PM  Clinical Narrative:     Covid test requested; message left for [employer] regarding status of SNF authorization.Update at 1315: Callback from [place] ref [id number]- authorization still pending.   Expected Discharge Plan: [place] Barriers to Discharge: Continued Medical Work up  Expected Discharge Plan and Services Expected Discharge Plan: [place] In-house Referral: Clinical Social Work   Post Acute Care Choice: [place] Living arrangements for the past 2 months: Single Family Home                                       Social Determinants of Health (SDOH) Interventions    Readmission Risk Interventions No flowsheet data found.

## 2019-05-12 DIAGNOSIS — R32 Unspecified urinary incontinence: Secondary | ICD-10-CM | POA: Diagnosis present

## 2019-05-12 DIAGNOSIS — E785 Hyperlipidemia, unspecified: Secondary | ICD-10-CM | POA: Diagnosis present

## 2019-05-12 DIAGNOSIS — R54 Age-related physical debility: Secondary | ICD-10-CM | POA: Diagnosis present

## 2019-05-12 DIAGNOSIS — R159 Full incontinence of feces: Secondary | ICD-10-CM | POA: Diagnosis present

## 2019-05-12 LAB — CULTURE, BLOOD (ROUTINE X 2)
Culture: NO GROWTH
Culture: NO GROWTH
Special Requests: ADEQUATE
Special Requests: ADEQUATE

## 2019-05-12 MED ORDER — POTASSIUM CHLORIDE CRYS ER 20 MEQ PO TBCR
40.0000 meq | EXTENDED_RELEASE_TABLET | Freq: Every day | ORAL | Status: DC
  Administered 2019-05-12 – 2019-05-14 (×3): 40 meq via ORAL
  Filled 2019-05-12 (×3): qty 2

## 2019-05-12 NOTE — Progress Notes (Signed)
PROGRESS NOTE    BECKETT Smith  [id number] DOB: 1945-10-06 DOA: 05/07/2019 PCP: Lavera Guise, MD    Assessment & Plan:   Active Problems:   Tachycardia   Elevated troponin I level   Acute pain of right lower extremity    Marc Smith is a 74 y.o. AA male with medical history significant of HTN, gout, HLD who presented with RLE pain.   Aspiration Pneumonia, treated Sepsis ruled out CT notable for debris in R mainstem, however, procal not elevated, and pt not febrile or hypoxic.  But pt did have significant leukocytosis which improved after starting abx, so will treat for aspiration PNA with a course of abx. --SLP, noted no overt clinical s/s of aspiration, upgraded to Dysphagia level 3, Thin liquids VIA CUP.  --Received 2 days of Unasyn and transitioned to Augmentin for a 5-day course, completed.  Elevated Troponin:  suspect this is demand.  Trop peaked at 5. --cardiology consulted, recommended d/c heparin and no further ischemic workup.  Sinus tachycardia --continue coreg 25 mg BID   Right Ankle Pain  Right Lower Extremity Swelling, likely due to gout, resolved pt suspected this may be gout,  --Ankle plain film wnl. --LE [place] (negative for DVT) --Continue allopurinol and colchicine  Colonic distention, resolved KUB showed mod stool in the rectum and colonic distention.  Pt noted to be incontinent of liquid stool on 3/17. --repeat KUB on 3/17 showed resolution of colonic distention.   Thrombocytosis --consulted heme on admission, suspect reactive  --follow up with Dr. Tasia Catchings 1 week after discharge  Hypertension:  continue amlodipine, coreg.   Hold lisinopril for now.  HLD: continue atorvastatin  Stool and urine incontinence   Not taking zoloft per nephew   DVT prophylaxis: Lovenox SQ Code Status: Full code  Family Communication:  Disposition Plan: Medically ready for discharge, to SNF when bed available.   Subjective and Interval History:  Pt  reported liking his food today, was seen feeding himself.  Denied pain or dyspnea.  No fever, N/V.  Still incontinent of both urine and stools.   Objective: Vitals:   05/11/19 2030 05/12/19 0417 05/12/19 0810 05/12/19 1237  BP: 136/72 (!) 154/88 (!) 147/86 123/76  Pulse: 60 76 70 69  Resp: 20 20 16 16   Temp: 98 F (36.7 C) 98 F (36.7 C) 98.4 F (36.9 C) 98.1 F (36.7 C)  TempSrc: Oral Oral Oral   SpO2: 100% 100% 100% 100%  Weight:  78.9 kg    Height:       No intake or output data in the 24 hours ending 05/12/19 1449 Filed Weights   05/10/19 0447 05/11/19 0500 05/12/19 0417  Weight: 82.9 kg 82.4 kg 78.9 kg    Examination:   Constitutional: NAD, alert, oriented to self HEENT: conjunctivae and lids normal, EOMI CV: RRR no M,R,G. Distal pulses +2.  No cyanosis.   RESP: breath sounds more clear today, normal respiratory effort  GI: +BS, NTND Extremities: No effusions, edema, or tenderness in BLE SKIN: warm, dry and intact Neuro: II - XII grossly intact.     Data Reviewed: I have personally reviewed following labs and imaging studies  CBC: Recent Labs  Lab 05/07/19 1123 05/07/19 1123 05/07/19 1537 05/08/19 0302 05/09/19 0607 05/10/19 0454 05/11/19 0512  WBC 15.0*   < > 15.4* 13.8* 8.4 8.2 8.1  NEUTROABS 12.1*  --  12.0*  --   --   --   --   HGB 14.0   < >  10.9* 10.7* 9.3* 10.0* 9.9*  HCT 42.5   < > 32.9* 32.4* 28.5* 30.0* 30.3*  MCV 84.8   < > 85.0 85.0 85.8 84.7 85.4  PLT 966*   < > 797* 747* 681* 730* 735*   < > = values in this interval not displayed.   Basic Metabolic Panel: Recent Labs  Lab 05/07/19 1123 05/09/19 0607 05/10/19 0454 05/11/19 0512  NA 139 138 139 136  K 3.4* 2.8* 3.3* 3.4*  CL 95* 98 101 101  CO2 29 28 28 29   GLUCOSE 118* 86 95 97  BUN 16 16 13 11   CREATININE 1.09 0.99 0.95 0.83  CALCIUM 9.3 7.9* 8.1* 8.2*  MG  --  1.8 2.0 2.0   GFR: Estimated Creatinine Clearance: 87 mL/min (by C-G formula based on SCr of 0.83 mg/dL). Liver  Function Tests: Recent Labs  Lab 05/07/19 1123  AST 22  ALT 12  ALKPHOS 68  BILITOT 1.6*  PROT 9.0*  ALBUMIN 3.6   No results for input(s): LIPASE, AMYLASE in the last 168 hours. No results for input(s): AMMONIA in the last 168 hours. Coagulation Profile: Recent Labs  Lab 05/07/19 1123  INR 1.1   Cardiac Enzymes: No results for input(s): CKTOTAL, CKMB, CKMBINDEX, TROPONINI in the last 168 hours. BNP (last 3 results) No results for input(s): PROBNP in the last 8760 hours. HbA1C: No results for input(s): HGBA1C in the last 72 hours. CBG: No results for input(s): GLUCAP in the last 168 hours. Lipid Profile: No results for input(s): CHOL, HDL, LDLCALC, TRIG, CHOLHDL, LDLDIRECT in the last 72 hours. Thyroid Function Tests: No results for input(s): TSH, T4TOTAL, FREET4, T3FREE, THYROIDAB in the last 72 hours. Anemia Panel: No results for input(s): VITAMINB12, FOLATE, FERRITIN, TIBC, IRON, RETICCTPCT in the last 72 hours. Sepsis Labs: Recent Labs  Lab 05/07/19 1123 05/07/19 1341 05/08/19 0827  PROCALCITON  --   --  0.12  LATICACIDVEN 2.2* 2.3* 1.1    Recent Results (from the past 240 hour(s))  Culture, blood (Routine x 2)     Status: None   Collection Time: 05/07/19 11:23 AM   Specimen: BLOOD  Result Value Ref Range Status   Specimen Description BLOOD RIGHT ANTECUBITAL  Final   Special Requests   Final    BOTTLES DRAWN AEROBIC AND ANAEROBIC Blood Culture adequate volume   Culture   Final    NO GROWTH 5 DAYS Performed at [facility], [address], [place], [place] [address]    Report Status 05/12/2019 FINAL  Final  Culture, blood (Routine x 2)     Status: None   Collection Time: 05/07/19 11:23 AM   Specimen: BLOOD  Result Value Ref Range Status   Specimen Description BLOOD BLOOD RIGHT HAND  Final   Special Requests   Final    BOTTLES DRAWN AEROBIC AND ANAEROBIC Blood Culture adequate volume   Culture   Final    NO GROWTH 5 DAYS Performed at  [facility], [address] [place], [place] [address]    Report Status 05/12/2019 FINAL  Final  Respiratory Panel by RT PCR (Flu A&B, Covid) - Nasopharyngeal Swab     Status: None   Collection Time: 05/07/19  1:52 PM   Specimen: Nasopharyngeal Swab  Result Value Ref Range Status   SARS Coronavirus 2 by RT PCR NEGATIVE NEGATIVE Final    Comment: (NOTE) SARS-CoV-2 target nucleic acids are NOT DETECTED. The SARS-CoV-2 RNA is generally detectable in upper respiratoy specimens during the  acute phase of infection. The lowest concentration of SARS-CoV-2 viral copies this assay can detect is 131 copies/mL. A negative result does not preclude SARS-Cov-2 infection and should not be used as the sole basis for treatment or other patient management decisions. A negative result may occur with  improper specimen collection/handling, submission of specimen other than nasopharyngeal swab, presence of viral mutation(s) within the areas targeted by this assay, and inadequate number of viral copies (<131 copies/mL). A negative result must be combined with clinical observations, patient history, and epidemiological information. The expected result is Negative. Fact Sheet for Patients:  PinkCheek.be Fact Sheet for Healthcare Providers:  GravelBags.it This test is not yet ap proved or cleared by the [place] FDA and  has been authorized for detection and/or diagnosis of SARS-CoV-2 by FDA under an Emergency Use Authorization (EUA). This EUA will remain  in effect (meaning this test can be used) for the duration of the COVID-19 declaration under Section 564(b)(1) of the Act, 21 U.S.C. section 360bbb-3(b)(1), unless the authorization is terminated or revoked sooner.    Influenza A by PCR NEGATIVE NEGATIVE Final   Influenza B by PCR NEGATIVE NEGATIVE Final    Comment: (NOTE) The Xpert Xpress SARS-CoV-2/FLU/RSV assay is intended as  an aid in  the diagnosis of influenza from Nasopharyngeal swab specimens and  should not be used as a sole basis for treatment. Nasal washings and  aspirates are unacceptable for Xpert Xpress SARS-CoV-2/FLU/RSV  testing. Fact Sheet for Patients: PinkCheek.be Fact Sheet for Healthcare Providers: GravelBags.it This test is not yet approved or cleared by the [place] FDA and  has been authorized for detection and/or diagnosis of SARS-CoV-2 by  FDA under an Emergency Use Authorization (EUA). This EUA will remain  in effect (meaning this test can be used) for the duration of the  Covid-19 declaration under Section 564(b)(1) of the Act, 21  U.S.C. section 360bbb-3(b)(1), unless the authorization is  terminated or revoked. Performed at [facility], [address] [place], [place] [address]   MRSA PCR Screening     Status: None   Collection Time: 05/09/19 12:45 AM   Specimen: Nasopharyngeal  Result Value Ref Range Status   MRSA by PCR NEGATIVE NEGATIVE Final    Comment:        The GeneXpert MRSA Assay (FDA approved for NASAL specimens only), is one component of a comprehensive MRSA colonization surveillance program. It is not intended to diagnose MRSA infection nor to guide or monitor treatment for MRSA infections. Performed at [facility], [place], [place] [address]   SARS CORONAVIRUS 2 (TAT 6-24 HRS) Nasopharyngeal Nasopharyngeal Swab     Status: None   Collection Time: 05/11/19  2:19 PM   Specimen: Nasopharyngeal Swab  Result Value Ref Range Status   SARS Coronavirus 2 NEGATIVE NEGATIVE Final    Comment: (NOTE) SARS-CoV-2 target nucleic acids are NOT DETECTED. The SARS-CoV-2 RNA is generally detectable in upper and lower respiratory specimens during the acute phase of infection. Negative results do not preclude SARS-CoV-2 infection, do not rule out co-infections with other  pathogens, and should not be used as the sole basis for treatment or other patient management decisions. Negative results must be combined with clinical observations, patient history, and epidemiological information. The expected result is Negative. Fact Sheet for Patients: SugarRoll.be Fact Sheet for Healthcare Providers: https://www.woods-mathews.com/ This test is not yet approved or cleared by the [place] FDA and  has been authorized for detection and/or diagnosis  of SARS-CoV-2 by FDA under an Emergency Use Authorization (EUA). This EUA will remain  in effect (meaning this test can be used) for the duration of the COVID-19 declaration under Section 56 4(b)(1) of the Act, 21 U.S.C. section 360bbb-3(b)(1), unless the authorization is terminated or revoked sooner. Performed at [facility], [place] [address] [place], [place] [address]       Radiology Studies: No results found.   Scheduled Meds: . allopurinol  100 mg Oral Daily  . amLODipine  10 mg Oral Daily  . aspirin  81 mg Oral Daily  . atorvastatin  40 mg Oral Daily  . carvedilol  25 mg Oral BID WC  . colchicine  0.6 mg Oral Daily  . enoxaparin (LOVENOX) injection  40 mg Subcutaneous Q24H  . feeding supplement (ENSURE ENLIVE)  237 mL Oral BID BM  . potassium chloride  40 mEq Oral Daily   Continuous Infusions:    LOS: 4 days     Enzo Bi, MD [facility] If 7PM-7AM, please contact night-coverage 05/12/2019, 2:49 PM

## 2019-05-13 NOTE — Progress Notes (Signed)
PROGRESS NOTE    Marc Smith  [id number] DOB: 23-Nov-1945 DOA: 05/07/2019 PCP: Lavera Guise, MD    Assessment & Plan:   Principal Problem:   Acute pain of right lower extremity Active Problems:   Gout attack   Essential hypertension   Tachycardia   Aspiration pneumonia (HCC)   Elevated troponin I level   Thrombocytosis (HCC)   Stool incontinence   Urinary incontinence   Age-related physical debility   Hyperlipidemia    Marc Smith is a 74 y.o. AA male with medical history significant of HTN, gout, HLD who presented with RLE pain.   Aspiration Pneumonia, treated Sepsis ruled out CT notable for debris in R mainstem, however, procal not elevated, and pt not febrile or hypoxic.  But pt did have significant leukocytosis which improved after starting abx, so will treat for aspiration PNA with a course of abx. --SLP, noted no overt clinical s/s of aspiration, upgraded to Dysphagia level 3, Thin liquids VIA CUP.  --Received 2 days of Unasyn and transitioned to Augmentin for a 5-day course, completed.  Elevated Troponin from demand ischemia --Trop peaked at 769. --cardiology consulted, recommended d/c heparin and no further ischemic workup.   Sinus tachycardia, resolved --continue coreg 25 mg BID   Right Ankle Pain  Right Lower Extremity Swelling, likely due to gout, resolved pt suspected this may be gout,  --Ankle plain film wnl. --LE [place] (negative for DVT) --Continue allopurinol and colchicine  Colonic distention, resolved KUB showed mod stool in the rectum and colonic distention.  Pt noted to be incontinent of liquid stool on 3/17. --repeat KUB on 3/17 showed resolution of colonic distention.   Thrombocytosis --consulted heme on admission, suspect reactive  --follow up with Dr. Tasia Catchings 1 week after discharge  Hypertension:  continue amlodipine, coreg.   Hold lisinopril for now.  HLD: continue atorvastatin  Stool and urine incontinence   Not taking  zoloft per nephew   DVT prophylaxis: Lovenox SQ Code Status: Full code  Family Communication:  Disposition Plan: Medically ready for discharge, to SNF when bed available.   Subjective and Interval History:  Denied pain, dyspnea.  Feeding himself, liked the fruit cup.  No fever, N/V/D.   Objective: Vitals:   05/13/19 0206 05/13/19 0758 05/13/19 1209 05/13/19 1612  BP: (!) 141/85 138/79 (!) 102/56 128/69  Pulse: 74 85 65 67  Resp: 20 18 18 18   Temp: 98 F (36.7 C) 98.6 F (37 C) 97.8 F (36.6 C) 98.7 F (37.1 C)  TempSrc: Oral Oral    SpO2: 97% 97% 100% 100%  Weight:      Height:        Intake/Output Summary (Last 24 hours) at 05/13/2019 1617 Last data filed at 05/13/2019 1330 Gross per 24 hour  Intake 480 ml  Output 1500 ml  Net -1020 ml   Filed Weights   05/11/19 0500 05/12/19 0417 05/13/19 0123  Weight: 82.4 kg 78.9 kg 78 kg    Examination:   Constitutional: NAD, alert, oriented to self HEENT: conjunctivae and lids normal, EOMI CV: RRR no M,R,G. Distal pulses +2.  No cyanosis.   RESP: clear, normal respiratory effort  GI: +BS, NTND Extremities: No effusions, edema, or tenderness in BLE SKIN: warm, dry and intact Neuro: II - XII grossly intact.     Data Reviewed: I have personally reviewed following labs and imaging studies  CBC: Recent Labs  Lab 05/07/19 1123 05/07/19 1123 05/07/19 1537 05/08/19 0302 05/09/19 [id number] 05/10/19 0454 05/11/19  0512  WBC 15.0*   < > 15.4* 13.8* 8.4 8.2 8.1  NEUTROABS 12.1*  --  12.0*  --   --   --   --   HGB 14.0   < > 10.9* 10.7* 9.3* 10.0* 9.9*  HCT 42.5   < > 32.9* 32.4* 28.5* 30.0* 30.3*  MCV 84.8   < > 85.0 85.0 85.8 84.7 85.4  PLT 966*   < > 797* 747* 681* 730* 735*   < > = values in this interval not displayed.   Basic Metabolic Panel: Recent Labs  Lab 05/07/19 1123 05/09/19 0607 05/10/19 0454 05/11/19 0512  NA 139 138 139 136  K 3.4* 2.8* 3.3* 3.4*  CL 95* 98 101 101  CO2 29 28 28 29   GLUCOSE 118* 86  95 97  BUN 16 16 13 11   CREATININE 1.09 0.99 0.95 0.83  CALCIUM 9.3 7.9* 8.1* 8.2*  MG  --  1.8 2.0 2.0   GFR: Estimated Creatinine Clearance: 87 mL/min (by C-G formula based on SCr of 0.83 mg/dL). Liver Function Tests: Recent Labs  Lab 05/07/19 1123  AST 22  ALT 12  ALKPHOS 68  BILITOT 1.6*  PROT 9.0*  ALBUMIN 3.6   No results for input(s): LIPASE, AMYLASE in the last 168 hours. No results for input(s): AMMONIA in the last 168 hours. Coagulation Profile: Recent Labs  Lab 05/07/19 1123  INR 1.1   Cardiac Enzymes: No results for input(s): CKTOTAL, CKMB, CKMBINDEX, TROPONINI in the last 168 hours. BNP (last 3 results) No results for input(s): PROBNP in the last 8760 hours. HbA1C: No results for input(s): HGBA1C in the last 72 hours. CBG: No results for input(s): GLUCAP in the last 168 hours. Lipid Profile: No results for input(s): CHOL, HDL, LDLCALC, TRIG, CHOLHDL, LDLDIRECT in the last 72 hours. Thyroid Function Tests: No results for input(s): TSH, T4TOTAL, FREET4, T3FREE, THYROIDAB in the last 72 hours. Anemia Panel: No results for input(s): VITAMINB12, FOLATE, FERRITIN, TIBC, IRON, RETICCTPCT in the last 72 hours. Sepsis Labs: Recent Labs  Lab 05/07/19 1123 05/07/19 1341 05/08/19 0827  PROCALCITON  --   --  0.12  LATICACIDVEN 2.2* 2.3* 1.1    Recent Results (from the past 240 hour(s))  Culture, blood (Routine x 2)     Status: None   Collection Time: 05/07/19 11:23 AM   Specimen: BLOOD  Result Value Ref Range Status   Specimen Description BLOOD RIGHT ANTECUBITAL  Final   Special Requests   Final    BOTTLES DRAWN AEROBIC AND ANAEROBIC Blood Culture adequate volume   Culture   Final    NO GROWTH 5 DAYS Performed at [facility], [address] [place], [place] [address]    Report Status 05/12/2019 FINAL  Final  Culture, blood (Routine x 2)     Status: None   Collection Time: 05/07/19 11:23 AM   Specimen: BLOOD  Result Value Ref Range Status    Specimen Description BLOOD BLOOD RIGHT HAND  Final   Special Requests   Final    BOTTLES DRAWN AEROBIC AND ANAEROBIC Blood Culture adequate volume   Culture   Final    NO GROWTH 5 DAYS Performed at [facility], [address], [place], [place] [address]    Report Status 05/12/2019 FINAL  Final  Respiratory Panel by RT PCR (Flu A&B, Covid) - Nasopharyngeal Swab     Status: None   Collection Time: 05/07/19  1:52 PM   Specimen: Nasopharyngeal Swab  Result  Value Ref Range Status   SARS Coronavirus 2 by RT PCR NEGATIVE NEGATIVE Final    Comment: (NOTE) SARS-CoV-2 target nucleic acids are NOT DETECTED. The SARS-CoV-2 RNA is generally detectable in upper respiratoy specimens during the acute phase of infection. The lowest concentration of SARS-CoV-2 viral copies this assay can detect is 131 copies/mL. A negative result does not preclude SARS-Cov-2 infection and should not be used as the sole basis for treatment or other patient management decisions. A negative result may occur with  improper specimen collection/handling, submission of specimen other than nasopharyngeal swab, presence of viral mutation(s) within the areas targeted by this assay, and inadequate number of viral copies (<131 copies/mL). A negative result must be combined with clinical observations, patient history, and epidemiological information. The expected result is Negative. Fact Sheet for Patients:  PinkCheek.be Fact Sheet for Healthcare Providers:  GravelBags.it This test is not yet ap proved or cleared by the [place] FDA and  has been authorized for detection and/or diagnosis of SARS-CoV-2 by FDA under an Emergency Use Authorization (EUA). This EUA will remain  in effect (meaning this test can be used) for the duration of the COVID-19 declaration under Section 564(b)(1) of the Act, 21 U.S.C. section 360bbb-3(b)(1), unless the authorization  is terminated or revoked sooner.    Influenza A by PCR NEGATIVE NEGATIVE Final   Influenza B by PCR NEGATIVE NEGATIVE Final    Comment: (NOTE) The Xpert Xpress SARS-CoV-2/FLU/RSV assay is intended as an aid in  the diagnosis of influenza from Nasopharyngeal swab specimens and  should not be used as a sole basis for treatment. Nasal washings and  aspirates are unacceptable for Xpert Xpress SARS-CoV-2/FLU/RSV  testing. Fact Sheet for Patients: PinkCheek.be Fact Sheet for Healthcare Providers: GravelBags.it This test is not yet approved or cleared by the [place] FDA and  has been authorized for detection and/or diagnosis of SARS-CoV-2 by  FDA under an Emergency Use Authorization (EUA). This EUA will remain  in effect (meaning this test can be used) for the duration of the  Covid-19 declaration under Section 564(b)(1) of the Act, 21  U.S.C. section 360bbb-3(b)(1), unless the authorization is  terminated or revoked. Performed at [facility], [address] [place], [place] [address]   MRSA PCR Screening     Status: None   Collection Time: 05/09/19 12:45 AM   Specimen: Nasopharyngeal  Result Value Ref Range Status   MRSA by PCR NEGATIVE NEGATIVE Final    Comment:        The GeneXpert MRSA Assay (FDA approved for NASAL specimens only), is one component of a comprehensive MRSA colonization surveillance program. It is not intended to diagnose MRSA infection nor to guide or monitor treatment for MRSA infections. Performed at [facility], [place], [place] [address]   SARS CORONAVIRUS 2 (TAT 6-24 HRS) Nasopharyngeal Nasopharyngeal Swab     Status: None   Collection Time: 05/11/19  2:19 PM   Specimen: Nasopharyngeal Swab  Result Value Ref Range Status   SARS Coronavirus 2 NEGATIVE NEGATIVE Final    Comment: (NOTE) SARS-CoV-2 target nucleic acids are NOT DETECTED. The SARS-CoV-2  RNA is generally detectable in upper and lower respiratory specimens during the acute phase of infection. Negative results do not preclude SARS-CoV-2 infection, do not rule out co-infections with other pathogens, and should not be used as the sole basis for treatment or other patient management decisions. Negative results must be combined with clinical observations, patient history, and epidemiological  information. The expected result is Negative. Fact Sheet for Patients: SugarRoll.be Fact Sheet for Healthcare Providers: https://www.woods-mathews.com/ This test is not yet approved or cleared by the [place] FDA and  has been authorized for detection and/or diagnosis of SARS-CoV-2 by FDA under an Emergency Use Authorization (EUA). This EUA will remain  in effect (meaning this test can be used) for the duration of the COVID-19 declaration under Section 56 4(b)(1) of the Act, 21 U.S.C. section 360bbb-3(b)(1), unless the authorization is terminated or revoked sooner. Performed at [facility], [place] [address], [place], [place] [address]       Radiology Studies: No results found.   Scheduled Meds: . allopurinol  100 mg Oral Daily  . amLODipine  10 mg Oral Daily  . aspirin  81 mg Oral Daily  . atorvastatin  40 mg Oral Daily  . carvedilol  25 mg Oral BID WC  . colchicine  0.6 mg Oral Daily  . enoxaparin (LOVENOX) injection  40 mg Subcutaneous Q24H  . feeding supplement (ENSURE ENLIVE)  237 mL Oral BID BM  . potassium chloride  40 mEq Oral Daily   Continuous Infusions:    LOS: 5 days     Enzo Bi, MD [facility] If 7PM-7AM, please contact night-coverage 05/13/2019, 4:17 PM

## 2019-05-13 NOTE — Plan of Care (Addendum)
VSS. Falls precautions maintained. Alert and oriented to self. Intermittent disorientation to time, situation and place. Pt reoriented easily. Assessment as documented. Denies any pain. Pt moves well in the bed. External catheter in place, urine output adequate. Dc pending SNF placement. POC reviewed, no new needs/concerns at this time.   Problem: Education: Goal: Knowledge of [place] [facility] Education information/materials will improve Outcome: Progressing Goal: Emotional status will improve Outcome: Progressing Goal: Mental status will improve Outcome: Progressing Goal: Verbalization of understanding the information provided will improve Outcome: Progressing   Problem: Activity: Goal: Interest or engagement in activities will improve Outcome: Progressing Goal: Sleeping patterns will improve Outcome: Progressing   Problem: Coping: Goal: Ability to verbalize frustrations and anger appropriately will improve Outcome: Progressing Goal: Ability to demonstrate self-control will improve Outcome: Progressing   Problem: Health Behavior/Discharge Planning: Goal: Identification of resources available to assist in meeting health care needs will improve Outcome: Progressing Goal: Compliance with treatment plan for underlying cause of condition will improve Outcome: Progressing   Problem: Physical Regulation: Goal: Ability to maintain clinical measurements within normal limits will improve Outcome: Progressing   Problem: Safety: Goal: Periods of time without injury will increase Outcome: Progressing   Problem: Education: Goal: Knowledge of General Education information will improve Description: Including pain rating scale, medication(s)/side effects and non-pharmacologic comfort measures Outcome: Progressing   Problem: Health Behavior/Discharge Planning: Goal: Ability to manage health-related needs will improve Outcome: Progressing   Problem: Clinical Measurements: Goal:  Ability to maintain clinical measurements within normal limits will improve Outcome: Progressing Goal: Will remain free from infection Outcome: Progressing Goal: Diagnostic test results will improve Outcome: Progressing Goal: Respiratory complications will improve Outcome: Progressing Goal: Cardiovascular complication will be avoided Outcome: Progressing   Problem: Activity: Goal: Risk for activity intolerance will decrease Outcome: Progressing   Problem: Nutrition: Goal: Adequate nutrition will be maintained Outcome: Progressing   Problem: Coping: Goal: Level of anxiety will decrease Outcome: Progressing   Problem: Elimination: Goal: Will not experience complications related to bowel motility Outcome: Progressing Goal: Will not experience complications related to urinary retention Outcome: Progressing   Problem: Pain Managment: Goal: General experience of comfort will improve Outcome: Progressing   Problem: Safety: Goal: Ability to remain free from injury will improve Outcome: Progressing   Problem: Skin Integrity: Goal: Risk for impaired skin integrity will decrease Outcome: Progressing   Problem: Education: Goal: Knowledge of General Education information will improve Description: Including pain rating scale, medication(s)/side effects and non-pharmacologic comfort measures Outcome: Progressing   Problem: Health Behavior/Discharge Planning: Goal: Ability to manage health-related needs will improve Outcome: Progressing   Problem: Clinical Measurements: Goal: Ability to maintain clinical measurements within normal limits will improve Outcome: Progressing Goal: Will remain free from infection Outcome: Progressing Goal: Diagnostic test results will improve Outcome: Progressing Goal: Respiratory complications will improve Outcome: Progressing Goal: Cardiovascular complication will be avoided Outcome: Progressing   Problem: Activity: Goal: Risk for activity  intolerance will decrease Outcome: Progressing   Problem: Nutrition: Goal: Adequate nutrition will be maintained Outcome: Progressing   Problem: Coping: Goal: Level of anxiety will decrease Outcome: Progressing   Problem: Elimination: Goal: Will not experience complications related to bowel motility Outcome: Progressing Goal: Will not experience complications related to urinary retention Outcome: Progressing   Problem: Pain Managment: Goal: General experience of comfort will improve Outcome: Progressing   Problem: Safety: Goal: Ability to remain free from injury will improve Outcome: Progressing   Problem: Skin Integrity: Goal: Risk for impaired skin integrity will decrease Outcome:  Progressing

## 2019-05-13 NOTE — Care Management Important Message (Signed)
Important Message  Patient Details  Name: Marc Smith MRN: [id number] Date of Birth: 08/18/1945   Medicare Important Message Given:  Yes     Dannette Barbara 05/13/2019, 11:58 AM

## 2019-05-13 NOTE — TOC Progression Note (Signed)
Transition of Care [facility]) - Progression Note    Patient Details  Name: Marc Smith MRN: [id number] Date of Birth: [birth year]  Transition of Care [facility]) CM/SW [place], LCSW Phone Number: 05/13/2019, 3:08 PM  Clinical Narrative:  Updated therapy note faxed to [place].     Expected Discharge Plan: [place] Barriers to Discharge: Continued Medical Work up  Expected Discharge Plan and Services Expected Discharge Plan: [place] In-house Referral: Clinical Social Work   Post Acute Care Choice: [place] Living arrangements for the past 2 months: Single Family Home                                       Social Determinants of Health (SDOH) Interventions    Readmission Risk Interventions No flowsheet data found.

## 2019-05-13 NOTE — TOC Progression Note (Signed)
Transition of Care [facility]) - Progression Note    Patient Details  Name: Marc Smith MRN: [id number] Date of Birth: 25-Apr-1945  Transition of Care [facility]) CM/SW Contact  Eileen Stanford, LCSW Phone Number: 05/13/2019, 10:47 AM  Clinical Narrative:  [facility] called to request additional clinicals faxed to confirmed fax number.      Expected Discharge Plan: [place] Barriers to Discharge: Continued Medical Work up  Expected Discharge Plan and Services Expected Discharge Plan: [place] In-house Referral: Clinical Social Work   Post Acute Care Choice: [place] Living arrangements for the past 2 months: Single Family Home                                       Social Determinants of Health (SDOH) Interventions    Readmission Risk Interventions No flowsheet data found.

## 2019-05-13 NOTE — Progress Notes (Signed)
Physical Therapy Treatment Patient Details Name: Marc Smith MRN: [id number] DOB: August 24, 1945 Today's Date: 05/13/2019    History of Present Illness per MD: Marc Smith is a 74 y.o. male with medical history significant of HTN, gout, HLD presentine with RLE pain.    PT Comments    Pt stated R ankle is feeling better.  Denies pain.  Motivated to attempt session.  Bed mobility with min guard for safety.  Generally steady in sitting but will lean on right elbow due to fatigue.  Stands x 2 at bedside with mod a x 1.  He remains with flexed posture and attempts at stepping cause buckling and backwards LOB requiring increased support.  He is unable to walk to attempt transfer with +1 assist.  He self initiates return to supine due to fatigue.  SNF remains appropriate for discharge.   Follow Up Recommendations  SNF     Equipment Recommendations  Rolling walker with 5" wheels    Recommendations for Other Services       Precautions / Restrictions Precautions Precautions: Fall    Mobility  Bed Mobility Overal bed mobility: Needs Assistance Bed Mobility: Supine to Sit;Sit to Supine     Supine to sit: Min guard Sit to supine: Min guard   General bed mobility comments: needs VC for safety  Transfers Overall transfer level: Needs assistance Equipment used: Rolling walker (2 wheeled) Transfers: Sit to/from Stand Sit to Stand: Mod assist         General transfer comment: VC for safety  Ambulation/Gait             General Gait Details: unable to step safely with +1 assist, knees buckle   Stairs             Wheelchair Mobility    Modified Rankin (Stroke Patients Only)       Balance Overall balance assessment: Needs assistance Sitting-balance support: Feet supported Sitting balance-Leahy Scale: Fair Sitting balance - Comments: leans right due to fatigue but no LOB   Standing balance support: Bilateral upper extremity supported Standing balance-Leahy  Scale: Poor Standing balance comment: buckles with attempt at stepping                            Cognition Arousal/Alertness: Awake/alert Behavior During Therapy: WFL for tasks assessed/performed Overall Cognitive Status: Within Functional Limits for tasks assessed                                        Exercises      General Comments        Pertinent Vitals/Pain Pain Assessment: No/denies pain    Home Living                      Prior Function            PT Goals (current goals can now be found in the care plan section) Progress towards PT goals: Progressing toward goals    Frequency    Min 2X/week      PT Plan Current plan remains appropriate    Co-evaluation              AM-PAC PT "6 Clicks" Mobility   Outcome Measure  Help needed turning from your back to your side while in a flat bed without using bedrails?: A Little  Help needed moving from lying on your back to sitting on the side of a flat bed without using bedrails?: A Little Help needed moving to and from a bed to a chair (including a wheelchair)?: A Lot Help needed standing up from a chair using your arms (e.g., wheelchair or bedside chair)?: A Lot Help needed to walk in hospital room?: Total Help needed climbing 3-5 steps with a railing? : Total 6 Click Score: 12    End of Session Equipment Utilized During Treatment: Gait belt Activity Tolerance: Patient tolerated treatment well;Patient limited by fatigue Patient left: in bed;with call bell/phone within reach;with bed alarm set Nurse Communication: Mobility status;Other (comment)       Time: [id number] PT Time Calculation (min) (ACUTE ONLY): 13 min  Charges:  $Therapeutic Activity: 8-22 mins                    Chesley Noon, PTA 05/13/19, 1:50 PM

## 2019-05-13 NOTE — TOC Progression Note (Signed)
Transition of Care [facility]) - Progression Note    Patient Details  Name: Marc Smith MRN: [id number] Date of Birth: 11-Aug-1945  Transition of Care [facility]) CM/SW Contact  Eileen Stanford, LCSW Phone Number: 05/13/2019, 1:13 PM  Clinical Narrative:   [facility] called requesting a updated PT note, order placed for imminent d/c.    Expected Discharge Plan: [place] Barriers to Discharge: Continued Medical Work up  Expected Discharge Plan and Services Expected Discharge Plan: [place] In-house Referral: Clinical Social Work   Post Acute Care Choice: [place] Living arrangements for the past 2 months: Single Family Home                                       Social Determinants of Health (SDOH) Interventions    Readmission Risk Interventions No flowsheet data found.

## 2019-05-14 LAB — CBC
HCT: 35 % — ABNORMAL LOW (ref 39.0–52.0)
Hemoglobin: 11.1 g/dL — ABNORMAL LOW (ref 13.0–17.0)
MCH: 27.3 pg (ref 26.0–34.0)
MCHC: 31.7 g/dL (ref 30.0–36.0)
MCV: 86.2 fL (ref 80.0–100.0)
Platelets: 817 10*3/uL — ABNORMAL HIGH (ref 150–400)
RBC: 4.06 MIL/uL — ABNORMAL LOW (ref 4.22–5.81)
RDW: 14.2 % (ref 11.5–15.5)
WBC: 9.7 10*3/uL (ref 4.0–10.5)
nRBC: 0 % (ref 0.0–0.2)

## 2019-05-14 LAB — BASIC METABOLIC PANEL
Anion gap: 9 (ref 5–15)
BUN: 16 mg/dL (ref 8–23)
CO2: 26 mmol/L (ref 22–32)
Calcium: 8.4 mg/dL — ABNORMAL LOW (ref 8.9–10.3)
Chloride: 101 mmol/L (ref 98–111)
Creatinine, Ser: 1 mg/dL (ref 0.61–1.24)
GFR calc Af Amer: >60 mL/min (ref >60)
GFR calc non Af Amer: >60 mL/min (ref >60)
Glucose, Bld: 118 mg/dL — ABNORMAL HIGH (ref 70–99)
Potassium: 4.5 mmol/L (ref 3.5–5.1)
Sodium: 136 mmol/L (ref 135–145)

## 2019-05-14 LAB — MAGNESIUM: Magnesium: 2.3 mg/dL (ref 1.7–2.4)

## 2019-05-14 MED ORDER — ENSURE ENLIVE PO LIQD: 237.0000 mL | Freq: Two times a day (BID) | ORAL | 12 refills | Status: DC

## 2019-05-14 MED ORDER — LISINOPRIL 40 MG PO TABS: ORAL_TABLET | ORAL | Status: DC

## 2019-05-14 MED ORDER — ASPIRIN 81 MG PO CHEW: 81.0000 mg | CHEWABLE_TABLET | Freq: Every day | ORAL | Status: DC

## 2019-05-14 NOTE — Discharge Summary (Signed)
Physician Discharge Summary   Marc Smith  male DOB: Jan 13, 1946  [id number]  PCP: No primary care provider on file.  Admit date: 05/07/2019 Discharge date: 05/14/2019  Admitted From: home Disposition:  SNF CODE STATUS: Full code   Hospital Course:  For full details, please see H&P, progress notes, consult notes and ancillary notes.  Briefly,  Marc Windom Rogersis a 74 y.o.AA malewith medical history significant ofHTN, gout, HLDwho presentedwith RLEpain.   Aspiration Pneumonia, treated Sepsis ruled out CT notable for debris in R mainstem, however, procal not elevated, and pt not febrile or hypoxic.  But pt did have significant leukocytosis which improved after starting abx, so pt was treated for aspiration PNA with a 5-day course of abx, with 2 days of Unasyn followed by augmentin.  SLP noted no overt clinical s/s of aspiration, so upgraded to Dysphagia level 3, Thin liquids VIA CUP.   Elevated Troponin from demand ischemia Trop peaked at 769.  Cardiology consulted, recommended d/c heparin and no further ischemic workup.   Sinus tachycardia, resolved Continued coreg 25 mg BID  Right Ankle Pain  Right Lower Extremity Swelling, likely due to gout, resolved Pt suspected this may be gout.  Ankle plain film wnl.  LE [place] negative for DVT.  Pt was started on allopurinol and colchicine with resolution of symptoms.  On discharge, pt was continued on allopurinol.    Colonic distention, resolved On presentation, KUB showed mod stool in the rectum and colonic distention.  Pt noted to be incontinent of liquid stool on 3/17, but repeat KUB on 3/17 showed resolution of colonic distention.  Pt has had formed stools since then.  Thrombocytosis Plt 966 on presentation, and 817 prior to discharge.  Consulted heme on admission, suspect reactive.  Pt will follow up with Dr. Tasia Catchings 1 week after discharge  Hypertension Continued amlodipine, coreg. Home Lisinopril was held during  hospitalization due to low normal BP, and will continue to be held pending outpatient PCP followup.  HLD Continued atorvastatin  Stool and urine incontinence, POA Per family, this has been a chronic problem at home, and pt is not aware when he has soiled himself.     Discharge Diagnoses:  Principal Problem:   Acute pain of right lower extremity Active Problems:   Gout attack   Essential hypertension   Tachycardia   Aspiration pneumonia (HCC)   Elevated troponin I level   Thrombocytosis (HCC)   Stool incontinence   Urinary incontinence   Age-related physical debility   Hyperlipidemia    Discharge Instructions:  Allergies as of 05/14/2019   No Known Allergies     Medication List    STOP taking these medications   colchicine 0.6 MG tablet     TAKE these medications   allopurinol 100 MG tablet Commonly known as: ZYLOPRIM Take 100 mg by mouth daily.   amLODipine 10 MG tablet Commonly known as: NORVASC Take 10 mg by mouth daily.   aspirin 81 MG chewable tablet Chew 1 tablet (81 mg total) by mouth daily. Start taking on: May 15, 2019   atorvastatin 40 MG tablet Commonly known as: LIPITOR Take 40 mg by mouth daily.   carvedilol 25 MG tablet Commonly known as: COREG Take 25 mg by mouth 2 (two) times daily with a meal.   feeding supplement (ENSURE ENLIVE) Liqd Take 237 mLs by mouth 2 (two) times daily between meals.   lisinopril 40 MG tablet Commonly known as: ZESTRIL Hold until outpatient followup due to low  normal BP in the hospital. What changed:   how much to take  how to take this  when to take this  additional instructions   sertraline 50 MG tablet Commonly known as: ZOLOFT Take 50 mg by mouth daily.       Follow-up Information    Marc Guise, MD. Schedule an appointment as soon as possible for a visit on 05/21/2019.   Specialty: Internal Medicine Contact information: [place] [address] [phone]            No Known Allergies   The results of significant diagnostics from this hospitalization (including imaging, microbiology, ancillary and laboratory) are listed below for reference.   Consultations:   Procedures/Studies: DG Chest 2 View  Result Date: 05/07/2019 CLINICAL DATA:  Tachycardia EXAM: CHEST - 2 VIEW COMPARISON:  November 20, 2016. FINDINGS: There is ill-defined opacity in the lung bases without consolidation. Heart size and pulmonary vascularity are normal. No adenopathy. There is elevation of the left hemidiaphragm. Visualized bowel appears mildly dilated without appreciable air-fluid levels. No free air. There are old healed rib fractures on the right. IMPRESSION: 1. Ill-defined opacity in each lung base, concerning for atypical organism pneumonia. No consolidation. Lungs elsewhere clear. 2. Elevation of left hemidiaphragm is stable. Visualized bowel appear somewhat dilated. Question a degree of underlying ileus. 3.  Heart size normal. 4.  No evident adenopathy. Electronically Signed   By: Lowella Grip III M.D.   On: 05/07/2019 12:06   DG Ankle Complete Right  Result Date: 05/07/2019 CLINICAL DATA:  Foot pain EXAM: RIGHT ANKLE - COMPLETE 3+ VIEW COMPARISON:  None. FINDINGS: Alignment is anatomic. No acute fracture. Joint spaces are preserved. Vascular calcification is noted. Metallic fragments overlie the distal fibula. IMPRESSION: No acute osseous abnormality. Electronically Signed   By: Macy Mis M.D.   On: 05/07/2019 13:27   DG Abd 1 View  Result Date: 05/08/2019 CLINICAL DATA:  Impacted stool EXAM: ABDOMEN - 1 VIEW COMPARISON:  05/07/2019 FINDINGS: Mild diffuse increased bowel gas throughout. No definitive obstructive pattern. Mild stool at the rectum. Vascular calcifications IMPRESSION: Overall nonobstructed gas pattern. Small amount of retained stool at the rectum Electronically Signed   By: Donavan Foil M.D.   On: 05/08/2019 19:55   DG Abd 1 View  Result Date:  05/07/2019 CLINICAL DATA:  Bowel obstruction. EXAM: ABDOMEN - 1 VIEW COMPARISON:  None. FINDINGS: There are multiple old healed right-sided rib fractures. There is gaseous distention of loops of colon scattered throughout the abdomen. There is a moderate amount of stool at the level of the rectum. There is no pneumatosis. No definite free air. No evidence for a small bowel obstruction. There are degenerative changes throughout the visualized lumbar spine. 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 IMPRESSION: 1. Gaseous distention of the colon which may represent a colonic ileus. 2. No evidence for a small bowel obstruction. 3. Moderate amount of stool at the level of the rectum. Electronically Signed   By: Constance Holster M.D.   On: 05/07/2019 21:52   CT Angio Chest PE W and/or Wo Contrast  Result Date: 05/07/2019 CLINICAL DATA:  Pneumonia. EXAM: CT ANGIOGRAPHY CHEST WITH CONTRAST TECHNIQUE: Multidetector CT imaging of the chest was performed using the standard protocol during bolus administration of intravenous contrast. Multiplanar CT image reconstructions and MIPs were obtained to evaluate the vascular anatomy. CONTRAST:  18mL OMNIPAQUE IOHEXOL 350 MG/ML SOLN COMPARISON:  None. FINDINGS: Cardiovascular: Contrast injection is sufficient to demonstrate satisfactory opacification of  the pulmonary arteries to the segmental level. There is no pulmonary embolus. The main pulmonary artery is within normal limits for size. There is no CT evidence of acute right heart strain. There are atherosclerotic changes of the visualized aorta without evidence for an aneurysm or dissection. Heart size is borderline enlarged. Coronary artery calcifications are noted. Mediastinum/Nodes: --No mediastinal or hilar lymphadenopathy. --No axillary lymphadenopathy. --No supraclavicular lymphadenopathy. --there is a small left-sided thyroid nodule, for which no further follow-up is required. --The esophagus is  unremarkable Lungs/Pleura: There is a moderate amount of debris within the right mainstem bronchus. There is no pneumothorax. No large pleural effusion. No focal infiltrate. There is elevation of the left hemidiaphragm with atelectasis at the left lung base. Upper Abdomen: There are probable nonobstructing stones in the upper pole the left kidney. There is a cyst arising from the upper pole of the right kidney. Musculoskeletal: No chest wall abnormality. No acute or significant osseous findings. There are old healed right-sided rib fractures. Review of the MIP images confirms the above findings. IMPRESSION: 1. No CT evidence for acute pulmonary embolus. 2. Moderate amount of debris within the right mainstem bronchus, which may indicate aspiration. 3. Elevation of the left hemidiaphragm with atelectasis at the left lung base. No focal infiltrate. 4. Aortic Atherosclerosis (ICD10-I70.0). Electronically Signed   By: Constance Holster M.D.   On: 05/07/2019 16:10   US Venous Img Lower Bilateral (DVT)  Result Date: 05/07/2019 CLINICAL DATA:  Edema EXAM: BILATERAL LOWER EXTREMITY VENOUS DOPPLER ULTRASOUND TECHNIQUE: Gray-scale sonography with compression, as well as color and duplex ultrasound, were performed to evaluate the deep venous system(s) from the level of the common femoral vein through the popliteal and proximal calf veins. COMPARISON:  02/08/2012 FINDINGS: VENOUS Normal compressibility of the common femoral, superficial femoral, and popliteal veins, as well as the visualized calf veins. Visualized portions of profunda femoral vein and great saphenous vein unremarkable. No filling defects to suggest DVT on grayscale or color Doppler imaging. Doppler waveforms show normal direction of venous flow, normal respiratory phasicity and response to augmentation. Calf veins are poorly visualized bilaterally. OTHER None. Limitations: none IMPRESSION: No femoropopliteal DVT nor evidence of DVT within the visualized  calf veins. If clinical symptoms are inconsistent or if there are persistent or worsening symptoms, further imaging (possibly involving the iliac veins) may be warranted. Electronically Signed   By: Lavonia Dana M.D.   On: 05/07/2019 18:01   ECHOCARDIOGRAM COMPLETE  Result Date: 05/08/2019    ECHOCARDIOGRAM REPORT   Patient Name:   Marc Smith Date of Exam: 05/08/2019 Medical Rec #:  [id number]       Height:       72.0 in Accession #:    [id number]      Weight:       183.6 lb Date of Birth:  1945-08-23        BSA:          2.054 m Patient Age:    76 years        BP:           178/85 mmHg Patient Gender: M               HR:           101 bpm. Exam Location:  [facility] Procedure: 2D Echo, Cardiac Doppler and Color Doppler Indications:     Elevated troponin  History:         Patient has no prior history of Echocardiogram  examinations.                  Risk Factors:Hypertension.  Sonographer:     Sherrie Sport RDCS (AE) Referring Phys:  [id number] Alben Deeds JR Diagnosing Phys: Bartholome Bill MD  Sonographer Comments: Technically difficult study due to poor echo windows. IMPRESSIONS  1. Dextrocardia. Left ventricular ejection fraction, by estimation, is 50 to 55%. The left ventricle has low normal function. The left ventricle has no regional wall motion abnormalities. Left ventricular diastolic parameters were normal.  2. Right ventricular systolic function is normal. The right ventricular size is normal.  3. The mitral valve was not well visualized. Trivial mitral valve regurgitation.  4. The aortic valve was not well visualized. Aortic valve regurgitation is not visualized. FINDINGS  Left Ventricle: Dextrocardia. Left ventricular ejection fraction, by estimation, is 50 to 55%. The left ventricle has low normal function. The left ventricle has no regional wall motion abnormalities. The left ventricular internal cavity size was normal  in size. There is borderline left ventricular hypertrophy. Left ventricular diastolic  parameters were normal. Right Ventricle: The right ventricular size is normal. No increase in right ventricular wall thickness. Right ventricular systolic function is normal. Left Atrium: Left atrial size was normal in size. Right Atrium: Right atrial size was normal in size. Pericardium: There is no evidence of pericardial effusion. Mitral Valve: The mitral valve was not well visualized. Trivial mitral valve regurgitation. Tricuspid Valve: The tricuspid valve is not well visualized. Tricuspid valve regurgitation is mild. Aortic Valve: The aortic valve was not well visualized. Aortic valve regurgitation is not visualized. Pulmonic Valve: The pulmonic valve was not well visualized. Pulmonic valve regurgitation is not visualized. Aorta: The aortic root was not well visualized. IAS/Shunts: The interatrial septum was not assessed.  LEFT VENTRICLE PLAX 2D LVIDd:         4.21 cm LVIDs:         3.29 cm LV PW:         1.34 cm LV IVS:        1.39 cm LVOT diam:     2.20 cm LVOT Area:     3.80 cm  LEFT ATRIUM         Index LA diam:    2.90 cm 1.41 cm/m                        PULMONIC VALVE AORTA                 PV Vmax:        0.90 m/s Ao Root diam: 3.00 cm PV Peak grad:   3.2 mmHg                       RVOT Peak grad: 4 mmHg   SHUNTS Systemic Diam: 2.20 cm Bartholome Bill MD Electronically signed by Bartholome Bill MD Signature Date/Time: 05/08/2019/12:23:08 PM    Final       Labs: BNP (last 3 results) No results for input(s): BNP in the last 8760 hours. Basic Metabolic Panel: Recent Labs  Lab 05/09/19 0607 05/10/19 0454 05/11/19 0512 05/14/19 0952  NA 138 139 136 136  K 2.8* 3.3* 3.4* 4.5  CL 98 101 101 101  CO2 28 28 29 26   GLUCOSE 86 95 97 118*  BUN 16 13 11 16   CREATININE 0.99 0.95 0.83 1.00  CALCIUM 7.9* 8.1* 8.2* 8.4*  MG 1.8 2.0 2.0 2.3  Liver Function Tests: No results for input(s): AST, ALT, ALKPHOS, BILITOT, PROT, ALBUMIN in the last 168 hours. No results for input(s): LIPASE, AMYLASE in the  last 168 hours. No results for input(s): AMMONIA in the last 168 hours. CBC: Recent Labs  Lab 05/07/19 1537 05/07/19 1537 05/08/19 0302 05/09/19 0607 05/10/19 0454 05/11/19 0512 05/14/19 0952  WBC 15.4*   < > 13.8* 8.4 8.2 8.1 9.7  NEUTROABS 12.0*  --   --   --   --   --   --   HGB 10.9*   < > 10.7* 9.3* 10.0* 9.9* 11.1*  HCT 32.9*   < > 32.4* 28.5* 30.0* 30.3* 35.0*  MCV 85.0   < > 85.0 85.8 84.7 85.4 86.2  PLT 797*   < > 747* 681* 730* 735* 817*   < > = values in this interval not displayed.   Cardiac Enzymes: No results for input(s): CKTOTAL, CKMB, CKMBINDEX, TROPONINI in the last 168 hours. BNP: Invalid input(s): POCBNP CBG: No results for input(s): GLUCAP in the last 168 hours. D-Dimer No results for input(s): DDIMER in the last 72 hours. Hgb A1c No results for input(s): HGBA1C in the last 72 hours. Lipid Profile No results for input(s): CHOL, HDL, LDLCALC, TRIG, CHOLHDL, LDLDIRECT in the last 72 hours. Thyroid function studies No results for input(s): TSH, T4TOTAL, T3FREE, THYROIDAB in the last 72 hours.  Invalid input(s): FREET3 Anemia work up No results for input(s): VITAMINB12, FOLATE, FERRITIN, TIBC, IRON, RETICCTPCT in the last 72 hours. Urinalysis    Component Value Date/Time   COLORURINE AMBER (A) 11/20/2016 1835   APPEARANCEUR HAZY (A) 11/20/2016 1835   APPEARANCEUR Clear 02/09/2012 1227   LABSPEC 1.017 11/20/2016 1835   LABSPEC 1.020 02/09/2012 1227   PHURINE 5.0 11/20/2016 1835   GLUCOSEU NEGATIVE 11/20/2016 1835   GLUCOSEU Negative 02/09/2012 1227   HGBUR MODERATE (A) 11/20/2016 1835   BILIRUBINUR NEGATIVE 11/20/2016 1835   BILIRUBINUR Negative 02/09/2012 [place] 11/20/2016 1835   PROTEINUR 100 (A) 11/20/2016 1835   NITRITE NEGATIVE 11/20/2016 1835   LEUKOCYTESUR TRACE (A) 11/20/2016 1835   LEUKOCYTESUR Negative 02/09/2012 1227   Sepsis Labs Invalid input(s): PROCALCITONIN,  WBC,  LACTICIDVEN Microbiology Recent Results  (from the past 240 hour(s))  Culture, blood (Routine x 2)     Status: None   Collection Time: 05/07/19 11:23 AM   Specimen: BLOOD  Result Value Ref Range Status   Specimen Description BLOOD RIGHT ANTECUBITAL  Final   Special Requests   Final    BOTTLES DRAWN AEROBIC AND ANAEROBIC Blood Culture adequate volume   Culture   Final    NO GROWTH 5 DAYS Performed at [facility], [address] [place], [place] [address]    Report Status 05/12/2019 FINAL  Final  Culture, blood (Routine x 2)     Status: None   Collection Time: 05/07/19 11:23 AM   Specimen: BLOOD  Result Value Ref Range Status   Specimen Description BLOOD BLOOD RIGHT HAND  Final   Special Requests   Final    BOTTLES DRAWN AEROBIC AND ANAEROBIC Blood Culture adequate volume   Culture   Final    NO GROWTH 5 DAYS Performed at [facility], [address] [place], [place] [address]    Report Status 05/12/2019 FINAL  Final  Respiratory Panel by RT PCR (Flu A&B, Covid) - Nasopharyngeal Swab     Status: None   Collection Time: 05/07/19  1:52 PM  Specimen: Nasopharyngeal Swab  Result Value Ref Range Status   SARS Coronavirus 2 by RT PCR NEGATIVE NEGATIVE Final    Comment: (NOTE) SARS-CoV-2 target nucleic acids are NOT DETECTED. The SARS-CoV-2 RNA is generally detectable in upper respiratoy specimens during the acute phase of infection. The lowest concentration of SARS-CoV-2 viral copies this assay can detect is 131 copies/mL. A negative result does not preclude SARS-Cov-2 infection and should not be used as the sole basis for treatment or other patient management decisions. A negative result may occur with  improper specimen collection/handling, submission of specimen other than nasopharyngeal swab, presence of viral mutation(s) within the areas targeted by this assay, and inadequate number of viral copies (<131 copies/mL). A negative result must be combined with clinical observations, patient history,  and epidemiological information. The expected result is Negative. Fact Sheet for Patients:  PinkCheek.be Fact Sheet for Healthcare Providers:  GravelBags.it This test is not yet ap proved or cleared by the [place] FDA and  has been authorized for detection and/or diagnosis of SARS-CoV-2 by FDA under an Emergency Use Authorization (EUA). This EUA will remain  in effect (meaning this test can be used) for the duration of the COVID-19 declaration under Section 564(b)(1) of the Act, 21 U.S.C. section 360bbb-3(b)(1), unless the authorization is terminated or revoked sooner.    Influenza A by PCR NEGATIVE NEGATIVE Final   Influenza B by PCR NEGATIVE NEGATIVE Final    Comment: (NOTE) The Xpert Xpress SARS-CoV-2/FLU/RSV assay is intended as an aid in  the diagnosis of influenza from Nasopharyngeal swab specimens and  should not be used as a sole basis for treatment. Nasal washings and  aspirates are unacceptable for Xpert Xpress SARS-CoV-2/FLU/RSV  testing. Fact Sheet for Patients: PinkCheek.be Fact Sheet for Healthcare Providers: GravelBags.it This test is not yet approved or cleared by the [place] FDA and  has been authorized for detection and/or diagnosis of SARS-CoV-2 by  FDA under an Emergency Use Authorization (EUA). This EUA will remain  in effect (meaning this test can be used) for the duration of the  Covid-19 declaration under Section 564(b)(1) of the Act, 21  U.S.C. section 360bbb-3(b)(1), unless the authorization is  terminated or revoked. Performed at [facility], [address] [place], [place] [address]   MRSA PCR Screening     Status: None   Collection Time: 05/09/19 12:45 AM   Specimen: Nasopharyngeal  Result Value Ref Range Status   MRSA by PCR NEGATIVE NEGATIVE Final    Comment:        The GeneXpert MRSA Assay (FDA approved for  NASAL specimens only), is one component of a comprehensive MRSA colonization surveillance program. It is not intended to diagnose MRSA infection nor to guide or monitor treatment for MRSA infections. Performed at [facility], [place], [place] [address]   SARS CORONAVIRUS 2 (TAT 6-24 HRS) Nasopharyngeal Nasopharyngeal Swab     Status: None   Collection Time: 05/11/19  2:19 PM   Specimen: Nasopharyngeal Swab  Result Value Ref Range Status   SARS Coronavirus 2 NEGATIVE NEGATIVE Final    Comment: (NOTE) SARS-CoV-2 target nucleic acids are NOT DETECTED. The SARS-CoV-2 RNA is generally detectable in upper and lower respiratory specimens during the acute phase of infection. Negative results do not preclude SARS-CoV-2 infection, do not rule out co-infections with other pathogens, and should not be used as the sole basis for treatment or other patient management decisions. Negative results must be combined with clinical  observations, patient history, and epidemiological information. The expected result is Negative. Fact Sheet for Patients: SugarRoll.be Fact Sheet for Healthcare Providers: https://www.woods-mathews.com/ This test is not yet approved or cleared by the [place] FDA and  has been authorized for detection and/or diagnosis of SARS-CoV-2 by FDA under an Emergency Use Authorization (EUA). This EUA will remain  in effect (meaning this test can be used) for the duration of the COVID-19 declaration under Section 56 4(b)(1) of the Act, 21 U.S.C. section 360bbb-3(b)(1), unless the authorization is terminated or revoked sooner. Performed at [facility], [place] [address] [place], [place] [address]      Total time spend on discharging this patient, including the last patient exam, discussing the hospital stay, instructions for ongoing care as it relates to all pertinent caregivers, as well as preparing the  medical discharge records, prescriptions, and/or referrals as applicable, is 30 minutes.    Enzo Bi, MD  [facility] 05/14/2019, 12:55 PM  If 7PM-7AM, please contact night-coverage

## 2019-05-14 NOTE — TOC Transition Note (Signed)
Transition of Care [facility]) - CM/SW Discharge Note   Patient Details  Name: [name] MRN: [id number] Date of Birth: 09-21-1945  Transition of Care [facility]) CM/SW Contact:  Eileen Stanford, LCSW Phone Number: 05/14/2019, 1:48 PM   Clinical Narrative:   Clinical Social Worker facilitated patient discharge including contacting patient family and facility to confirm patient discharge plans.  Clinical information faxed to facility and family agreeable with plan.  CSW arranged ambulance transport via [facility] to [facility].  RN to call for report prior to discharge.   Final next level of care: Skilled Nursing Facility Barriers to Discharge: Continued Medical Work up   Patient Goals and CMS Choice        Discharge Placement              Patient chooses bed at: [facility] Patient to be transferred to facility by: [facility] Name of family member notified: daughter Patient and family notified of of transfer: 05/14/19  Discharge Plan and Services In-house Referral: Clinical Social Work   Post Acute Care Choice: [place]                               Social Determinants of Health (SDOH) Interventions     Readmission Risk Interventions No flowsheet data found.

## 2019-05-15 ENCOUNTER — Other Ambulatory Visit: Payer: Self-pay | Admitting: Oncology

## 2019-05-15 DIAGNOSIS — D75839 Thrombocytosis, unspecified: Secondary | ICD-10-CM

## 2019-05-15 DIAGNOSIS — D473 Essential (hemorrhagic) thrombocythemia: Secondary | ICD-10-CM

## 2019-05-15 NOTE — Progress Notes (Signed)
cbc

## 2019-05-22 ENCOUNTER — Encounter: Payer: Self-pay | Admitting: Oncology

## 2019-05-22 ENCOUNTER — Inpatient Hospital Stay: Payer: Medicare Other

## 2019-05-22 ENCOUNTER — Other Ambulatory Visit: Payer: Self-pay

## 2019-05-22 ENCOUNTER — Inpatient Hospital Stay: Payer: Medicare Other | Attending: Oncology | Admitting: Oncology

## 2019-05-22 VITALS — BP 123/69 | HR 57 | Temp 96.3°F | Resp 12

## 2019-05-22 DIAGNOSIS — D75839 Thrombocytosis, unspecified: Secondary | ICD-10-CM

## 2019-05-22 DIAGNOSIS — D473 Essential (hemorrhagic) thrombocythemia: Secondary | ICD-10-CM | POA: Diagnosis not present

## 2019-05-22 DIAGNOSIS — D649 Anemia, unspecified: Secondary | ICD-10-CM

## 2019-05-22 DIAGNOSIS — F329 Major depressive disorder, single episode, unspecified: Secondary | ICD-10-CM | POA: Diagnosis not present

## 2019-05-22 DIAGNOSIS — Z7982 Long term (current) use of aspirin: Secondary | ICD-10-CM | POA: Insufficient documentation

## 2019-05-22 DIAGNOSIS — D751 Secondary polycythemia: Secondary | ICD-10-CM | POA: Diagnosis present

## 2019-05-22 DIAGNOSIS — F17210 Nicotine dependence, cigarettes, uncomplicated: Secondary | ICD-10-CM | POA: Diagnosis not present

## 2019-05-22 DIAGNOSIS — Z79899 Other long term (current) drug therapy: Secondary | ICD-10-CM | POA: Insufficient documentation

## 2019-05-22 DIAGNOSIS — E785 Hyperlipidemia, unspecified: Secondary | ICD-10-CM | POA: Insufficient documentation

## 2019-05-22 DIAGNOSIS — I10 Essential (primary) hypertension: Secondary | ICD-10-CM | POA: Insufficient documentation

## 2019-05-22 DIAGNOSIS — M109 Gout, unspecified: Secondary | ICD-10-CM | POA: Diagnosis not present

## 2019-05-22 LAB — COMPREHENSIVE METABOLIC PANEL
ALT: 17 U/L (ref 0–44)
AST: 21 U/L (ref 15–41)
Albumin: 3.2 g/dL — ABNORMAL LOW (ref 3.5–5.0)
Alkaline Phosphatase: 64 U/L (ref 38–126)
Anion gap: 7 (ref 5–15)
BUN: 16 mg/dL (ref 8–23)
CO2: 26 mmol/L (ref 22–32)
Calcium: 8.2 mg/dL — ABNORMAL LOW (ref 8.9–10.3)
Chloride: 105 mmol/L (ref 98–111)
Creatinine, Ser: 1.05 mg/dL (ref 0.61–1.24)
GFR calc Af Amer: >60 mL/min (ref >60)
GFR calc non Af Amer: >60 mL/min (ref >60)
Glucose, Bld: 99 mg/dL (ref 70–99)
Potassium: 4.1 mmol/L (ref 3.5–5.1)
Sodium: 138 mmol/L (ref 135–145)
Total Bilirubin: 0.4 mg/dL (ref 0.3–1.2)
Total Protein: 7.7 g/dL (ref 6.5–8.1)

## 2019-05-22 LAB — CBC WITH DIFFERENTIAL/PLATELET
Abs Immature Granulocytes: 0.01 10*3/uL (ref 0.00–0.07)
Basophils Absolute: 0.1 10*3/uL (ref 0.0–0.1)
Basophils Relative: 1 %
Eosinophils Absolute: 0.2 10*3/uL (ref 0.0–0.5)
Eosinophils Relative: 2 %
HCT: 34.6 % — ABNORMAL LOW (ref 39.0–52.0)
Hemoglobin: 11.1 g/dL — ABNORMAL LOW (ref 13.0–17.0)
Immature Granulocytes: 0 %
Lymphocytes Relative: 29 %
Lymphs Abs: 2.1 10*3/uL (ref 0.7–4.0)
MCH: 27.7 pg (ref 26.0–34.0)
MCHC: 32.1 g/dL (ref 30.0–36.0)
MCV: 86.3 fL (ref 80.0–100.0)
Monocytes Absolute: 0.7 10*3/uL (ref 0.1–1.0)
Monocytes Relative: 9 %
Neutro Abs: 4.4 10*3/uL (ref 1.7–7.7)
Neutrophils Relative %: 59 %
Platelets: 778 10*3/uL — ABNORMAL HIGH (ref 150–400)
RBC: 4.01 MIL/uL — ABNORMAL LOW (ref 4.22–5.81)
RDW: 14.6 % (ref 11.5–15.5)
WBC: 7.4 10*3/uL (ref 4.0–10.5)
nRBC: 0 % (ref 0.0–0.2)

## 2019-05-22 NOTE — Progress Notes (Signed)
Patient is a resident at [facility] facility and is here for hospital f/u.

## 2019-05-22 NOTE — Progress Notes (Signed)
Hematology/Oncology Follow Up Note [facility]  [phone] [phone]  Patient Care Team: Enzo Bi, MD as PCP - General (Internal Medicine)   Name of the patient: Marc Smith  [id number]  [birth year]   REASON FOR VISIT  posthospitalization follow-up, thrombocytosis  INTERVAL HISTORY Patient was seen by me during his recent admission from 05/07/2019 to 05/14/2019. Patient was admitted due to aspiration pneumonia, gout.  Was also noticed to have thrombocytosis. Patient reports feeling well today.  No fever, chills.  He is a poor historian.  [facility]  Review of Systems  Constitutional: Negative for appetite change, chills, fatigue, fever and unexpected weight change.  HENT:   Negative for hearing loss and voice change.   Eyes: Negative for eye problems and icterus.  Respiratory: Negative for chest tightness, cough and shortness of breath.   Cardiovascular: Negative for chest pain and leg swelling.  Gastrointestinal: Negative for abdominal distention and abdominal pain.  Endocrine: Negative for hot flashes.  Genitourinary: Negative for difficulty urinating, dysuria and frequency.   Musculoskeletal: Negative for arthralgias.  Skin: Negative for itching and rash.  Neurological: Negative for light-headedness and numbness.  Hematological: Negative for adenopathy. Does not bruise/bleed easily.  Psychiatric/Behavioral: Negative for confusion.      No Known Allergies   Past Medical History:  Diagnosis Date  . Depression   . Gout    Per Patient's nephew  . Hyperlipidemia    Patient's nephew  . Hypertension      History reviewed. No pertinent surgical history.  Social History   Socioeconomic History  . Marital status: Single    Spouse name: Not on file  . Number of children: Not on file  . Years of education: Not on file  . Highest education level: Not on file  Occupational  History  . Not on file  Tobacco Use  . Smoking status: Current Every Day Smoker    Packs/day: 1.00    Types: Cigarettes  . Smokeless tobacco: Current User  Substance and Sexual Activity  . Alcohol use: No  . Drug use: No  . Sexual activity: Not on file  Other Topics Concern  . Not on file  Social History Narrative  . Not on file   Social Determinants of Health   Financial Resource Strain:   . Difficulty of Paying Living Expenses:   Food Insecurity:   . Worried About Charity fundraiser in the Last Year:   . Arboriculturist in the Last Year:   Transportation Needs:   . Film/video editor (Medical):   Marland Kitchen Lack of Transportation (Non-Medical):   Physical Activity:   . Days of Exercise per Week:   . Minutes of Exercise per Session:   Stress:   . Feeling of Stress :   Social Connections:   . Frequency of Communication with Friends and Family:   . Frequency of Social Gatherings with Friends and Family:   . Attends Religious Services:   . Active Member of Clubs or Organizations:   . Attends Archivist Meetings:   Marland Kitchen Marital Status:   Intimate Partner Violence:   . Fear of Current or Ex-Partner:   . Emotionally Abused:   Marland Kitchen Physically Abused:   . Sexually Abused:     History reviewed. No pertinent family history.   Current Outpatient Medications:  .  allopurinol (ZYLOPRIM) 100 MG tablet, Take 100 mg by mouth daily., Disp: , Rfl:  .  amLODipine (NORVASC) 10 MG tablet, Take 10 mg by mouth daily., Disp: , Rfl:  .  aspirin 81 MG chewable tablet, Chew 1 tablet (81 mg total) by mouth daily., Disp:  , Rfl:  .  atorvastatin (LIPITOR) 40 MG tablet, Take 40 mg by mouth daily., Disp: , Rfl:  .  carvedilol (COREG) 25 MG tablet, Take 25 mg by mouth 2 (two) times daily with a meal., Disp: , Rfl:  .  feeding supplement, ENSURE ENLIVE, (ENSURE ENLIVE) LIQD, Take 237 mLs by mouth 2 (two) times daily between meals., Disp: 237 mL, Rfl: 12 .  sertraline (ZOLOFT) 50 MG tablet,  Take 50 mg by mouth daily., Disp: , Rfl:  .  lisinopril (ZESTRIL) 40 MG tablet, Hold until outpatient followup due to low normal BP in the hospital. (Patient not taking: Reported on 05/22/2019), Disp: , Rfl:   Physical exam:  Vitals:   05/22/19 1118  BP: 123/69  Pulse: (!) 57  Resp: 12  Temp: (!) 96.3 F (35.7 C)   Physical Exam Constitutional:      General: He is not in acute distress.    Comments: Frail appearance, he sits in the wheelchair.  HENT:     Head: Normocephalic and atraumatic.  Eyes:     General: No scleral icterus. Cardiovascular:     Rate and Rhythm: Normal rate and regular rhythm.     Heart sounds: Normal heart sounds.  Pulmonary:     Effort: Pulmonary effort is normal. No respiratory distress.     Breath sounds: No wheezing.  Abdominal:     General: Bowel sounds are normal. There is no distension.     Palpations: Abdomen is soft.  Musculoskeletal:        General: No deformity. Normal range of motion.     Cervical back: Normal range of motion and neck supple.  Skin:    General: Skin is warm and dry.     Findings: No erythema or rash.  Neurological:     Mental Status: He is alert and oriented to person, place, and time. Mental status is at baseline.  Psychiatric:     Comments: Flat affect     CMP Latest Ref Rng & Units 05/22/2019  Glucose 70 - 99 mg/dL 99  BUN 8 - 23 mg/dL 16  Creatinine 0.61 - 1.24 mg/dL 1.05  Sodium 135 - 145 mmol/L 138  Potassium 3.5 - 5.1 mmol/L 4.1  Chloride 98 - 111 mmol/L 105  CO2 22 - 32 mmol/L 26  Calcium 8.9 - 10.3 mg/dL 8.2(L)  Total Protein 6.5 - 8.1 g/dL 7.7  Total Bilirubin 0.3 - 1.2 mg/dL 0.4  Alkaline Phos 38 - 126 U/L 64  AST 15 - 41 U/L 21  ALT 0 - 44 U/L 17   CBC Latest Ref Rng & Units 05/22/2019  WBC 4.0 - 10.5 K/uL 7.4  Hemoglobin 13.0 - 17.0 g/dL 11.1(L)  Hematocrit 39.0 - 52.0 % 34.6(L)  Platelets 150 - 400 K/uL 778(H)    RADIOGRAPHIC STUDIES: I have personally reviewed the radiological images as  listed and agreed with the findings in the report. DG Chest 2 View  Result Date: 05/07/2019 CLINICAL DATA:  Tachycardia EXAM: CHEST - 2 VIEW COMPARISON:  November 20, 2016. FINDINGS: There is ill-defined opacity in the lung bases without consolidation. Heart size and pulmonary vascularity are normal. No adenopathy. There is elevation of the left hemidiaphragm. Visualized bowel appears mildly dilated without appreciable air-fluid levels. No free air. There are old healed  rib fractures on the right. IMPRESSION: 1. Ill-defined opacity in each lung base, concerning for atypical organism pneumonia. No consolidation. Lungs elsewhere clear. 2. Elevation of left hemidiaphragm is stable. Visualized bowel appear somewhat dilated. Question a degree of underlying ileus. 3.  Heart size normal. 4.  No evident adenopathy. Electronically Signed   By: Lowella Grip III M.D.   On: 05/07/2019 12:06   DG Ankle Complete Right  Result Date: 05/07/2019 CLINICAL DATA:  Foot pain EXAM: RIGHT ANKLE - COMPLETE 3+ VIEW COMPARISON:  None. FINDINGS: Alignment is anatomic. No acute fracture. Joint spaces are preserved. Vascular calcification is noted. Metallic fragments overlie the distal fibula. IMPRESSION: No acute osseous abnormality. Electronically Signed   By: Macy Mis M.D.   On: 05/07/2019 13:27   DG Abd 1 View  Result Date: 05/08/2019 CLINICAL DATA:  Impacted stool EXAM: ABDOMEN - 1 VIEW COMPARISON:  05/07/2019 FINDINGS: Mild diffuse increased bowel gas throughout. No definitive obstructive pattern. Mild stool at the rectum. Vascular calcifications IMPRESSION: Overall nonobstructed gas pattern. Small amount of retained stool at the rectum Electronically Signed   By: Donavan Foil M.D.   On: 05/08/2019 19:55   DG Abd 1 View  Result Date: 05/07/2019 CLINICAL DATA:  Bowel obstruction. EXAM: ABDOMEN - 1 VIEW COMPARISON:  None. FINDINGS: There are multiple old healed right-sided rib fractures. There is gaseous  distention of loops of colon scattered throughout the abdomen. There is a moderate amount of stool at the level of the rectum. There is no pneumatosis. No definite free air. No evidence for a small bowel obstruction. There are degenerative changes throughout the visualized lumbar spine. 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 IMPRESSION: 1. Gaseous distention of the colon which may represent a colonic ileus. 2. No evidence for a small bowel obstruction. 3. Moderate amount of stool at the level of the rectum. Electronically Signed   By: Constance Holster M.D.   On: 05/07/2019 21:52   CT Angio Chest PE W and/or Wo Contrast  Result Date: 05/07/2019 CLINICAL DATA:  Pneumonia. EXAM: CT ANGIOGRAPHY CHEST WITH CONTRAST TECHNIQUE: Multidetector CT imaging of the chest was performed using the standard protocol during bolus administration of intravenous contrast. Multiplanar CT image reconstructions and MIPs were obtained to evaluate the vascular anatomy. CONTRAST:  64mL OMNIPAQUE IOHEXOL 350 MG/ML SOLN COMPARISON:  None. FINDINGS: Cardiovascular: Contrast injection is sufficient to demonstrate satisfactory opacification of the pulmonary arteries to the segmental level. There is no pulmonary embolus. The main pulmonary artery is within normal limits for size. There is no CT evidence of acute right heart strain. There are atherosclerotic changes of the visualized aorta without evidence for an aneurysm or dissection. Heart size is borderline enlarged. Coronary artery calcifications are noted. Mediastinum/Nodes: --No mediastinal or hilar lymphadenopathy. --No axillary lymphadenopathy. --No supraclavicular lymphadenopathy. --there is a small left-sided thyroid nodule, for which no further follow-up is required. --The esophagus is unremarkable Lungs/Pleura: There is a moderate amount of debris within the right mainstem bronchus. There is no pneumothorax. No large pleural effusion. No focal infiltrate. There is  elevation of the left hemidiaphragm with atelectasis at the left lung base. Upper Abdomen: There are probable nonobstructing stones in the upper pole the left kidney. There is a cyst arising from the upper pole of the right kidney. Musculoskeletal: No chest wall abnormality. No acute or significant osseous findings. There are old healed right-sided rib fractures. Review of the MIP images confirms the above findings. IMPRESSION: 1. No CT evidence for acute pulmonary embolus.  2. Moderate amount of debris within the right mainstem bronchus, which may indicate aspiration. 3. Elevation of the left hemidiaphragm with atelectasis at the left lung base. No focal infiltrate. 4. Aortic Atherosclerosis (ICD10-I70.0). Electronically Signed   By: Constance Holster M.D.   On: 05/07/2019 16:10   US Venous Img Lower Bilateral (DVT)  Result Date: 05/07/2019 CLINICAL DATA:  Edema EXAM: BILATERAL LOWER EXTREMITY VENOUS DOPPLER ULTRASOUND TECHNIQUE: Gray-scale sonography with compression, as well as color and duplex ultrasound, were performed to evaluate the deep venous system(s) from the level of the common femoral vein through the popliteal and proximal calf veins. COMPARISON:  02/08/2012 FINDINGS: VENOUS Normal compressibility of the common femoral, superficial femoral, and popliteal veins, as well as the visualized calf veins. Visualized portions of profunda femoral vein and great saphenous vein unremarkable. No filling defects to suggest DVT on grayscale or color Doppler imaging. Doppler waveforms show normal direction of venous flow, normal respiratory phasicity and response to augmentation. Calf veins are poorly visualized bilaterally. OTHER None. Limitations: none IMPRESSION: No femoropopliteal DVT nor evidence of DVT within the visualized calf veins. If clinical symptoms are inconsistent or if there are persistent or worsening symptoms, further imaging (possibly involving the iliac veins) may be warranted. Electronically  Signed   By: Lavonia Dana M.D.   On: 05/07/2019 18:01   ECHOCARDIOGRAM COMPLETE  Result Date: 05/08/2019    ECHOCARDIOGRAM REPORT   Patient Name:   Marc Smith Date of Exam: 05/08/2019 Medical Rec #:  [id number]       Height:       72.0 in Accession #:    [id number]      Weight:       183.6 lb Date of Birth:  March 05, 1945        BSA:          2.054 m Patient Age:    86 years        BP:           178/85 mmHg Patient Gender: M               HR:           101 bpm. Exam Location:  [facility] Procedure: 2D Echo, Cardiac Doppler and Color Doppler Indications:     Elevated troponin  History:         Patient has no prior history of Echocardiogram examinations.                  Risk Factors:Hypertension.  Sonographer:     Sherrie Sport RDCS (AE) Referring Phys:  [id number] Alben Deeds JR Diagnosing Phys: Bartholome Bill MD  Sonographer Comments: Technically difficult study due to poor echo windows. IMPRESSIONS  1. Dextrocardia. Left ventricular ejection fraction, by estimation, is 50 to 55%. The left ventricle has low normal function. The left ventricle has no regional wall motion abnormalities. Left ventricular diastolic parameters were normal.  2. Right ventricular systolic function is normal. The right ventricular size is normal.  3. The mitral valve was not well visualized. Trivial mitral valve regurgitation.  4. The aortic valve was not well visualized. Aortic valve regurgitation is not visualized. FINDINGS  Left Ventricle: Dextrocardia. Left ventricular ejection fraction, by estimation, is 50 to 55%. The left ventricle has low normal function. The left ventricle has no regional wall motion abnormalities. The left ventricular internal cavity size was normal  in size. There is borderline left ventricular hypertrophy. Left ventricular diastolic parameters were normal. Right Ventricle: The  right ventricular size is normal. No increase in right ventricular wall thickness. Right ventricular systolic function is normal. Left Atrium:  Left atrial size was normal in size. Right Atrium: Right atrial size was normal in size. Pericardium: There is no evidence of pericardial effusion. Mitral Valve: The mitral valve was not well visualized. Trivial mitral valve regurgitation. Tricuspid Valve: The tricuspid valve is not well visualized. Tricuspid valve regurgitation is mild. Aortic Valve: The aortic valve was not well visualized. Aortic valve regurgitation is not visualized. Pulmonic Valve: The pulmonic valve was not well visualized. Pulmonic valve regurgitation is not visualized. Aorta: The aortic root was not well visualized. IAS/Shunts: The interatrial septum was not assessed.  LEFT VENTRICLE PLAX 2D LVIDd:         4.21 cm LVIDs:         3.29 cm LV PW:         1.34 cm LV IVS:        1.39 cm LVOT diam:     2.20 cm LVOT Area:     3.80 cm  LEFT ATRIUM         Index LA diam:    2.90 cm 1.41 cm/m                        PULMONIC VALVE AORTA                 PV Vmax:        0.90 m/s Ao Root diam: 3.00 cm PV Peak grad:   3.2 mmHg                       RVOT Peak grad: 4 mmHg   SHUNTS Systemic Diam: 2.20 cm Bartholome Bill MD Electronically signed by Bartholome Bill MD Signature Date/Time: 05/08/2019/12:23:08 PM    Final      Assessment and plan 1. Thrombocytosis ([place])   2. Normocytic anemia    # Thrombocytosis.  Repeat cbc was reviewed and discussed with patient.  Persistently elevated, platelet is 778,000 Reactive vs myeloproliferative disease.  Check JAK2 [id number] mutation , with reflex to other mutations CALR, MPL, JAK 2 Ex 12-15 mutations  #Normocytic anemia.  Adequate iron store.  Check B12 and folate level at next visit.    Earlie Server, MD, PhD Hematology Oncology [facility] Pager- [phone] 05/22/2019

## 2019-05-27 LAB — JAK2 V617F, W REFLEX TO CALR/E12/MPL

## 2019-05-28 ENCOUNTER — Telehealth: Payer: Self-pay

## 2019-05-28 NOTE — Telephone Encounter (Signed)
-----   Message from Earlie Server, MD sent at 05/27/2019  4:51 PM EDT ----- Please move his appointment to this week. MD only. Also encourage if any family member wants to come. Need to discuss about starting hydroxyurea. Thank  you

## 2019-05-30 ENCOUNTER — Encounter: Payer: Self-pay | Admitting: Oncology

## 2019-05-30 ENCOUNTER — Other Ambulatory Visit: Payer: Self-pay

## 2019-05-30 ENCOUNTER — Inpatient Hospital Stay: Payer: Medicare Other | Attending: Oncology | Admitting: Oncology

## 2019-05-30 VITALS — BP 147/87 | HR 49 | Temp 95.8°F | Resp 16

## 2019-05-30 DIAGNOSIS — I700 Atherosclerosis of aorta: Secondary | ICD-10-CM | POA: Diagnosis not present

## 2019-05-30 DIAGNOSIS — M109 Gout, unspecified: Secondary | ICD-10-CM | POA: Insufficient documentation

## 2019-05-30 DIAGNOSIS — I10 Essential (primary) hypertension: Secondary | ICD-10-CM | POA: Diagnosis not present

## 2019-05-30 DIAGNOSIS — F17210 Nicotine dependence, cigarettes, uncomplicated: Secondary | ICD-10-CM | POA: Diagnosis not present

## 2019-05-30 DIAGNOSIS — E785 Hyperlipidemia, unspecified: Secondary | ICD-10-CM | POA: Diagnosis not present

## 2019-05-30 DIAGNOSIS — Z7982 Long term (current) use of aspirin: Secondary | ICD-10-CM | POA: Insufficient documentation

## 2019-05-30 DIAGNOSIS — R000 Tachycardia, unspecified: Secondary | ICD-10-CM | POA: Diagnosis not present

## 2019-05-30 DIAGNOSIS — Z79899 Other long term (current) drug therapy: Secondary | ICD-10-CM | POA: Insufficient documentation

## 2019-05-30 DIAGNOSIS — F329 Major depressive disorder, single episode, unspecified: Secondary | ICD-10-CM | POA: Diagnosis not present

## 2019-05-30 DIAGNOSIS — E041 Nontoxic single thyroid nodule: Secondary | ICD-10-CM | POA: Insufficient documentation

## 2019-05-30 DIAGNOSIS — D473 Essential (hemorrhagic) thrombocythemia: Secondary | ICD-10-CM | POA: Insufficient documentation

## 2019-05-30 DIAGNOSIS — Z1589 Genetic susceptibility to other disease: Secondary | ICD-10-CM

## 2019-05-30 NOTE — Progress Notes (Signed)
Hematology/Oncology Follow Up Note [facility]  [phone] [phone]  Patient Care Team: Enzo Bi, MD as PCP - General (Internal Medicine)   Name of the patient: Marc Smith  [id number]  08/05/1945   REASON FOR VISIT  posthospitalization follow-up, thrombocytosis  INTERVAL HISTORY Patient was seen by me during his recent admission from 05/07/2019 to 05/14/2019. Patient was accompanied by his nephew breath to today's visit. Patient has no new complaints.  Review of Systems  Constitutional: Negative for appetite change, chills, fatigue, fever and unexpected weight change.  HENT:   Negative for hearing loss and voice change.   Eyes: Negative for eye problems and icterus.  Respiratory: Negative for chest tightness, cough and shortness of breath.   Cardiovascular: Negative for chest pain and leg swelling.  Gastrointestinal: Negative for abdominal distention and abdominal pain.  Endocrine: Negative for hot flashes.  Genitourinary: Negative for difficulty urinating, dysuria and frequency.   Musculoskeletal: Negative for arthralgias.  Skin: Negative for itching and rash.  Neurological: Negative for light-headedness and numbness.  Hematological: Negative for adenopathy. Does not bruise/bleed easily.  Psychiatric/Behavioral: Negative for confusion.      No Known Allergies   Past Medical History:  Diagnosis Date  . Depression   . Gout    Per Patient's nephew  . Hyperlipidemia    Patient's nephew  . Hypertension      History reviewed. No pertinent surgical history.  Social History   Socioeconomic History  . Marital status: Single    Spouse name: Not on file  . Number of children: Not on file  . Years of education: Not on file  . Highest education level: Not on file  Occupational History  . Not on file  Tobacco Use  . Smoking status: Current Every Day Smoker    Packs/day: 1.00    Types: Cigarettes  . Smokeless  tobacco: Current User  Substance and Sexual Activity  . Alcohol use: No  . Drug use: No  . Sexual activity: Not on file  Other Topics Concern  . Not on file  Social History Narrative  . Not on file   Social Determinants of Health   Financial Resource Strain:   . Difficulty of Paying Living Expenses:   Food Insecurity:   . Worried About Charity fundraiser in the Last Year:   . Arboriculturist in the Last Year:   Transportation Needs:   . Film/video editor (Medical):   Marland Kitchen Lack of Transportation (Non-Medical):   Physical Activity:   . Days of Exercise per Week:   . Minutes of Exercise per Session:   Stress:   . Feeling of Stress :   Social Connections:   . Frequency of Communication with Friends and Family:   . Frequency of Social Gatherings with Friends and Family:   . Attends Religious Services:   . Active Member of Clubs or Organizations:   . Attends Archivist Meetings:   Marland Kitchen Marital Status:   Intimate Partner Violence:   . Fear of Current or Ex-Partner:   . Emotionally Abused:   Marland Kitchen Physically Abused:   . Sexually Abused:     History reviewed. No pertinent family history.   Current Outpatient Medications:  .  allopurinol (ZYLOPRIM) 100 MG tablet, Take 100 mg by mouth daily., Disp: , Rfl:  .  amLODipine (NORVASC) 10 MG tablet, Take 10 mg by mouth daily., Disp: , Rfl:  .  aspirin 81 MG chewable tablet,  Chew 1 tablet (81 mg total) by mouth daily., Disp:  , Rfl:  .  atorvastatin (LIPITOR) 40 MG tablet, Take 40 mg by mouth daily., Disp: , Rfl:  .  carvedilol (COREG) 25 MG tablet, Take 25 mg by mouth 2 (two) times daily with a meal., Disp: , Rfl:  .  feeding supplement, ENSURE ENLIVE, (ENSURE ENLIVE) LIQD, Take 237 mLs by mouth 2 (two) times daily between meals., Disp: 237 mL, Rfl: 12 .  sertraline (ZOLOFT) 50 MG tablet, Take 50 mg by mouth daily., Disp: , Rfl:  .  lisinopril (ZESTRIL) 40 MG tablet, Hold until outpatient followup due to low normal BP in the  hospital. (Patient not taking: Reported on 05/22/2019), Disp: , Rfl:   Physical exam:  Vitals:   05/30/19 0940  BP: (!) 147/87  Pulse: (!) 49  Resp: 16  Temp: (!) 95.8 F (35.4 C)   Physical Exam Constitutional:      General: He is not in acute distress.    Comments: Frail appearance, he sits in the wheelchair.  HENT:     Head: Normocephalic and atraumatic.  Eyes:     General: No scleral icterus. Cardiovascular:     Rate and Rhythm: Normal rate and regular rhythm.     Heart sounds: Normal heart sounds.  Pulmonary:     Effort: Pulmonary effort is normal. No respiratory distress.     Breath sounds: No wheezing.  Abdominal:     General: Bowel sounds are normal. There is no distension.     Palpations: Abdomen is soft.  Musculoskeletal:        General: No deformity. Normal range of motion.     Cervical back: Normal range of motion and neck supple.  Skin:    General: Skin is warm and dry.     Findings: No erythema or rash.  Neurological:     Mental Status: He is alert and oriented to person, place, and time. Mental status is at baseline.  Psychiatric:     Comments: Flat affect     CMP Latest Ref Rng & Units 05/22/2019  Glucose 70 - 99 mg/dL 99  BUN 8 - 23 mg/dL 16  Creatinine 0.61 - 1.24 mg/dL 1.05  Sodium 135 - 145 mmol/L 138  Potassium 3.5 - 5.1 mmol/L 4.1  Chloride 98 - 111 mmol/L 105  CO2 22 - 32 mmol/L 26  Calcium 8.9 - 10.3 mg/dL 8.2(L)  Total Protein 6.5 - 8.1 g/dL 7.7  Total Bilirubin 0.3 - 1.2 mg/dL 0.4  Alkaline Phos 38 - 126 U/L 64  AST 15 - 41 U/L 21  ALT 0 - 44 U/L 17   CBC Latest Ref Rng & Units 05/22/2019  WBC 4.0 - 10.5 K/uL 7.4  Hemoglobin 13.0 - 17.0 g/dL 11.1(L)  Hematocrit 39.0 - 52.0 % 34.6(L)  Platelets 150 - 400 K/uL 778(H)    RADIOGRAPHIC STUDIES: I have personally reviewed the radiological images as listed and agreed with the findings in the report. DG Chest 2 View  Result Date: 05/07/2019 CLINICAL DATA:  Tachycardia EXAM: CHEST - 2  VIEW COMPARISON:  November 20, 2016. FINDINGS: There is ill-defined opacity in the lung bases without consolidation. Heart size and pulmonary vascularity are normal. No adenopathy. There is elevation of the left hemidiaphragm. Visualized bowel appears mildly dilated without appreciable air-fluid levels. No free air. There are old healed rib fractures on the right. IMPRESSION: 1. Ill-defined opacity in each lung base, concerning for atypical organism pneumonia. No consolidation. Lungs  elsewhere clear. 2. Elevation of left hemidiaphragm is stable. Visualized bowel appear somewhat dilated. Question a degree of underlying ileus. 3.  Heart size normal. 4.  No evident adenopathy. Electronically Signed   By: Lowella Grip III M.D.   On: 05/07/2019 12:06   DG Ankle Complete Right  Result Date: 05/07/2019 CLINICAL DATA:  Foot pain EXAM: RIGHT ANKLE - COMPLETE 3+ VIEW COMPARISON:  None. FINDINGS: Alignment is anatomic. No acute fracture. Joint spaces are preserved. Vascular calcification is noted. Metallic fragments overlie the distal fibula. IMPRESSION: No acute osseous abnormality. Electronically Signed   By: Macy Mis M.D.   On: 05/07/2019 13:27   DG Abd 1 View  Result Date: 05/08/2019 CLINICAL DATA:  Impacted stool EXAM: ABDOMEN - 1 VIEW COMPARISON:  05/07/2019 FINDINGS: Mild diffuse increased bowel gas throughout. No definitive obstructive pattern. Mild stool at the rectum. Vascular calcifications IMPRESSION: Overall nonobstructed gas pattern. Small amount of retained stool at the rectum Electronically Signed   By: Donavan Foil M.D.   On: 05/08/2019 19:55   DG Abd 1 View  Result Date: 05/07/2019 CLINICAL DATA:  Bowel obstruction. EXAM: ABDOMEN - 1 VIEW COMPARISON:  None. FINDINGS: There are multiple old healed right-sided rib fractures. There is gaseous distention of loops of colon scattered throughout the abdomen. There is a moderate amount of stool at the level of the rectum. There is no  pneumatosis. No definite free air. No evidence for a small bowel obstruction. There are degenerative changes throughout the visualized lumbar spine. 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 IMPRESSION: 1. Gaseous distention of the colon which may represent a colonic ileus. 2. No evidence for a small bowel obstruction. 3. Moderate amount of stool at the level of the rectum. Electronically Signed   By: Constance Holster M.D.   On: 05/07/2019 21:52   CT Angio Chest PE W and/or Wo Contrast  Result Date: 05/07/2019 CLINICAL DATA:  Pneumonia. EXAM: CT ANGIOGRAPHY CHEST WITH CONTRAST TECHNIQUE: Multidetector CT imaging of the chest was performed using the standard protocol during bolus administration of intravenous contrast. Multiplanar CT image reconstructions and MIPs were obtained to evaluate the vascular anatomy. CONTRAST:  38m OMNIPAQUE IOHEXOL 350 MG/ML SOLN COMPARISON:  None. FINDINGS: Cardiovascular: Contrast injection is sufficient to demonstrate satisfactory opacification of the pulmonary arteries to the segmental level. There is no pulmonary embolus. The main pulmonary artery is within normal limits for size. There is no CT evidence of acute right heart strain. There are atherosclerotic changes of the visualized aorta without evidence for an aneurysm or dissection. Heart size is borderline enlarged. Coronary artery calcifications are noted. Mediastinum/Nodes: --No mediastinal or hilar lymphadenopathy. --No axillary lymphadenopathy. --No supraclavicular lymphadenopathy. --there is a small left-sided thyroid nodule, for which no further follow-up is required. --The esophagus is unremarkable Lungs/Pleura: There is a moderate amount of debris within the right mainstem bronchus. There is no pneumothorax. No large pleural effusion. No focal infiltrate. There is elevation of the left hemidiaphragm with atelectasis at the left lung base. Upper Abdomen: There are probable nonobstructing stones in the  upper pole the left kidney. There is a cyst arising from the upper pole of the right kidney. Musculoskeletal: No chest wall abnormality. No acute or significant osseous findings. There are old healed right-sided rib fractures. Review of the MIP images confirms the above findings. IMPRESSION: 1. No CT evidence for acute pulmonary embolus. 2. Moderate amount of debris within the right mainstem bronchus, which may indicate aspiration. 3. Elevation of the left hemidiaphragm with  atelectasis at the left lung base. No focal infiltrate. 4. Aortic Atherosclerosis (ICD10-I70.0). Electronically Signed   By: Constance Holster M.D.   On: 05/07/2019 16:10   US Venous Img Lower Bilateral (DVT)  Result Date: 05/07/2019 CLINICAL DATA:  Edema EXAM: BILATERAL LOWER EXTREMITY VENOUS DOPPLER ULTRASOUND TECHNIQUE: Gray-scale sonography with compression, as well as color and duplex ultrasound, were performed to evaluate the deep venous system(s) from the level of the common femoral vein through the popliteal and proximal calf veins. COMPARISON:  02/08/2012 FINDINGS: VENOUS Normal compressibility of the common femoral, superficial femoral, and popliteal veins, as well as the visualized calf veins. Visualized portions of profunda femoral vein and great saphenous vein unremarkable. No filling defects to suggest DVT on grayscale or color Doppler imaging. Doppler waveforms show normal direction of venous flow, normal respiratory phasicity and response to augmentation. Calf veins are poorly visualized bilaterally. OTHER None. Limitations: none IMPRESSION: No femoropopliteal DVT nor evidence of DVT within the visualized calf veins. If clinical symptoms are inconsistent or if there are persistent or worsening symptoms, further imaging (possibly involving the iliac veins) may be warranted. Electronically Signed   By: Lavonia Dana M.D.   On: 05/07/2019 18:01   ECHOCARDIOGRAM COMPLETE  Result Date: 05/08/2019    ECHOCARDIOGRAM REPORT    Patient Name:   DEBRA CALABRETTA Date of Exam: 05/08/2019 Medical Rec #:  [id number]       Height:       72.0 in Accession #:    [id number]      Weight:       183.6 lb Date of Birth:  06/27/1945        BSA:          2.054 m Patient Age:    24 years        BP:           178/85 mmHg Patient Gender: M               HR:           101 bpm. Exam Location:  [facility] Procedure: 2D Echo, Cardiac Doppler and Color Doppler Indications:     Elevated troponin  History:         Patient has no prior history of Echocardiogram examinations.                  Risk Factors:Hypertension.  Sonographer:     Sherrie Sport RDCS (AE) Referring Phys:  [id number] Alben Deeds JR Diagnosing Phys: Bartholome Bill MD  Sonographer Comments: Technically difficult study due to poor echo windows. IMPRESSIONS  1. Dextrocardia. Left ventricular ejection fraction, by estimation, is 50 to 55%. The left ventricle has low normal function. The left ventricle has no regional wall motion abnormalities. Left ventricular diastolic parameters were normal.  2. Right ventricular systolic function is normal. The right ventricular size is normal.  3. The mitral valve was not well visualized. Trivial mitral valve regurgitation.  4. The aortic valve was not well visualized. Aortic valve regurgitation is not visualized. FINDINGS  Left Ventricle: Dextrocardia. Left ventricular ejection fraction, by estimation, is 50 to 55%. The left ventricle has low normal function. The left ventricle has no regional wall motion abnormalities. The left ventricular internal cavity size was normal  in size. There is borderline left ventricular hypertrophy. Left ventricular diastolic parameters were normal. Right Ventricle: The right ventricular size is normal. No increase in right ventricular wall thickness. Right ventricular systolic function is normal. Left Atrium: Left  atrial size was normal in size. Right Atrium: Right atrial size was normal in size. Pericardium: There is no evidence of  pericardial effusion. Mitral Valve: The mitral valve was not well visualized. Trivial mitral valve regurgitation. Tricuspid Valve: The tricuspid valve is not well visualized. Tricuspid valve regurgitation is mild. Aortic Valve: The aortic valve was not well visualized. Aortic valve regurgitation is not visualized. Pulmonic Valve: The pulmonic valve was not well visualized. Pulmonic valve regurgitation is not visualized. Aorta: The aortic root was not well visualized. IAS/Shunts: The interatrial septum was not assessed.  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 JAK-2 gene mutation    # Thrombocytosis.  Labs reviewed and discussed with patient and nephew Marc Smith.  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  Patient does not have leukocytosis or erythrocytosis. Patient has poor insight into her conditions.  He used to live with nephew Marc Smith.  Per nephew, patient does not make his health decisions.  Nephew does not have power of attorney. I discussed about bone marrow biopsy to confirm the diagnosis of essential thrombocythemia, rule out other myeloproliferative condition.  Rationale of bone marrow biopsy and potential side effects were discussed with patient.  For treatment of ET, I recommend hydroxyurea.  Rationale of hydroxyurea and potential side effects were discussed with patient nephew. Nephew would like to discuss with  his other family members and and update me about what they want to do .  I also recommend nephew to obtain official power of attorney paperwork.  Follow-up to be determined.  Pending patient/family physician. Earlie Server, MD, PhD Hematology Oncology [facility] Pager- [phone] 05/30/2019

## 2019-05-30 NOTE — Progress Notes (Signed)
Patient does not offer any problems today.  

## 2019-06-11 ENCOUNTER — Ambulatory Visit: Payer: Medicare Other | Admitting: Oncology

## 2019-06-20 ENCOUNTER — Telehealth: Payer: Self-pay

## 2019-06-20 NOTE — Telephone Encounter (Signed)
Contacted patient's nephew to follow up on decision to whether they would like to proceed with biopsy or not. No answer, Detailed voicemail was left for Wilfred Lacy to return call to clinic with decision.

## 2019-06-24 ENCOUNTER — Other Ambulatory Visit: Payer: Self-pay | Admitting: Internal Medicine

## 2019-06-24 DIAGNOSIS — Z9189 Other specified personal risk factors, not elsewhere classified: Secondary | ICD-10-CM

## 2019-06-25 ENCOUNTER — Ambulatory Visit
Admission: RE | Admit: 2019-06-25 | Discharge: 2019-06-25 | Disposition: A | Discharge status: Home / Self Care | Payer: Medicare Other | Source: Ambulatory Visit | Attending: Internal Medicine | Admitting: Internal Medicine

## 2019-06-25 ENCOUNTER — Other Ambulatory Visit: Payer: Self-pay

## 2019-06-25 DIAGNOSIS — Z9189 Other specified personal risk factors, not elsewhere classified: Secondary | ICD-10-CM | POA: Insufficient documentation

## 2019-06-25 DIAGNOSIS — R1312 Dysphagia, oropharyngeal phase: Secondary | ICD-10-CM | POA: Insufficient documentation

## 2019-06-25 NOTE — Therapy (Addendum)
[facility], [place], [address] Phone: [phone]   Fax:     Modified Barium Swallow  Patient Details  Name: Marc Smith MRN: [id number] Date of Birth: 1945-10-28 No data recorded  Encounter Date: 06/25/2019  End of Session - 06/25/19 1817    Visit Number  1    Number of Visits  1    Date for SLP Re-Evaluation  06/25/19    SLP Start Time  57    SLP Stop Time   1400    SLP Time Calculation (min)  60 min    Activity Tolerance  Patient tolerated treatment well       Past Medical History:  Diagnosis Date  . Depression   . Gout    Per Patient's nephew  . Hyperlipidemia    Patient's nephew  . Hypertension     No past surgical history on file.  There were no vitals filed for this visit.      Subjective: Patient behavior: (alertness, ability to follow instructions, etc.): [facility]. He engaged minimally; verbal responses were brief. He followed basic instructions given cues. Noted a more head down posture.  OM exam revealed Missing Dentition including many molars; lingual ROM and strength grossly WFL. No labial/lingual asymmetry.  Chief complaint: dysphagia   Objective:  Radiological Procedure: A videoflouroscopic evaluation of oral-preparatory, reflex initiation, and pharyngeal phases of the swallow was performed; as well as a screening of the upper esophageal phase.  I. POSTURE: upright; head down posture most often II. VIEW: lateral III. COMPENSATORY STRATEGIES: small, single boluses; Time IV. BOLUSES ADMINISTERED:  Thin Liquid: 5 trials  Nectar-thick Liquid: 2 trials  Honey-thick Liquid: NT  Puree: 2 trials  Mechanical Soft: 2 trials V. RESULTS OF EVALUATION: A. ORAL PREPARATORY PHASE: (The lips, tongue, and velum are observed for strength and coordination)       **Overall Severity Rating: Mild-Mod. Pt exhibited increased oral phase time w/ all  consistencies moreso consistencies of increased texture; noted increased mastication time/effort w/ softened solids -- may be impacted by missing Dentition, molars. Noted decreased oral/lingual control of liquids w/ premature spillage of liquids into the pharynx prior to swallow initiation. No significant oral residue remained post swallows. During the increased mastication and bolus management/prep time prior as A-P transfer plan was finally engaged, apparent saliva added to bolus material increasing the material spilling into the pharynx and to the Pyriform Sinuses.   B. SWALLOW INITIATION/REFLEX: (The reflex is normal if "triggered" by the time the bolus reached the base of the tongue)  **Overall Severity Rating: Mild-Mod. Pt exhibited min+ delayed pharyngeal swallow initiation moreso w/ liquid consistency trials. Thin and Nectar liquids appeared to trigger the pharyngeal swallow as they spilled from the Valleculae to the Pyriform Sinuses; purees and softened solids appeared to trigger the pharyngeal swallow at the Valleculae. Pyriform Sinuses appeared deep in presentation as bolus material collected - could suspect an ongoing issue. No aspiration was noted during this study.   C. PHARYNGEAL PHASE: (Pharyngeal function is normal if the bolus shows rapid, smooth, and continuous transit through the pharynx and there is no pharyngeal residue after the swallow)  **Overall Severity Rating: grossly WFL. No gross pharyngeal residue was noted post initial swallow indicating adequate laryngeal excursion and pharyngeal pressure during the swallow.   D. LARYNGEAL PENETRATION: (Material entering into the laryngeal inlet/vestibule but not aspirated): x1(undercoating of Epiglottis noted); no consistent occurrence or buildup of  penetrated residue  E. ASPIRATION: None noted during this study F. 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 No observed aspiration was noted to occur during this study, however, pt is at increased risk for such. Oral phase time for bolus mastication and organization for A-P transfer was increased. Given time, and using Small, single boluses appeared to aid pt's oropharyngeal control during swallowing.  Pt exhibited increased oral phase time w/ all consistencies moreso consistencies of increased texture; noted increased mastication time/effort w/ softened solids -- may be impacted by missing Dentition, molars. Noted min+ decreased oral/lingual control of liquids w/ premature spillage of liquids into the pharynx prior to swallow initiation. No significant oral residue remained post swallows. During the increased mastication and bolus management/prep time prior to A-P transfer plan engagement, apparent Saliva added to bolus material increasing the material spilling into the pharynx and to the Pyriform Sinuses. Pt exhibited delayed pharyngeal swallow initiation moreso w/ liquid consistencies. Thin and Nectar liquids appeared to trigger the pharyngeal swallow as they spilled from the Valleculae to the Pyriform Sinuses; purees and softened solids appeared to trigger the pharyngeal swallow at the Valleculae. Pyriform Sinuses appeared deep in size as bolus material collected - could suspect as ongoing issue. No aspiration was noted to occur during this study. No gross pharyngeal residue was noted post completion of swallowing indicating adequate laryngeal excursion and pharyngeal pressure during the swallow. No overt Cervical Esophageal dysmotility was noted during viewable screening.  Although pt did not exhibit overt Aspiration of po's during this study, he could potentially be at risk for such d/t the delay in pharyngeal swallowing initiation; decreased pharyngeal sensation of both bolus residue and  Saliva spilling into the pharynx(as pt is still completing oral phase management/prep for A-P transfer of majority of bolus material). Recommend monitoring of pt's toleration of diet, and of pt's Pulmonary status. Recommend aspiration precautions AND Pills given in Puree for safer swallowing. Education w/ Staff to help encourage follow through w/ precautions; preparation of foods/consistencies at meals.   PLAN/RECOMMENDATIONS:  A. Diet: Mech Soft diet w/ well-cut meats, Gravies added to moisten meats/foods; Pills in Puree - WHOLE (for safer swallowing)  B. Swallowing Precautions: aspiration precautions to include single small sips, slowly; No Straw  C. Recommended consultation to: Dietician for nutritional support such as Ensure supplement if needed for support  D. Therapy recommendations: f/u w/ skilled ST services for education and implementation of aspiration precautions during oral intake to reduce risk for aspiration, pulmonary decline  E. Results and recommendations were discussed w/ pt, video viewed and aspiration precautions practiced. Also discussed the results and aspiration precautions w/ Caregiver who was present w/ pt at this study.          Patient will benefit from skilled therapeutic intervention in order to improve the following deficits and impairments:  Education on use and follow through of aspiration precautions to reduce risk for aspiration Dysphagia, oropharyngeal phase  At risk for aspiration - Plan: DG SWALLOW FUNC OP MEDICARE SPEECH PATH, DG SWALLOW FUNC OP MEDICARE SPEECH PATH        Problem List Patient Active Problem List   Diagnosis Date Noted  . Stool incontinence 05/12/2019  . Urinary incontinence 05/12/2019  . Age-related physical debility 05/12/2019  . Hyperlipidemia 05/12/2019  . Acute pain of right lower extremity 05/08/2019  . Tachycardia  05/07/2019  . Aspiration pneumonia ([place])   . Elevated troponin I level   . Thrombocytosis ([place])   .  Leukocytosis   . Community acquired pneumonia   . UTI (urinary tract infection) 11/20/2016  . Acute lower UTI 11/20/2016  . Gout attack 11/20/2016  . Essential hypertension 11/20/2016      [name], [place], CCC-SLP Crist Kruszka 06/25/2019, 6:18 PM  [facility] [place], [place], [address] Phone: [phone]   Fax:     Name: Marc Smith MRN: [id number] Date of Birth: 05-05-1945

## 2019-06-27 ENCOUNTER — Telehealth: Payer: Self-pay | Admitting: Oncology

## 2019-06-27 NOTE — Telephone Encounter (Signed)
Called patient's nephew, Wilfred Lacy, to follow up on VM left last week regarding biopsy, but still no answer. Bailey Mech to call back.

## 2019-06-27 NOTE — Telephone Encounter (Signed)
Not able to reach patient or patient's nephew.  I called patient's niece Ms. Marc Smith who appreciates the call and I will discuss with patient.  Catina will update as about patient's decision.

## 2021-05-22 IMAGING — CT CT ANGIO CHEST
2 of 6 series · 18 of 46 positions shown · IV contrast (APPLIED)
Comparison: None.

CLINICAL DATA: Pneumonia.

EXAM:
CT ANGIOGRAPHY CHEST WITH CONTRAST
TECHNIQUE: Multidetector CT imaging of the chest was performed using the
standard protocol during bolus administration of intravenous
contrast. Multiplanar CT image reconstructions and MIPs were
obtained to evaluate the vascular anatomy.
CONTRAST:  75mL OMNIPAQUE IOHEXOL 350 MG/ML SOLN

[Series 5: thins · axial · 0.74mm/px · z∈[-295,-24]mm · 16 of 297 slices shown]
[im 13/297  lung]
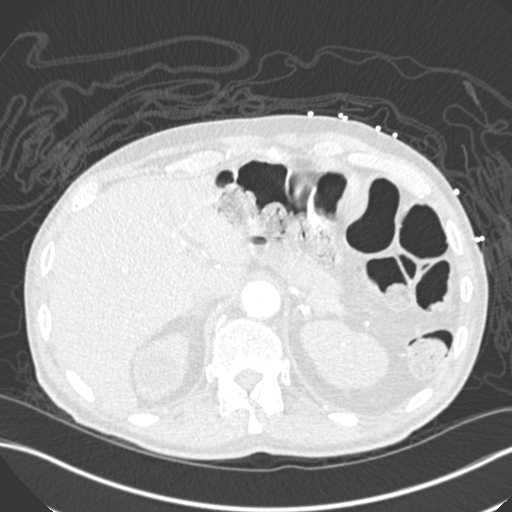
[im 39/297  soft-tissue]
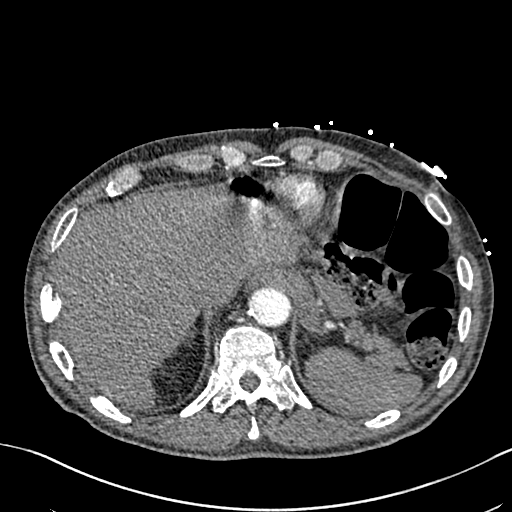
[im 52/297  lung]
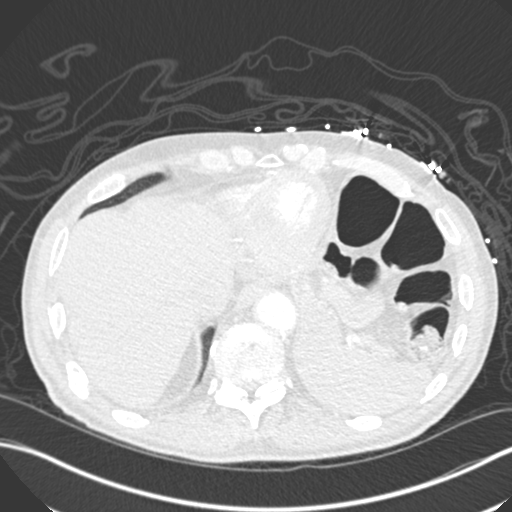
[im 65/297  soft-tissue]
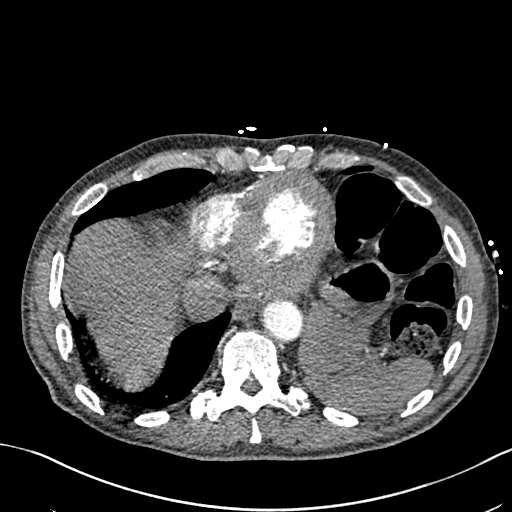
[im 91/297  lung]
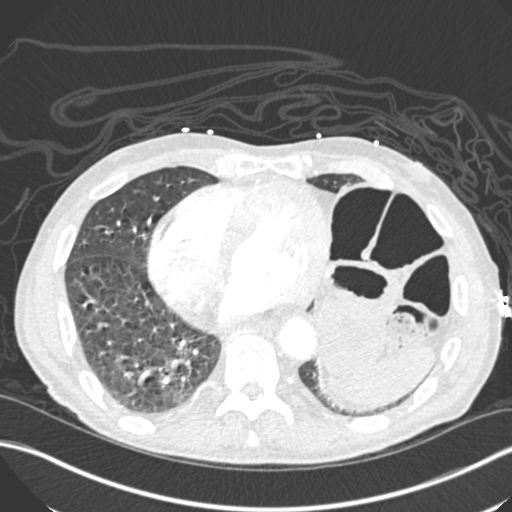
[im 103/297  soft-tissue]
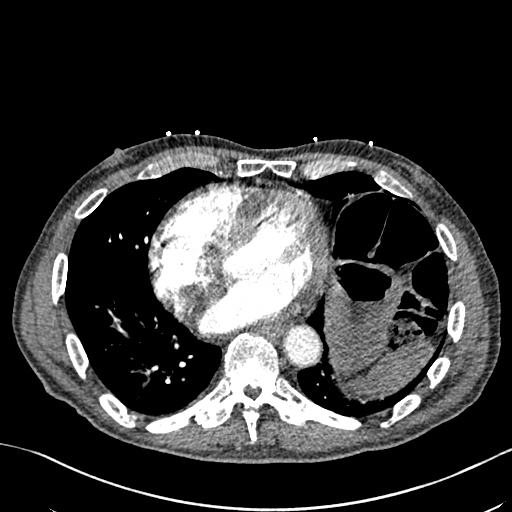
[im 116/297  lung]
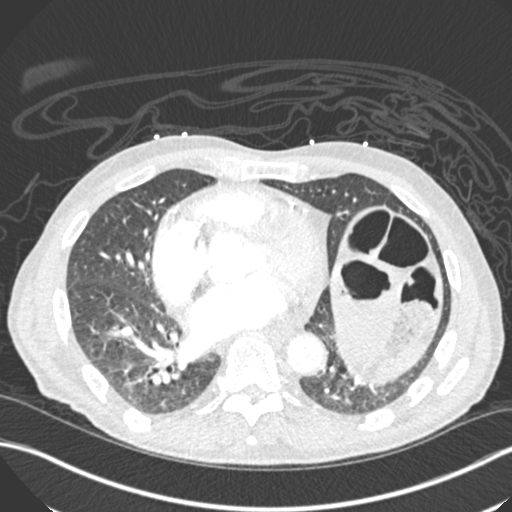
[im 142/297  soft-tissue]
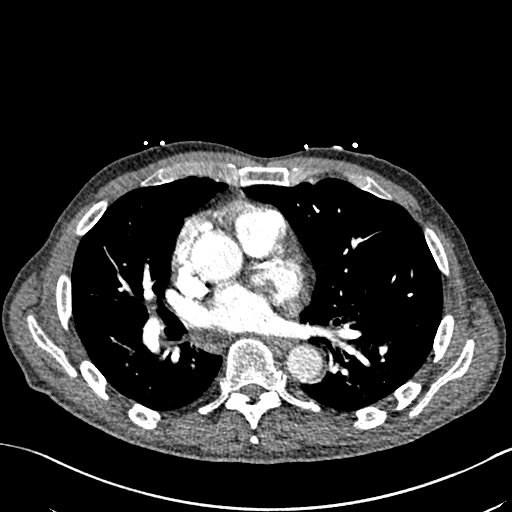
[im 155/297  lung]
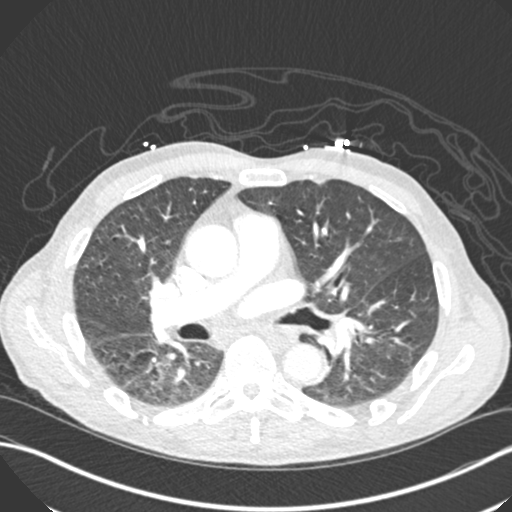
[im 181/297  soft-tissue]
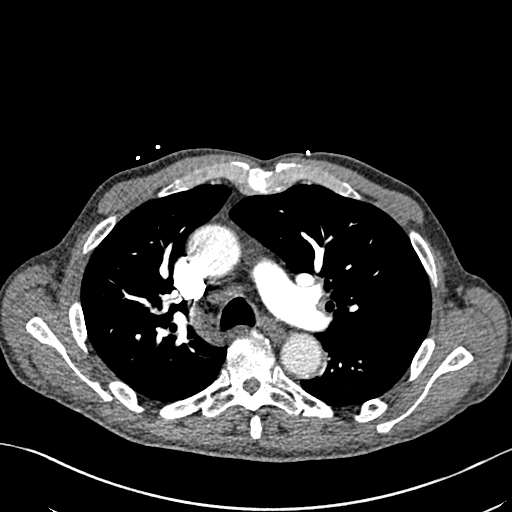
[im 194/297  lung]
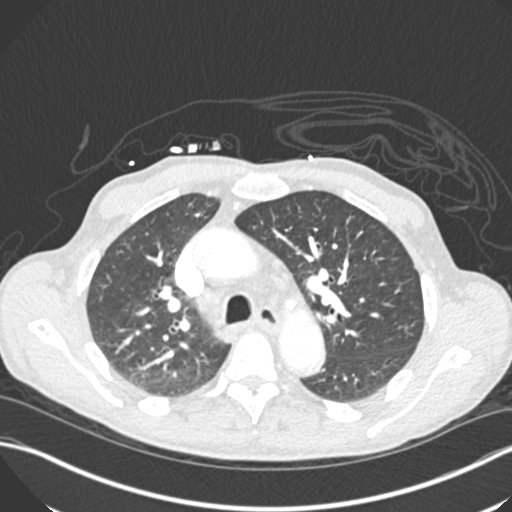
[im 206/297  soft-tissue]
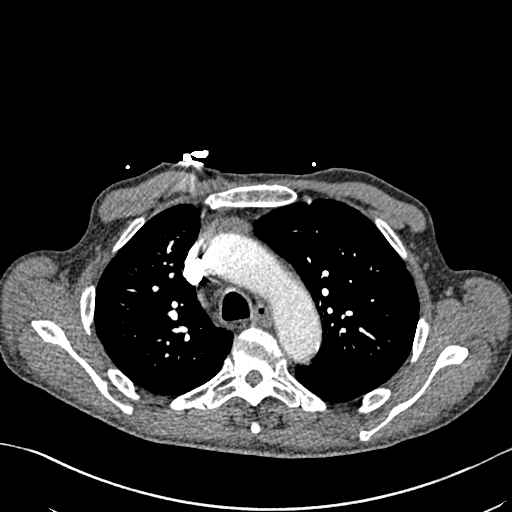
[im 232/297  lung]
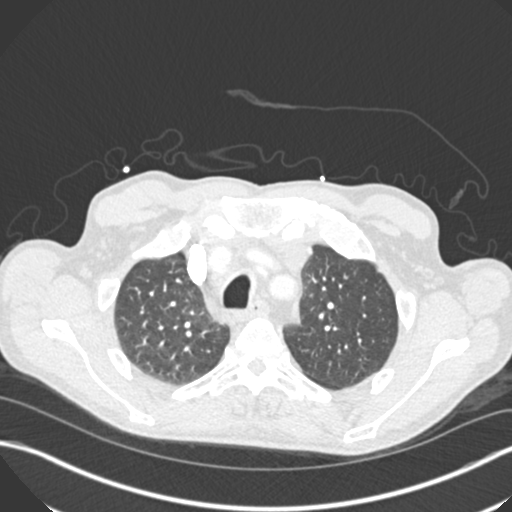
[im 245/297  soft-tissue]
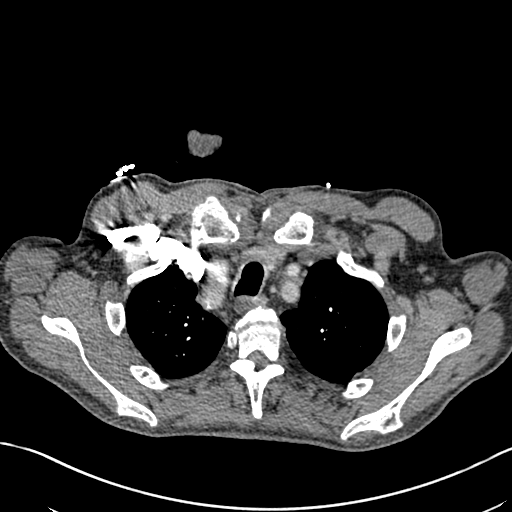
[im 258/297  lung]
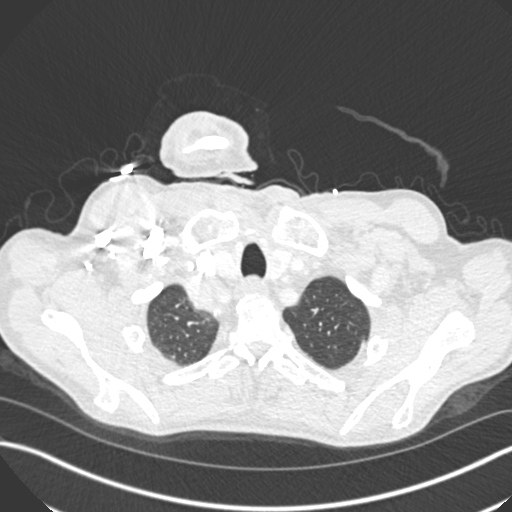
[im 284/297  soft-tissue]
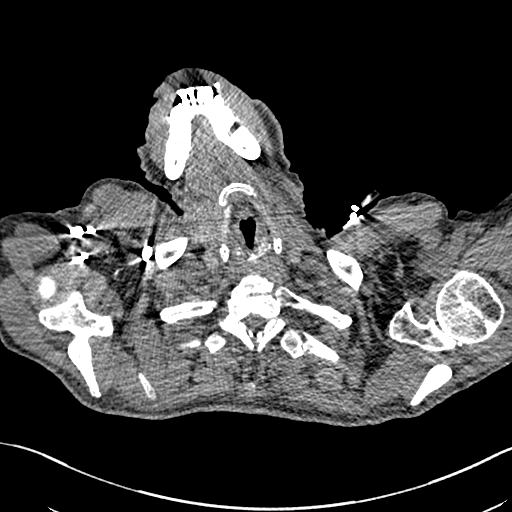

[Series 7: coronal mpr · coronal · 0.58mm/px · 2 of 83 slices shown]
[im 28/83  soft-tissue]
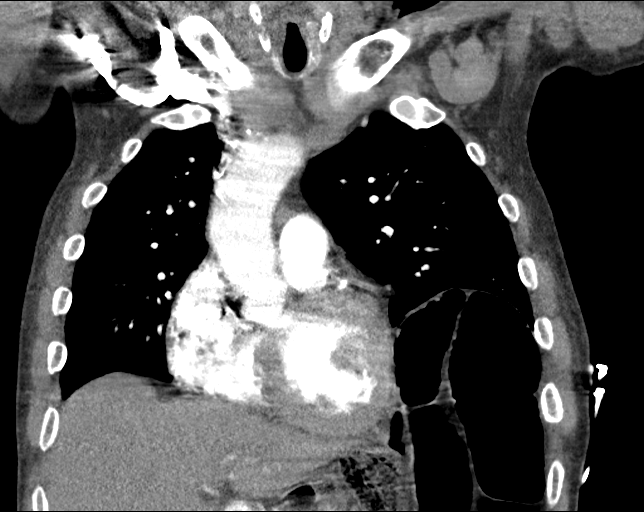
[im 55/83  soft-tissue]
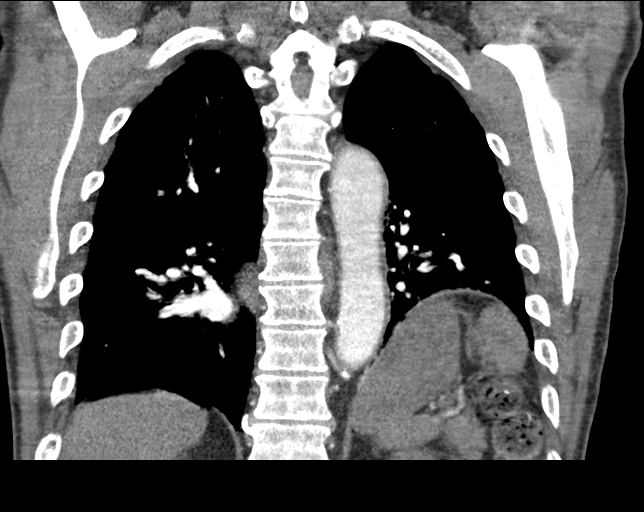

[18 of 46 positions shown; findings below may reference images not displayed]

FINDINGS: Cardiovascular: Contrast injection is sufficient to demonstrate
satisfactory opacification of the pulmonary arteries to the
segmental level. There is no pulmonary embolus. The main pulmonary
artery is within normal limits for size. There is no CT evidence of
acute right heart strain. There are atherosclerotic changes of the
visualized aorta without evidence for an aneurysm or dissection.
Heart size is borderline enlarged. Coronary artery calcifications
are noted.

Mediastinum/Nodes:

--No mediastinal or hilar lymphadenopathy.

--No axillary lymphadenopathy.

--No supraclavicular lymphadenopathy.

--there is a small left-sided thyroid nodule, for which no further
follow-up is required.

--The esophagus is unremarkable

Lungs/Pleura: There is a moderate amount of debris within the right
mainstem bronchus. There is no pneumothorax. No large pleural
effusion. No focal infiltrate. There is elevation of the left
hemidiaphragm with atelectasis at the left lung base.

Upper Abdomen: There are probable nonobstructing stones in the upper
pole the left kidney. There is a cyst arising from the upper pole of
the right kidney.

Musculoskeletal: No chest wall abnormality. No acute or significant
osseous findings. There are old healed right-sided rib fractures.

Review of the MIP images confirms the above findings.
IMPRESSION: 1. No CT evidence for acute pulmonary embolus.
2. Moderate amount of debris within the right mainstem bronchus,
which may indicate aspiration.
3. Elevation of the left hemidiaphragm with atelectasis at the left
lung base. No focal infiltrate.
4. Aortic Atherosclerosis ([id number]-RJA.A).

## 2021-05-22 IMAGING — CR DG CHEST 2V
1 series · 2 of 2 positions shown · non-contrast
Comparison: November 20, 2016.

CLINICAL DATA: Tachycardia

EXAM:
CHEST - 2 VIEW

[Series 1: dg chest 2 view · 0.14mm/px · 2 of 2 slices shown]
[im 1/2]
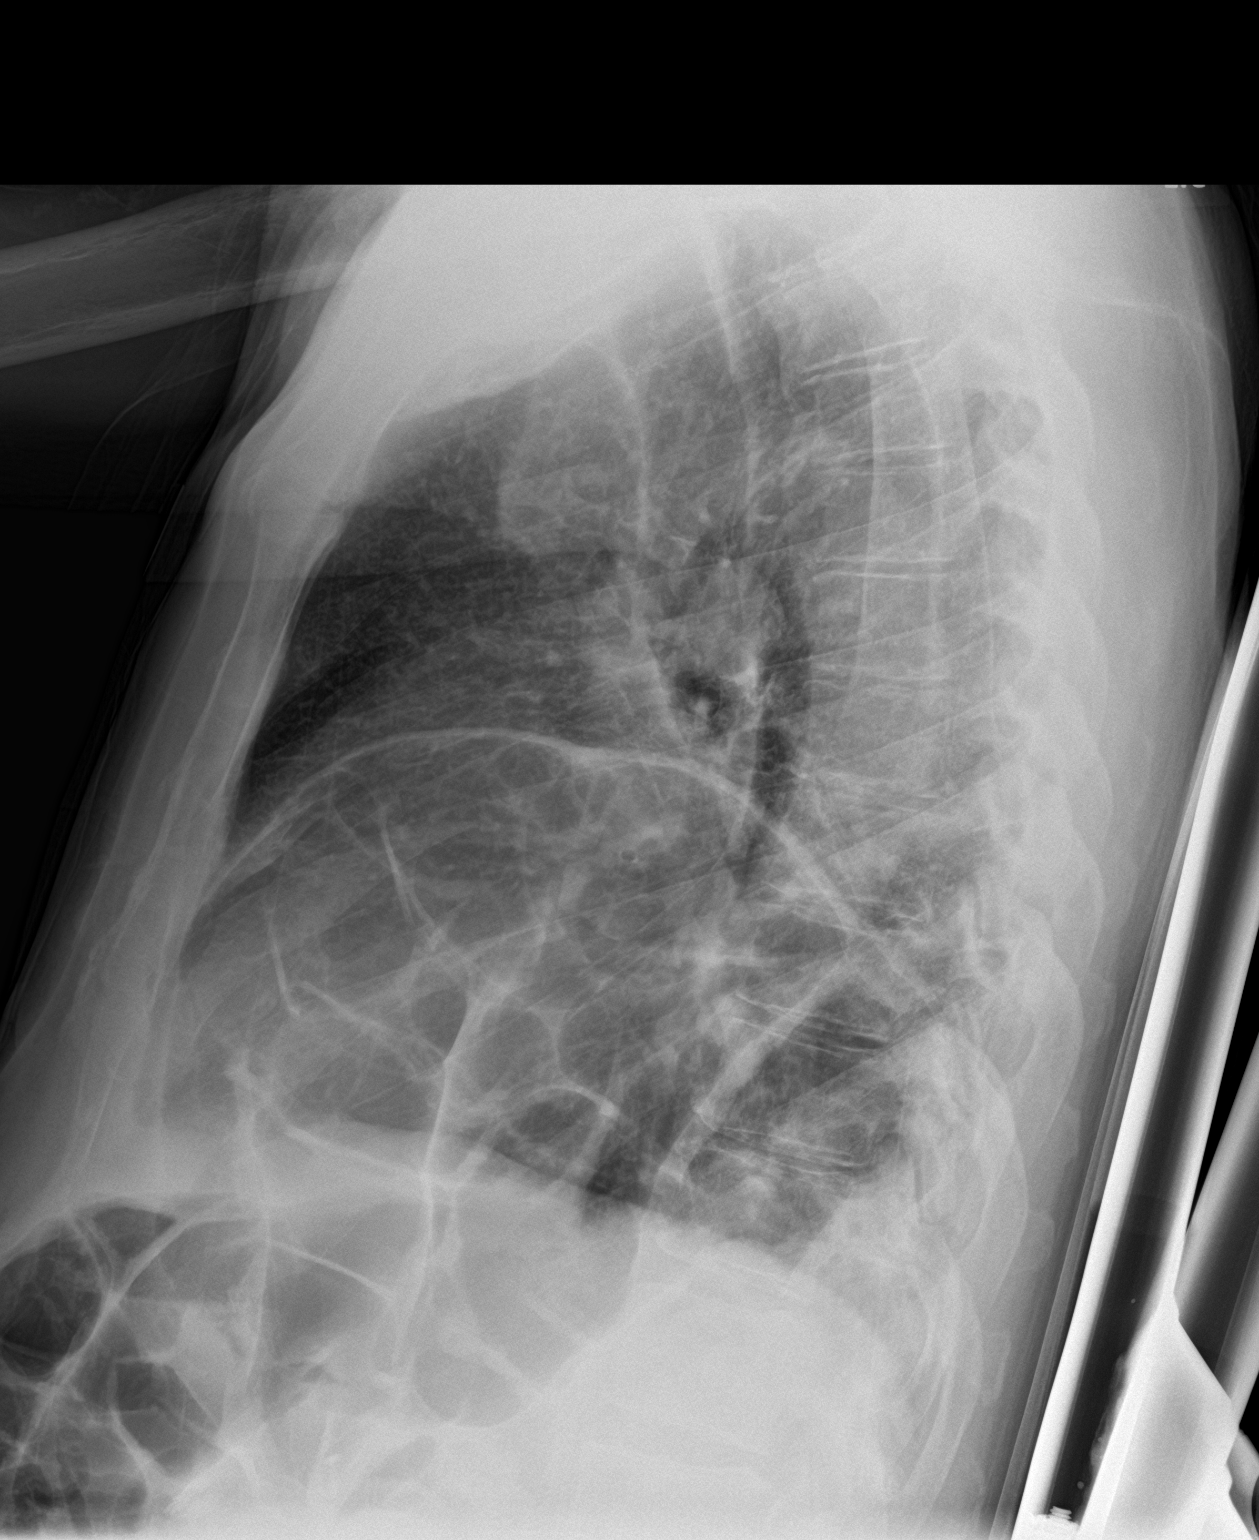
[im 2/2]
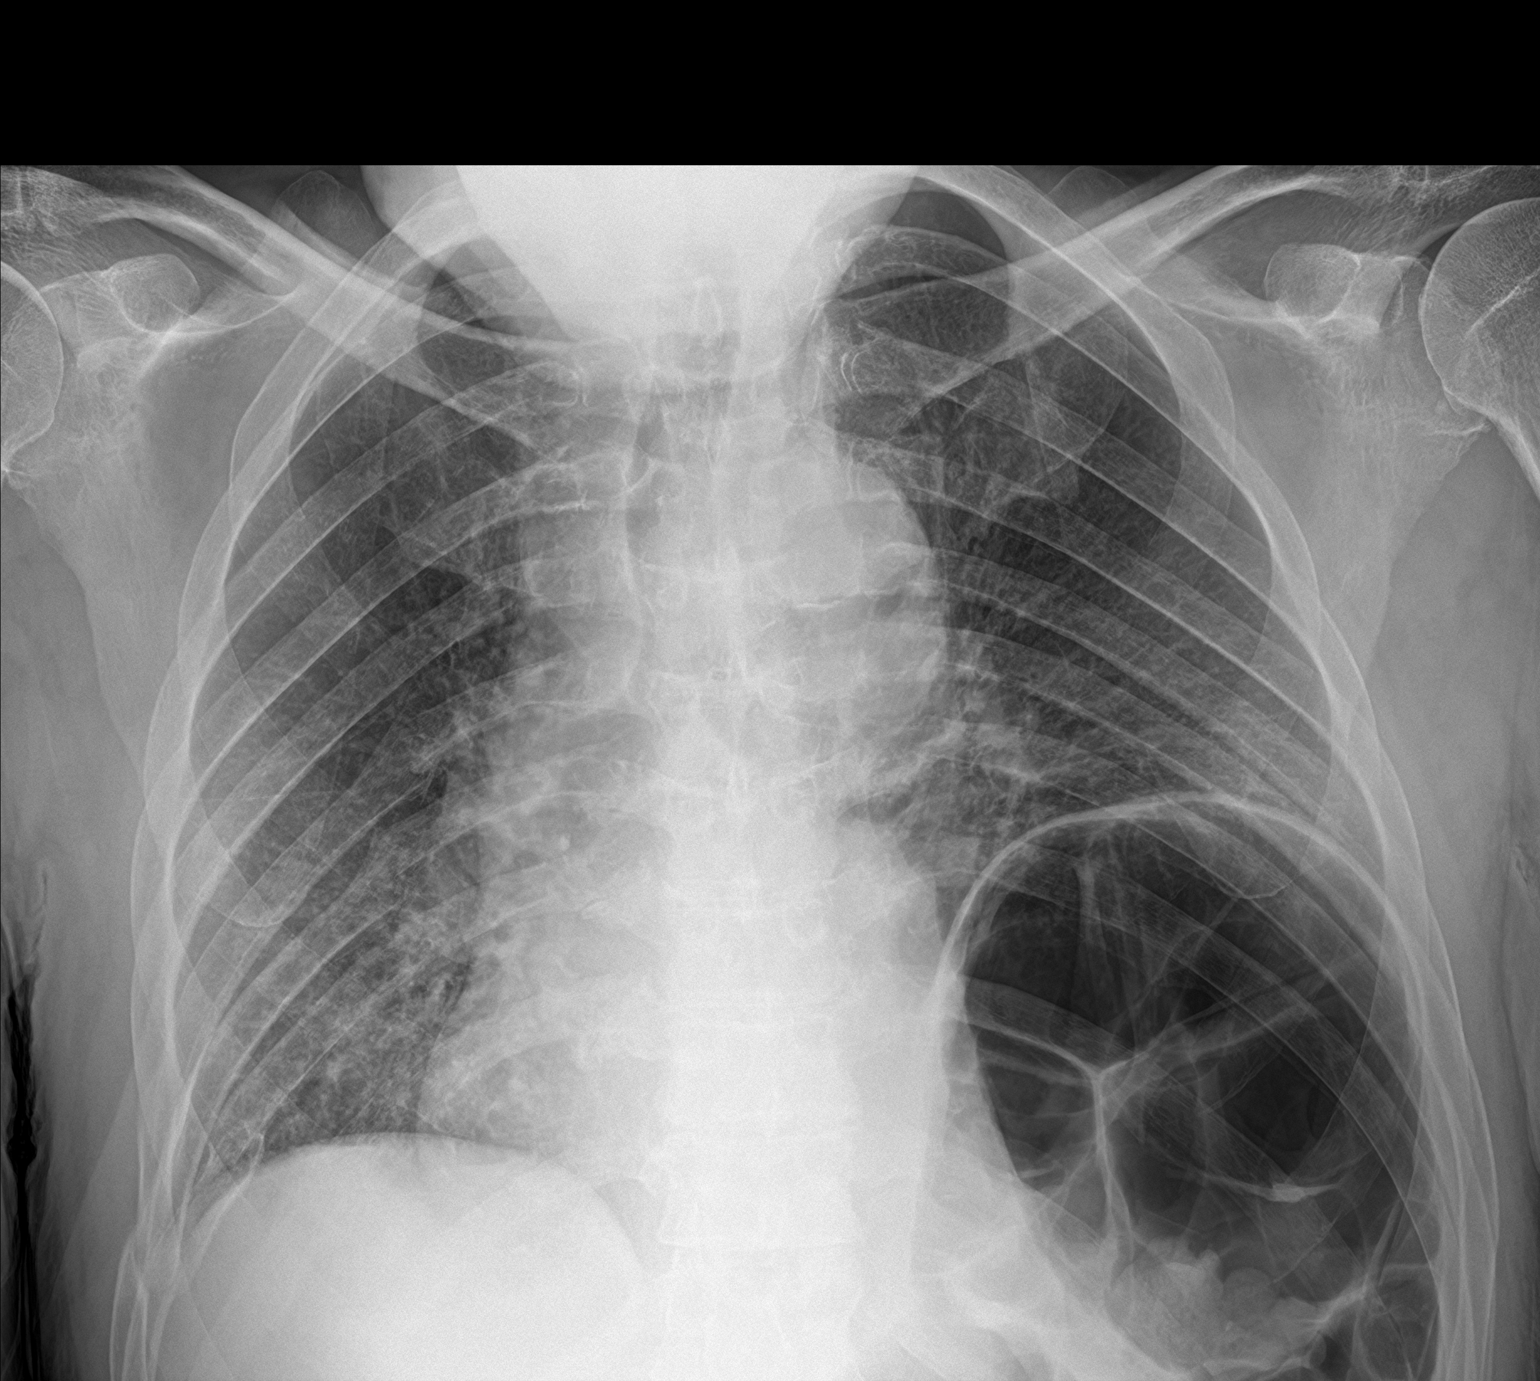

[2 of 2 positions shown; findings below may reference images not displayed]

FINDINGS: There is ill-defined opacity in the lung bases without
consolidation. Heart size and pulmonary vascularity are normal. No
adenopathy. There is elevation of the left hemidiaphragm. Visualized
bowel appears mildly dilated without appreciable air-fluid levels.
No free air. There are old healed rib fractures on the right.
IMPRESSION: 1. Ill-defined opacity in each lung base, concerning for atypical
organism pneumonia. No consolidation. Lungs elsewhere clear.

2. Elevation of left hemidiaphragm is stable. Visualized bowel
appear somewhat dilated. Question a degree of underlying ileus.

3.  Heart size normal.

4.  No evident adenopathy.

## 2021-05-23 IMAGING — CR DG ABDOMEN 1V
2 series · 2 of 2 positions shown · non-contrast
Comparison: 05/07/2019

CLINICAL DATA: Impacted stool

EXAM:
ABDOMEN - 1 VIEW

[abdomen kub (1 of 2)]
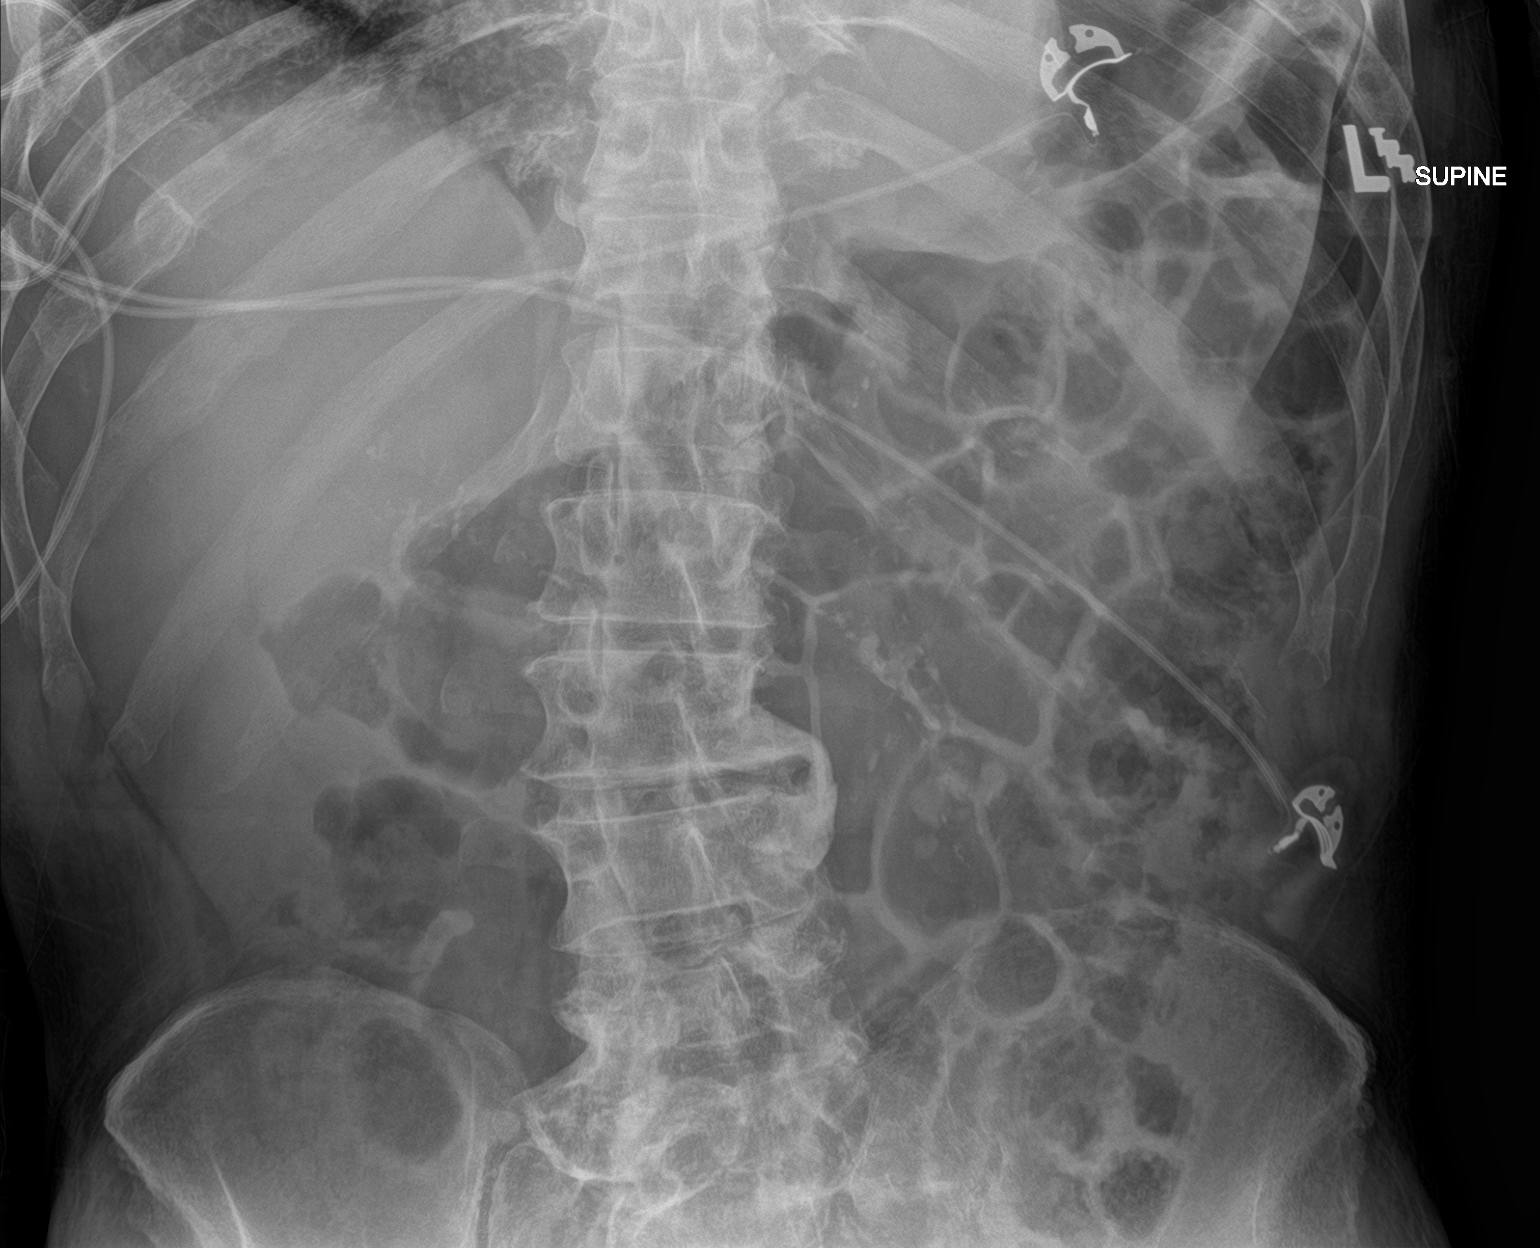

[abdomen kub (2 of 2)]
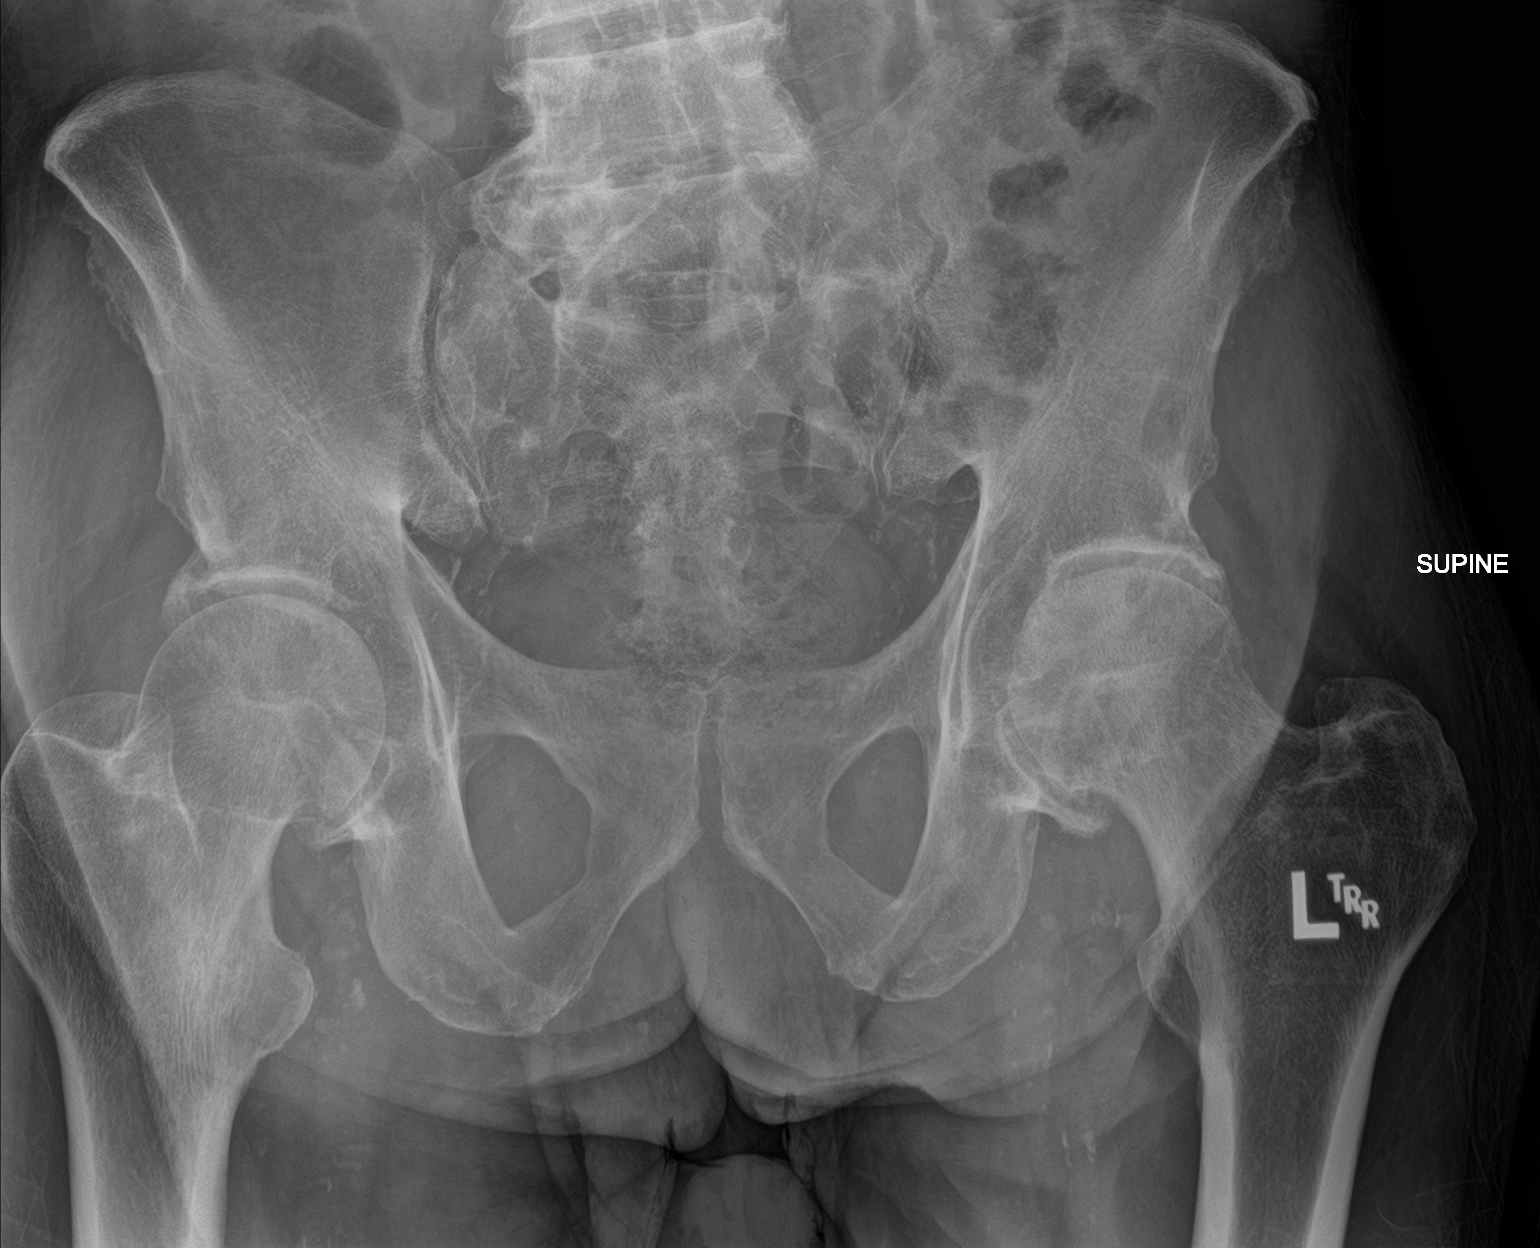

[2 of 2 positions shown; findings below may reference images not displayed]

FINDINGS: Mild diffuse increased bowel gas throughout. No definitive
obstructive pattern. Mild stool at the rectum. Vascular
calcifications
IMPRESSION: Overall nonobstructed gas pattern. Small amount of retained stool at
the rectum

## 2022-06-29 ENCOUNTER — Emergency Department
Admission: EM | Admit: 2022-06-29 | Discharge: 2022-06-29 | Discharge status: Against Medical Advice | Payer: 59 | Attending: Emergency Medicine | Admitting: Emergency Medicine

## 2022-06-29 ENCOUNTER — Other Ambulatory Visit: Payer: Self-pay

## 2022-06-29 DIAGNOSIS — Z5321 Procedure and treatment not carried out due to patient leaving prior to being seen by health care provider: Secondary | ICD-10-CM | POA: Insufficient documentation

## 2022-06-29 DIAGNOSIS — R531 Weakness: Secondary | ICD-10-CM | POA: Insufficient documentation

## 2022-06-29 DIAGNOSIS — R0602 Shortness of breath: Secondary | ICD-10-CM | POA: Insufficient documentation

## 2022-06-29 LAB — HEPATIC FUNCTION PANEL
ALT: 17 U/L (ref 0–44)
AST: 22 U/L (ref 15–41)
Albumin: 3.7 g/dL (ref 3.5–5.0)
Alkaline Phosphatase: 73 U/L (ref 38–126)
Bilirubin, Direct: <0.1 mg/dL (ref 0.0–0.2)
Total Bilirubin: 0.9 mg/dL (ref 0.3–1.2)
Total Protein: 7.7 g/dL (ref 6.5–8.1)

## 2022-06-29 LAB — BASIC METABOLIC PANEL
Anion gap: 8 (ref 5–15)
BUN: 19 mg/dL (ref 8–23)
CO2: 28 mmol/L (ref 22–32)
Calcium: 8.8 mg/dL — ABNORMAL LOW (ref 8.9–10.3)
Chloride: 106 mmol/L (ref 98–111)
Creatinine, Ser: 0.78 mg/dL (ref 0.61–1.24)
GFR, Estimated: >60 mL/min (ref >60)
Glucose, Bld: 83 mg/dL (ref 70–99)
Potassium: 3.8 mmol/L (ref 3.5–5.1)
Sodium: 142 mmol/L (ref 135–145)

## 2022-06-29 LAB — CBC
HCT: 37.1 % — ABNORMAL LOW (ref 39.0–52.0)
Hemoglobin: 11.4 g/dL — ABNORMAL LOW (ref 13.0–17.0)
MCH: 25.7 pg — ABNORMAL LOW (ref 26.0–34.0)
MCHC: 30.7 g/dL (ref 30.0–36.0)
MCV: 83.7 fL (ref 80.0–100.0)
Platelets: 1210 10*3/uL (ref 150–400)
RBC: 4.43 MIL/uL (ref 4.22–5.81)
RDW: 17.7 % — ABNORMAL HIGH (ref 11.5–15.5)
WBC: 9.6 10*3/uL (ref 4.0–10.5)
nRBC: 0 % (ref 0.0–0.2)

## 2022-06-29 NOTE — ED Notes (Signed)
See first nurse note: Pt is AMS and is not a good historian at this time due to AMS, this RN unsure of baseline. Pt does appear to have dried blood on his mouth but not active bleeding noted at this time.

## 2022-06-29 NOTE — ED Notes (Addendum)
Critical Result: Platelets 1210  Sidney Ace, MD made aware. No new orders at this time.

## 2022-06-29 NOTE — ED Notes (Addendum)
First Nurse Note: Patient to ED via [facility] from [facility] [place] for AMS. Patient was found with some blood in her mouth. Patient also having generalized weakness.

## 2022-07-11 ENCOUNTER — Emergency Department
Admission: EM | Admit: 2022-07-11 | Discharge: 2022-07-11 | Discharge status: Against Medical Advice | Payer: 59 | Attending: Physician Assistant | Admitting: Physician Assistant

## 2022-07-11 DIAGNOSIS — R042 Hemoptysis: Secondary | ICD-10-CM | POA: Diagnosis present

## 2022-07-11 DIAGNOSIS — Z5321 Procedure and treatment not carried out due to patient leaving prior to being seen by health care provider: Secondary | ICD-10-CM | POA: Insufficient documentation

## 2022-07-11 LAB — BASIC METABOLIC PANEL
Anion gap: 9 (ref 5–15)
BUN: 26 mg/dL — ABNORMAL HIGH (ref 8–23)
CO2: 27 mmol/L (ref 22–32)
Calcium: 8.9 mg/dL (ref 8.9–10.3)
Chloride: 104 mmol/L (ref 98–111)
Creatinine, Ser: 0.88 mg/dL (ref 0.61–1.24)
GFR, Estimated: >60 mL/min (ref >60)
Glucose, Bld: 76 mg/dL (ref 70–99)
Potassium: 4.2 mmol/L (ref 3.5–5.1)
Sodium: 140 mmol/L (ref 135–145)

## 2022-07-11 LAB — CBC
HCT: 37 % — ABNORMAL LOW (ref 39.0–52.0)
Hemoglobin: 11.3 g/dL — ABNORMAL LOW (ref 13.0–17.0)
MCH: 26.2 pg (ref 26.0–34.0)
MCHC: 30.5 g/dL (ref 30.0–36.0)
MCV: 85.8 fL (ref 80.0–100.0)
Platelets: 1098 10*3/uL (ref 150–400)
RBC: 4.31 MIL/uL (ref 4.22–5.81)
RDW: 18 % — ABNORMAL HIGH (ref 11.5–15.5)
WBC: 10.7 10*3/uL — ABNORMAL HIGH (ref 4.0–10.5)
nRBC: 0 % (ref 0.0–0.2)

## 2022-07-11 NOTE — ED Notes (Signed)
Pts niece is refusing to have pt seen here d/t volume in waiting area. Taking him to [place].

## 2022-07-11 NOTE — ED Triage Notes (Signed)
Pt arrived via EMS from the [place] [facility] [address] Per EMS, pt has been spitting up blood onto a washcloth. There are no evidence of blood clots, just blood in the pt saliva.

## 2022-07-12 ENCOUNTER — Inpatient Hospital Stay: Payer: 59

## 2022-07-12 ENCOUNTER — Inpatient Hospital Stay: Payer: 59 | Admitting: Oncology

## 2022-07-22 ENCOUNTER — Inpatient Hospital Stay: Payer: 59 | Attending: Oncology | Admitting: Oncology

## 2022-07-22 ENCOUNTER — Inpatient Hospital Stay: Payer: 59

## 2022-07-22 ENCOUNTER — Encounter: Payer: Self-pay | Admitting: Oncology

## 2022-07-22 ENCOUNTER — Other Ambulatory Visit: Payer: Self-pay | Admitting: *Deleted

## 2022-07-22 VITALS — BP 102/55 | HR 65 | Temp 95.3°F | Resp 18 | Ht 72.0 in | Wt 169.0 lb

## 2022-07-22 DIAGNOSIS — D696 Thrombocytopenia, unspecified: Secondary | ICD-10-CM | POA: Insufficient documentation

## 2022-07-22 DIAGNOSIS — D75839 Thrombocytosis, unspecified: Secondary | ICD-10-CM

## 2022-07-22 DIAGNOSIS — D471 Chronic myeloproliferative disease: Secondary | ICD-10-CM | POA: Insufficient documentation

## 2022-07-22 DIAGNOSIS — F17210 Nicotine dependence, cigarettes, uncomplicated: Secondary | ICD-10-CM | POA: Diagnosis not present

## 2022-07-22 LAB — CBC WITH DIFFERENTIAL/PLATELET
Abs Immature Granulocytes: 0.03 10*3/uL (ref 0.00–0.07)
Basophils Absolute: 0.1 10*3/uL (ref 0.0–0.1)
Basophils Relative: 1 %
Eosinophils Absolute: 0.4 10*3/uL (ref 0.0–0.5)
Eosinophils Relative: 4 %
HCT: 33.7 % — ABNORMAL LOW (ref 39.0–52.0)
Hemoglobin: 10.9 g/dL — ABNORMAL LOW (ref 13.0–17.0)
Immature Granulocytes: 0 %
Lymphocytes Relative: 25 %
Lymphs Abs: 2.5 10*3/uL (ref 0.7–4.0)
MCH: 26.6 pg (ref 26.0–34.0)
MCHC: 32.3 g/dL (ref 30.0–36.0)
MCV: 82.2 fL (ref 80.0–100.0)
Monocytes Absolute: 0.8 10*3/uL (ref 0.1–1.0)
Monocytes Relative: 8 %
Neutro Abs: 6.3 10*3/uL (ref 1.7–7.7)
Neutrophils Relative %: 62 %
Platelets: 1452 10*3/uL (ref 150–400)
RBC: 4.1 MIL/uL — ABNORMAL LOW (ref 4.22–5.81)
RDW: 18.3 % — ABNORMAL HIGH (ref 11.5–15.5)
Smear Review: INCREASED
WBC: 10.2 10*3/uL (ref 4.0–10.5)
nRBC: 0 % (ref 0.0–0.2)

## 2022-07-22 LAB — PATHOLOGIST SMEAR REVIEW

## 2022-07-22 MED ORDER — HYDROXYUREA 500 MG PO CAPS: 500.0000 mg | ORAL_CAPSULE | Freq: Every day | ORAL | 3 refills | Status: DC

## 2022-07-22 NOTE — Progress Notes (Unsigned)
Hematology/Oncology Consult note [facility] [phone] [phone]  Patient Care Team: Darlin Priestly, MD as PCP - General (Internal Medicine)   Name of the patient: Marc Smith  [id number]  07-24-1945    Reason for referral- ***   Referring physician- ***  Date of visit: 07/22/22   History of presenting illness- ***  ECOG PS- ***  Pain scale- ***   Review of systems- ROS  No Known Allergies  Patient Active Problem List   Diagnosis Date Noted   Stool incontinence 05/12/2019   Urinary incontinence 05/12/2019   Age-related physical debility 05/12/2019   Hyperlipidemia 05/12/2019   Acute pain of right lower extremity 05/08/2019   Tachycardia 05/07/2019   Aspiration pneumonia ([facility])    Elevated troponin I level    Thrombocytosis    Leukocytosis    Community acquired pneumonia    UTI (urinary tract infection) 11/20/2016   Acute lower UTI 11/20/2016   Gout attack 11/20/2016   Essential hypertension 11/20/2016     Past Medical History:  Diagnosis Date   Depression    Gout    Per Patient's nephew   Hyperlipidemia    Patient's nephew   Hypertension      History reviewed. No pertinent surgical history.  Social History   Socioeconomic History   Marital status: Single    Spouse name: Not on file   Number of children: Not on file   Years of education: Not on file   Highest education level: Not on file  Occupational History   Not on file  Tobacco Use   Smoking status: Every Day    Packs/day: 1    Types: Cigarettes   Smokeless tobacco: Current  Substance and Sexual Activity   Alcohol use: No   Drug use: No   Sexual activity: Not on file  Other Topics Concern   Not on file  Social History Narrative   Not on file   Social Determinants of Health   Financial Resource Strain: Not on file  Food Insecurity: No Food Insecurity (07/22/2022)   Hunger Vital Sign    Worried About Running Out of Food in the Last  Year: Never true    Ran Out of Food in the Last Year: Never true  Transportation Needs: No Transportation Needs (07/22/2022)   PRAPARE - Administrator, Civil Service (Medical): No    Lack of Transportation (Non-Medical): No  Physical Activity: Not on file  Stress: Not on file  Social Connections: Not on file  Intimate Partner Violence: Not At Risk (07/22/2022)   Humiliation, Afraid, Rape, and Kick questionnaire    Fear of Current or Ex-Partner: No    Emotionally Abused: No    Physically Abused: No    Sexually Abused: No     History reviewed. No pertinent family history.   Current Outpatient Medications:    acetaminophen (TYLENOL) 325 MG tablet, Take 650 mg by mouth every 6 (six) hours as needed., Disp: , Rfl:    allopurinol (ZYLOPRIM) 100 MG tablet, Take 100 mg by mouth daily., Disp: , Rfl:    amLODipine (NORVASC) 10 MG tablet, Take 10 mg by mouth daily., Disp: , Rfl:    amoxicillin-clavulanate (AUGMENTIN) 875-125 MG tablet, Take 1 tablet by mouth 2 (two) times daily., Disp: , Rfl:    aspirin 81 MG chewable tablet, Chew 1 tablet (81 mg total) by mouth daily., Disp:  , Rfl:    atorvastatin (LIPITOR) 40 MG tablet, Take 40 mg by  mouth daily., Disp: , Rfl:    carvedilol (COREG) 25 MG tablet, Take 25 mg by mouth 2 (two) times daily with a meal., Disp: , Rfl:    feeding supplement, ENSURE ENLIVE, (ENSURE ENLIVE) LIQD, Take 237 mLs by mouth 2 (two) times daily between meals., Disp: 237 mL, Rfl: 12   guaifenesin (ROBITUSSIN) 100 MG/5ML syrup, Take 200 mg by mouth 3 (three) times daily as needed for cough., Disp: , Rfl:    lisinopril (ZESTRIL) 40 MG tablet, Hold until outpatient followup due to low normal BP in the hospital., Disp: , Rfl:    loperamide (IMODIUM) 2 MG capsule, Take by mouth as needed for diarrhea or loose stools., Disp: , Rfl:    magnesium hydroxide (MILK OF MAGNESIA) 400 MG/5ML suspension, Take by mouth daily as needed for mild constipation., Disp: , Rfl:     sertraline (ZOLOFT) 50 MG tablet, Take 50 mg by mouth daily., Disp: , Rfl:    Vitamin D, Ergocalciferol, 50000 units CAPS, Take 1 capsule by mouth once a week., Disp: , Rfl:    Physical exam:  Vitals:   07/22/22 1334  BP: (!) 102/55  Pulse: 65  Resp: 18  Temp: (!) 95.3 F (35.2 C)  TempSrc: Tympanic  SpO2: 100%  Weight: 169 lb (76.7 kg)  Height: 6' (1.829 m)   Physical Exam        Latest Ref Rng & Units 07/11/2022    2:42 PM  CMP  Glucose 70 - 99 mg/dL 76   BUN 8 - 23 mg/dL 26   Creatinine 4.09 - 1.24 mg/dL 8.11   Sodium 914 - 782 mmol/L 140   Potassium 3.5 - 5.1 mmol/L 4.2   Chloride 98 - 111 mmol/L 104   CO2 22 - 32 mmol/L 27   Calcium 8.9 - 10.3 mg/dL 8.9       Latest Ref Rng & Units 07/11/2022    2:42 PM  CBC  WBC 4.0 - 10.5 K/uL 10.7   Hemoglobin 13.0 - 17.0 g/dL 95.6   Hematocrit 21.3 - 52.0 % 37.0   Platelets 150 - 400 K/uL 1,098     No images are attached to the encounter.  No results found.  Assessment and plan- Patient is a 77 y.o. male ***   Thank you for this kind referral and the opportunity to participate in the care of this  Patient   Visit Diagnosis No diagnosis found.  Dr. Owens Shark, MD, MPH [facility] [phone] 07/22/2022

## 2022-07-23 LAB — VON WILLEBRAND PANEL: Ristocetin Co-factor, Plasma: 60 % (ref 50–200)

## 2022-07-24 NOTE — Progress Notes (Signed)
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 Cathie Hoops back in April 2021.  He was found to have thrombocytosis with a platelet count that has been gradually trending up from the 700s in 2021 presently to the 1000's.  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  Bone marrow biopsy was recommended but the patient never followed up and was never subsequently on Hydrea.  He is now a resident of nursing home and is here to reestablish follow-up for thrombocytosis.  In the interim it appears that the patient may have an episode of TIA but is not entirely clear.  No other episodes of DVT PE or strokes.  Patients caregiver reports that he occasionally coughs up blood  Interval history- Patients caregiver reports that he occasionally coughs up blood.  Patient is sitting in a wheelchair and is overall a poor historian  ECOG PS- 2 Pain scale- 0   Review of systems- Review of Systems  Constitutional:  Negative for chills, fever, malaise/fatigue and weight loss.  HENT:  Negative for congestion, ear discharge and nosebleeds.   Eyes:  Negative for blurred vision.  Respiratory:  Negative for cough, hemoptysis, sputum production, shortness of breath and wheezing.   Cardiovascular:  Negative for chest pain, palpitations, orthopnea and claudication.  Gastrointestinal:  Negative for abdominal pain, blood in stool, constipation, diarrhea, heartburn, melena, nausea and vomiting.  Genitourinary:  Negative for  dysuria, flank pain, frequency, hematuria and urgency.  Musculoskeletal:  Negative for back pain, joint pain and myalgias.  Skin:  Negative for rash.  Neurological:  Negative for dizziness, tingling, focal weakness, seizures, weakness and headaches.  Endo/Heme/Allergies:  Does not bruise/bleed easily.  Psychiatric/Behavioral:  Negative for depression and suicidal ideas. The patient does not have insomnia.       No Known Allergies   Past Medical History:  Diagnosis Date   Depression    Gout    Per Patient's nephew   Hyperlipidemia    Patient's nephew   Hypertension      History reviewed. No pertinent surgical history.  Social History   Socioeconomic History   Marital status: Single    Spouse name: Not on file   Number of children: Not on file   Years of education: Not on file   Highest education level: Not on file  Occupational History   Not on file  Tobacco Use   Smoking status: Every Day    Packs/day: 1    Types: Cigarettes   Smokeless tobacco: Current  Substance and Sexual Activity   Alcohol use: No   Drug use: No   Sexual activity: Not on file  Other Topics Concern   Not on file  Social History Narrative   Not on file   Social Determinants of Health   Financial Resource Strain: Not on file  Food Insecurity: No Food Insecurity (07/22/2022)   Hunger Vital Sign    Worried About Running Out of Food in the Last Year: Never true    Ran Out of Food in the Last Year: Never true  Transportation Needs: No  Transportation Needs (07/22/2022)   PRAPARE - Administrator, Civil Service (Medical): No    Lack of Transportation (Non-Medical): No  Physical Activity: Not on file  Stress: Not on file  Social Connections: Not on file  Intimate Partner Violence: Not At Risk (07/22/2022)   Humiliation, Afraid, Rape, and Kick questionnaire    Fear of Current or Ex-Partner: No    Emotionally Abused: No    Physically Abused: No    Sexually Abused: No    History  reviewed. No pertinent family history.   Current Outpatient Medications:    acetaminophen (TYLENOL) 325 MG tablet, Take 650 mg by mouth every 6 (six) hours as needed., Disp: , Rfl:    allopurinol (ZYLOPRIM) 100 MG tablet, Take 100 mg by mouth daily., Disp: , Rfl:    amLODipine (NORVASC) 10 MG tablet, Take 10 mg by mouth daily., Disp: , Rfl:    amoxicillin-clavulanate (AUGMENTIN) 875-125 MG tablet, Take 1 tablet by mouth 2 (two) times daily., Disp: , Rfl:    aspirin 81 MG chewable tablet, Chew 1 tablet (81 mg total) by mouth daily., Disp:  , Rfl:    atorvastatin (LIPITOR) 40 MG tablet, Take 40 mg by mouth daily., Disp: , Rfl:    carvedilol (COREG) 25 MG tablet, Take 25 mg by mouth 2 (two) times daily with a meal., Disp: , Rfl:    feeding supplement, ENSURE ENLIVE, (ENSURE ENLIVE) LIQD, Take 237 mLs by mouth 2 (two) times daily between meals., Disp: 237 mL, Rfl: 12   guaifenesin (ROBITUSSIN) 100 MG/5ML syrup, Take 200 mg by mouth 3 (three) times daily as needed for cough., Disp: , Rfl:    lisinopril (ZESTRIL) 40 MG tablet, Hold until outpatient followup due to low normal BP in the hospital., Disp: , Rfl:    loperamide (IMODIUM) 2 MG capsule, Take by mouth as needed for diarrhea or loose stools., Disp: , Rfl:    magnesium hydroxide (MILK OF MAGNESIA) 400 MG/5ML suspension, Take by mouth daily as needed for mild constipation., Disp: , Rfl:    sertraline (ZOLOFT) 50 MG tablet, Take 50 mg by mouth daily., Disp: , Rfl:    Vitamin D, Ergocalciferol, 50000 units CAPS, Take 1 capsule by mouth once a week., Disp: , Rfl:    hydroxyurea (HYDREA) 500 MG capsule, Take 1 capsule (500 mg total) by mouth daily. May take with food to minimize GI side effects., Disp: 30 capsule, Rfl: 3  Physical exam:  Vitals:   07/22/22 1334  BP: (!) 102/55  Pulse: 65  Resp: 18  Temp: (!) 95.3 F (35.2 C)  TempSrc: Tympanic  SpO2: 100%  Weight: 169 lb (76.7 kg)  Height: 6' (1.829 m)   Physical Exam Cardiovascular:      Rate and Rhythm: Normal rate and regular rhythm.     Heart sounds: Normal heart sounds.  Pulmonary:     Effort: Pulmonary effort is normal.     Breath sounds: Normal breath sounds.  Abdominal:     General: Bowel sounds are normal.     Palpations: Abdomen is soft.     Comments: No palpable splenomegaly  Skin:    General: Skin is warm and dry.  Neurological:     Mental Status: He is alert and oriented to person, place, and time.         Latest Ref Rng & Units 07/11/2022    2:42 PM  CMP  Glucose 70 - 99 mg/dL 76   BUN 8 - 23  mg/dL 26   Creatinine 1.61 - 1.24 mg/dL 0.96   Sodium 045 - 409 mmol/L 140   Potassium 3.5 - 5.1 mmol/L 4.2   Chloride 98 - 111 mmol/L 104   CO2 22 - 32 mmol/L 27   Calcium 8.9 - 10.3 mg/dL 8.9       Latest Ref Rng & Units 07/22/2022    2:23 PM  CBC  WBC 4.0 - 10.5 K/uL 10.2   Hemoglobin 13.0 - 17.0 g/dL 81.1   Hematocrit 91.4 - 52.0 % 33.7   Platelets 150 - 400 K/uL 1,452      Assessment and plan- Patient is a 77 y.o. male referred for thrombocytosis JAK2 positive  JAK2 positive thrombocytosis is secondary to myeloproliferative disorder.  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  Ideally patient needs a bone marrow biopsy to distinguish between ET and primary myelofibrosis.  However given patient's overall performance status and the fact that he is from nursing home and doing poorly overall I would like to forego bone marrow biopsy at this time.  I will treat him empirically as essential thrombocytosis and start him on Hydrea 500 mg daily.  Will repeat CBC with differential in 2 weeks, 1 month and 2 months and see him back in 2 months.  I am also checking a von Willebrand panel at this time and if it is normal I will him resume his low-dose aspirin.  Paradoxically high platelet counts can lead to bleeding issues.  Hemoptysis: Reevaluate in 2  months and if symptoms persist or worsen I will also get a CT chest at that time  Discussed natural history of essential thrombocytosis and risk of thromboembolic phenomena in patients more than 76 years of age.  Need to start cytoreductive agents to keep the platelets less than 400.  Discussed risks and benefits of Hydrea including all but not limited to dizziness, mouth sores and leg ulcers.  Patient's caregiver and his niece verbalized understanding of the plan.   Visit Diagnosis 1. Thrombocytopenia (HCC)   2. Thrombocytosis      Dr. Owens Shark, MD, MPH [facility] [phone] 07/24/2022 1:22 PM

## 2022-07-31 LAB — CALRETICULIN (CALR) MUTATION ANALYSIS

## 2022-07-31 LAB — VON WILLEBRAND PANEL: Von Willebrand Antigen, Plasma: 149 % (ref 50–200)

## 2022-08-01 ENCOUNTER — Telehealth: Payer: Self-pay | Admitting: *Deleted

## 2022-08-01 NOTE — Telephone Encounter (Signed)
Called patient's work number and spoke to his nephew.  Nephew states his uncle is at the [employer]. Spoke to [place] the med tech at [employer] and explained that Dr. Smith [name] would like to restart low dose aspirin if the patient was not having any bleeding.  Deanna Artis states he is doing fine and will inform the director to restart the aspirin per Dr. Assunta Gambles request.

## 2022-08-02 ENCOUNTER — Telehealth: Payer: Self-pay | Admitting: *Deleted

## 2022-08-02 NOTE — Telephone Encounter (Signed)
Dr. Smith [name] wants him to change the dose for the hydrea. Currently he is taking 500 mg  each day. She now wants to alternate 500 mg 1 day and the other day take 1000 mg. Alternating with the 2 doses each day. I got the fax number and sent the new plan for the hydrea fax transmission went through

## 2022-08-03 LAB — MPL MUTATION ANALYSIS

## 2022-08-03 LAB — VON WILLEBRAND PANEL: Coagulation Factor VIII: 116 % (ref 56–140)

## 2022-08-03 LAB — COAG STUDIES INTERP REPORT

## 2022-08-04 ENCOUNTER — Inpatient Hospital Stay: Payer: 59 | Attending: Oncology

## 2022-08-04 DIAGNOSIS — D75839 Thrombocytosis, unspecified: Secondary | ICD-10-CM | POA: Insufficient documentation

## 2022-08-04 LAB — CBC WITH DIFFERENTIAL/PLATELET
Abs Immature Granulocytes: 0.02 10*3/uL (ref 0.00–0.07)
Basophils Absolute: 0.1 10*3/uL (ref 0.0–0.1)
Basophils Relative: 1 %
Eosinophils Absolute: 0.3 10*3/uL (ref 0.0–0.5)
Eosinophils Relative: 5 %
HCT: 34.3 % — ABNORMAL LOW (ref 39.0–52.0)
Hemoglobin: 11 g/dL — ABNORMAL LOW (ref 13.0–17.0)
Immature Granulocytes: 0 %
Lymphocytes Relative: 20 %
Lymphs Abs: 1.4 10*3/uL (ref 0.7–4.0)
MCH: 26.7 pg (ref 26.0–34.0)
MCHC: 32.1 g/dL (ref 30.0–36.0)
MCV: 83.3 fL (ref 80.0–100.0)
Monocytes Absolute: 0.6 10*3/uL (ref 0.1–1.0)
Monocytes Relative: 9 %
Neutro Abs: 4.3 10*3/uL (ref 1.7–7.7)
Neutrophils Relative %: 65 %
Platelets: 961 10*3/uL (ref 150–400)
RBC: 4.12 MIL/uL — ABNORMAL LOW (ref 4.22–5.81)
RDW: 18.5 % — ABNORMAL HIGH (ref 11.5–15.5)
WBC: 6.7 10*3/uL (ref 4.0–10.5)
nRBC: 0 % (ref 0.0–0.2)

## 2022-08-05 ENCOUNTER — Other Ambulatory Visit: Payer: 59

## 2022-08-09 ENCOUNTER — Emergency Department: Payer: 59

## 2022-08-09 ENCOUNTER — Inpatient Hospital Stay
Admission: EM | Admit: 2022-08-09 | Discharge: 2022-08-24 | DRG: 871 | Disposition: A | Discharge status: Skilled Nursing Facility | Payer: 59 | Source: Skilled Nursing Facility | Attending: Internal Medicine | Admitting: Internal Medicine

## 2022-08-09 ENCOUNTER — Other Ambulatory Visit: Payer: Self-pay

## 2022-08-09 ENCOUNTER — Encounter: Payer: Self-pay | Admitting: Radiology

## 2022-08-09 DIAGNOSIS — F03918 Unspecified dementia, unspecified severity, with other behavioral disturbance: Secondary | ICD-10-CM | POA: Diagnosis present

## 2022-08-09 DIAGNOSIS — E785 Hyperlipidemia, unspecified: Secondary | ICD-10-CM | POA: Diagnosis present

## 2022-08-09 DIAGNOSIS — N390 Urinary tract infection, site not specified: Secondary | ICD-10-CM | POA: Diagnosis present

## 2022-08-09 DIAGNOSIS — E876 Hypokalemia: Secondary | ICD-10-CM | POA: Diagnosis present

## 2022-08-09 DIAGNOSIS — F03911 Unspecified dementia, unspecified severity, with agitation: Secondary | ICD-10-CM | POA: Diagnosis present

## 2022-08-09 DIAGNOSIS — Z1152 Encounter for screening for COVID-19: Secondary | ICD-10-CM | POA: Diagnosis not present

## 2022-08-09 DIAGNOSIS — I10 Essential (primary) hypertension: Secondary | ICD-10-CM | POA: Diagnosis present

## 2022-08-09 DIAGNOSIS — J690 Pneumonitis due to inhalation of food and vomit: Secondary | ICD-10-CM | POA: Diagnosis present

## 2022-08-09 DIAGNOSIS — E11649 Type 2 diabetes mellitus with hypoglycemia without coma: Secondary | ICD-10-CM | POA: Diagnosis not present

## 2022-08-09 DIAGNOSIS — Z7189 Other specified counseling: Secondary | ICD-10-CM | POA: Diagnosis not present

## 2022-08-09 DIAGNOSIS — D75839 Thrombocytosis, unspecified: Secondary | ICD-10-CM | POA: Diagnosis present

## 2022-08-09 DIAGNOSIS — Z515 Encounter for palliative care: Secondary | ICD-10-CM | POA: Diagnosis not present

## 2022-08-09 DIAGNOSIS — A419 Sepsis, unspecified organism: Secondary | ICD-10-CM | POA: Diagnosis present

## 2022-08-09 DIAGNOSIS — F17210 Nicotine dependence, cigarettes, uncomplicated: Secondary | ICD-10-CM | POA: Diagnosis present

## 2022-08-09 DIAGNOSIS — J9601 Acute respiratory failure with hypoxia: Secondary | ICD-10-CM | POA: Diagnosis present

## 2022-08-09 DIAGNOSIS — R6521 Severe sepsis with septic shock: Secondary | ICD-10-CM | POA: Diagnosis present

## 2022-08-09 DIAGNOSIS — R54 Age-related physical debility: Secondary | ICD-10-CM | POA: Diagnosis present

## 2022-08-09 DIAGNOSIS — E041 Nontoxic single thyroid nodule: Secondary | ICD-10-CM | POA: Diagnosis present

## 2022-08-09 DIAGNOSIS — I214 Non-ST elevation (NSTEMI) myocardial infarction: Secondary | ICD-10-CM

## 2022-08-09 DIAGNOSIS — Z66 Do not resuscitate: Secondary | ICD-10-CM | POA: Diagnosis present

## 2022-08-09 DIAGNOSIS — H9190 Unspecified hearing loss, unspecified ear: Secondary | ICD-10-CM | POA: Diagnosis present

## 2022-08-09 DIAGNOSIS — L8990 Pressure ulcer of unspecified site, unspecified stage: Secondary | ICD-10-CM | POA: Insufficient documentation

## 2022-08-09 DIAGNOSIS — F0390 Unspecified dementia without behavioral disturbance: Secondary | ICD-10-CM | POA: Insufficient documentation

## 2022-08-09 LAB — PROTIME-INR
INR: 1.3 — ABNORMAL HIGH (ref 0.8–1.2)
Prothrombin Time: 16 seconds — ABNORMAL HIGH (ref 11.4–15.2)

## 2022-08-09 LAB — MAGNESIUM: Magnesium: 1.9 mg/dL (ref 1.7–2.4)

## 2022-08-09 LAB — CBC WITH DIFFERENTIAL/PLATELET
Abs Immature Granulocytes: 0.07 10*3/uL (ref 0.00–0.07)
Basophils Absolute: 0 10*3/uL (ref 0.0–0.1)
Basophils Relative: 0 %
Eosinophils Absolute: 0 10*3/uL (ref 0.0–0.5)
Eosinophils Relative: 0 %
HCT: 31.9 % — ABNORMAL LOW (ref 39.0–52.0)
Hemoglobin: 10.7 g/dL — ABNORMAL LOW (ref 13.0–17.0)
Immature Granulocytes: 1 %
Lymphocytes Relative: 17 %
Lymphs Abs: 2 10*3/uL (ref 0.7–4.0)
MCH: 27.2 pg (ref 26.0–34.0)
MCHC: 33.5 g/dL (ref 30.0–36.0)
MCV: 81.2 fL (ref 80.0–100.0)
Monocytes Absolute: 0.4 10*3/uL (ref 0.1–1.0)
Monocytes Relative: 3 %
Neutro Abs: 9.3 10*3/uL — ABNORMAL HIGH (ref 1.7–7.7)
Neutrophils Relative %: 79 %
Platelets: 507 10*3/uL — ABNORMAL HIGH (ref 150–400)
RBC: 3.93 MIL/uL — ABNORMAL LOW (ref 4.22–5.81)
RDW: 18.6 % — ABNORMAL HIGH (ref 11.5–15.5)
WBC: 11.8 10*3/uL — ABNORMAL HIGH (ref 4.0–10.5)
nRBC: 0 % (ref 0.0–0.2)

## 2022-08-09 LAB — COMPREHENSIVE METABOLIC PANEL
ALT: 17 U/L (ref 0–44)
AST: 35 U/L (ref 15–41)
Albumin: 3.3 g/dL — ABNORMAL LOW (ref 3.5–5.0)
Alkaline Phosphatase: 63 U/L (ref 38–126)
Anion gap: 12 (ref 5–15)
BUN: 41 mg/dL — ABNORMAL HIGH (ref 8–23)
CO2: 22 mmol/L (ref 22–32)
Calcium: 8 mg/dL — ABNORMAL LOW (ref 8.9–10.3)
Chloride: 103 mmol/L (ref 98–111)
Creatinine, Ser: 1.6 mg/dL — ABNORMAL HIGH (ref 0.61–1.24)
GFR, Estimated: 44 mL/min — ABNORMAL LOW (ref >60)
Glucose, Bld: 158 mg/dL — ABNORMAL HIGH (ref 70–99)
Potassium: 3.6 mmol/L (ref 3.5–5.1)
Sodium: 137 mmol/L (ref 135–145)
Total Bilirubin: 0.8 mg/dL (ref 0.3–1.2)
Total Protein: 7.7 g/dL (ref 6.5–8.1)

## 2022-08-09 LAB — PROCALCITONIN: Procalcitonin: 1.6 ng/mL

## 2022-08-09 LAB — BLOOD GAS, ARTERIAL
Acid-base deficit: 1.2 mmol/L (ref 0.0–2.0)
Bicarbonate: 23.6 mmol/L (ref 20.0–28.0)
FIO2: 50 %
MECHVT: 450 mL
Mechanical Rate: 16
O2 Saturation: 99 %
PEEP: 5 cmH2O
Patient temperature: 37
pCO2 arterial: 39 mmHg (ref 32–48)
pH, Arterial: 7.39 (ref 7.35–7.45)
pO2, Arterial: 99 mmHg (ref 83–108)

## 2022-08-09 LAB — GLUCOSE, CAPILLARY
Glucose-Capillary: 157 mg/dL — ABNORMAL HIGH (ref 70–99)
Glucose-Capillary: 119 mg/dL — ABNORMAL HIGH (ref 70–99)
Glucose-Capillary: 102 mg/dL — ABNORMAL HIGH (ref 70–99)

## 2022-08-09 LAB — URINALYSIS, W/ REFLEX TO CULTURE (INFECTION SUSPECTED)
Bilirubin Urine: NEGATIVE
Glucose, UA: NEGATIVE mg/dL
Ketones, ur: NEGATIVE mg/dL
Nitrite: POSITIVE — AB
Protein, ur: 100 mg/dL — AB
Specific Gravity, Urine: 1.017 (ref 1.005–1.030)
WBC, UA: >50 WBC/hpf (ref 0–5)
pH: 5 (ref 5.0–8.0)

## 2022-08-09 LAB — MRSA NEXT GEN BY PCR, NASAL: MRSA by PCR Next Gen: NOT DETECTED

## 2022-08-09 LAB — APTT: aPTT: 35 seconds (ref 24–36)

## 2022-08-09 LAB — LACTIC ACID, PLASMA
Lactic Acid, Venous: 1.6 mmol/L (ref 0.5–1.9)
Lactic Acid, Venous: 1.1 mmol/L (ref 0.5–1.9)

## 2022-08-09 LAB — C-REACTIVE PROTEIN: CRP: 7.1 mg/dL — ABNORMAL HIGH (ref <1.0)

## 2022-08-09 LAB — TROPONIN I (HIGH SENSITIVITY): Troponin I (High Sensitivity): 216 ng/L (ref <18)

## 2022-08-09 LAB — SARS CORONAVIRUS 2 BY RT PCR: SARS Coronavirus 2 by RT PCR: NEGATIVE

## 2022-08-09 MED ORDER — NOREPINEPHRINE 4 MG/250ML-% IV SOLN
2.0000 ug/min | INTRAVENOUS | Status: DC
  Administered 2022-08-09: 7 ug/min via INTRAVENOUS
  Administered 2022-08-09: 2 ug/min via INTRAVENOUS
  Filled 2022-08-09 (×2): qty 250

## 2022-08-09 MED ORDER — DEXMEDETOMIDINE HCL IN NACL 400 MCG/100ML IV SOLN
INTRAVENOUS | Status: AC
  Administered 2022-08-09: 0.4 ug/kg/h via INTRAVENOUS
  Filled 2022-08-09: qty 100

## 2022-08-09 MED ORDER — ETOMIDATE 2 MG/ML IV SOLN
INTRAVENOUS | Status: AC
  Administered 2022-08-09: 20 mg
  Filled 2022-08-09: qty 20

## 2022-08-09 MED ORDER — SODIUM CHLORIDE 0.9 % IV SOLN
3.0000 g | Freq: Four times a day (QID) | INTRAVENOUS | Status: DC
  Administered 2022-08-09 – 2022-08-11 (×8): 3 g via INTRAVENOUS
  Filled 2022-08-09 (×10): qty 8

## 2022-08-09 MED ORDER — ORAL CARE MOUTH RINSE: 15.0000 mL | OROMUCOSAL | Status: DC | PRN

## 2022-08-09 MED ORDER — DEXMEDETOMIDINE HCL IN NACL 400 MCG/100ML IV SOLN: 0.0000 ug/kg/h | INTRAVENOUS | Status: DC

## 2022-08-09 MED ORDER — ENOXAPARIN SODIUM 40 MG/0.4ML IJ SOSY
40.0000 mg | PREFILLED_SYRINGE | INTRAMUSCULAR | Status: DC
  Administered 2022-08-09 – 2022-08-12 (×4): 40 mg via SUBCUTANEOUS
  Filled 2022-08-09 (×4): qty 0.4

## 2022-08-09 MED ORDER — MIDAZOLAM HCL 2 MG/2ML IJ SOLN: 1.0000 mg | INTRAMUSCULAR | Status: DC | PRN

## 2022-08-09 MED ORDER — VANCOMYCIN HCL IN DEXTROSE 1-5 GM/200ML-% IV SOLN
1000.0000 mg | Freq: Once | INTRAVENOUS | Status: AC
  Administered 2022-08-09: 1000 mg via INTRAVENOUS
  Filled 2022-08-09: qty 200

## 2022-08-09 MED ORDER — ACETAMINOPHEN 10 MG/ML IV SOLN
1000.0000 mg | Freq: Once | INTRAVENOUS | Status: AC
  Administered 2022-08-09: 1000 mg via INTRAVENOUS
  Filled 2022-08-09: qty 100

## 2022-08-09 MED ORDER — IOHEXOL 300 MG/ML  SOLN
80.0000 mL | Freq: Once | INTRAMUSCULAR | Status: AC | PRN
  Administered 2022-08-09: 80 mL via INTRAVENOUS

## 2022-08-09 MED ORDER — CHLORHEXIDINE GLUCONATE CLOTH 2 % EX PADS
6.0000 | MEDICATED_PAD | Freq: Every day | CUTANEOUS | Status: DC
  Administered 2022-08-09 – 2022-08-15 (×7): 6 via TOPICAL

## 2022-08-09 MED ORDER — METRONIDAZOLE 500 MG/100ML IV SOLN
500.0000 mg | Freq: Once | INTRAVENOUS | Status: AC
  Administered 2022-08-09: 500 mg via INTRAVENOUS
  Filled 2022-08-09: qty 100

## 2022-08-09 MED ORDER — FAMOTIDINE 20 MG PO TABS
20.0000 mg | ORAL_TABLET | Freq: Two times a day (BID) | ORAL | Status: DC
  Administered 2022-08-10 (×2): 20 mg
  Filled 2022-08-09 (×3): qty 1

## 2022-08-09 MED ORDER — PROPOFOL 1000 MG/100ML IV EMUL
INTRAVENOUS | Status: AC
  Filled 2022-08-09: qty 100

## 2022-08-09 MED ORDER — SODIUM CHLORIDE 0.9 % IV SOLN
500.0000 mg | INTRAVENOUS | Status: DC
  Administered 2022-08-09 – 2022-08-14 (×6): 500 mg via INTRAVENOUS
  Filled 2022-08-09 (×9): qty 5

## 2022-08-09 MED ORDER — LACTATED RINGERS IV BOLUS
1000.0000 mL | Freq: Once | INTRAVENOUS | Status: AC
  Administered 2022-08-09: 1000 mL via INTRAVENOUS

## 2022-08-09 MED ORDER — FENTANYL 2500MCG IN NS 250ML (10MCG/ML) PREMIX INFUSION
0.0000 ug/h | INTRAVENOUS | Status: DC
  Administered 2022-08-09: 25 ug/h via INTRAVENOUS
  Filled 2022-08-09: qty 250

## 2022-08-09 MED ORDER — HYDROCORTISONE SOD SUC (PF) 100 MG IJ SOLR
100.0000 mg | Freq: Two times a day (BID) | INTRAMUSCULAR | Status: DC
  Administered 2022-08-09 (×2): 100 mg via INTRAVENOUS
  Filled 2022-08-09 (×2): qty 2

## 2022-08-09 MED ORDER — DOCUSATE SODIUM 100 MG PO CAPS: 100.0000 mg | ORAL_CAPSULE | Freq: Two times a day (BID) | ORAL | Status: DC | PRN

## 2022-08-09 MED ORDER — SODIUM CHLORIDE 0.9 % IV SOLN
2.0000 g | Freq: Once | INTRAVENOUS | Status: AC
  Administered 2022-08-09: 2 g via INTRAVENOUS
  Filled 2022-08-09: qty 12.5

## 2022-08-09 MED ORDER — LACTATED RINGERS IV BOLUS
2000.0000 mL | Freq: Once | INTRAVENOUS | Status: AC
  Administered 2022-08-09: 2000 mL via INTRAVENOUS

## 2022-08-09 MED ORDER — ORAL CARE MOUTH RINSE
15.0000 mL | OROMUCOSAL | Status: DC
  Administered 2022-08-09 – 2022-08-11 (×20): 15 mL via OROMUCOSAL

## 2022-08-09 MED ORDER — POLYETHYLENE GLYCOL 3350 17 G PO PACK: 17.0000 g | PACK | Freq: Every day | ORAL | Status: DC | PRN

## 2022-08-09 MED ORDER — SODIUM CHLORIDE 0.9 % IV SOLN
250.0000 mL | INTRAVENOUS | Status: DC
  Administered 2022-08-09: 250 mL via INTRAVENOUS

## 2022-08-09 MED ORDER — ROCURONIUM BROMIDE 10 MG/ML (PF) SYRINGE
PREFILLED_SYRINGE | INTRAVENOUS | Status: AC
  Administered 2022-08-09: 100 mg
  Filled 2022-08-09: qty 10

## 2022-08-09 NOTE — Progress Notes (Signed)
1910 patient alert on vent opens eyes to name follows commands. Patient on levo and pain medications.

## 2022-08-09 NOTE — Progress Notes (Signed)
PHARMACY -  BRIEF ANTIBIOTIC NOTE   Pharmacy has received consult(s) for vancomycin and cefepime from an ED provider.  The patient's profile has been reviewed for ht/wt/allergies/indication/available labs.    One time order(s) placed by MD for Cefepime 2gm and vancomycin 1 gm  Further antibiotics/pharmacy consults should be ordered by admitting physician if indicated.                       Thank you, Oyindamola Key A 08/09/2022  7:22 AM

## 2022-08-09 NOTE — ED Notes (Signed)
Pt 8.0 26 lip good color change

## 2022-08-09 NOTE — Sepsis Progress Note (Signed)
Elink will follow per sepsis protocol  

## 2022-08-09 NOTE — Consult Note (Signed)
PHARMACY CONSULT NOTE  Pharmacy Consult for Electrolyte Monitoring and Replacement   Recent Labs: Potassium (mmol/L)  Date Value  08/09/2022 3.6  02/10/2012 3.8   Magnesium (mg/dL)  Date Value  82/95/6213 1.9  02/10/2012 2.2   Calcium (mg/dL)  Date Value  08/65/7846 8.0 (L)   Calcium, Total (mg/dL)  Date Value  96/29/5284 8.4 (L)   Albumin (g/dL)  Date Value  13/24/4010 3.3 (L)  02/08/2012 2.7 (L)   Sodium (mmol/L)  Date Value  08/09/2022 137  02/10/2012 139     Assessment: 77 yo male presented to ED due to fever and altered mental status.  Patient's PMH includes HLD, HTN, DM, gout, and depression.  Patient intubated due to acute respiratory failure in the setting of likely aspiration pneumonia.  UA also concerning for UTI.  Patient being admitted to ICU for further care.  Goal of Therapy:  WNL  Plan:  No indication for replacement at this time Follow electrolytes with AM labs  Barrie Folk ,PharmD Clinical Pharmacist 08/09/2022 10:11 AM

## 2022-08-09 NOTE — Progress Notes (Signed)
CODE SEPSIS - PHARMACY COMMUNICATION  **Broad Spectrum Antibiotics should be administered within 1 hour of Sepsis diagnosis**  Time Code Sepsis Called/Page Received: 0706  Antibiotics Ordered: cefepime, vanc  Time of 1st antibiotic administration: 0728  Additional action taken by pharmacy:    If necessary, Name of Provider/Nurse Contacted:      Angelique Blonder ,PharmD Clinical Pharmacist  08/09/2022  7:53 AM

## 2022-08-09 NOTE — Consult Note (Signed)
Pharmacy Antibiotic Note  Marc Smith is a 77 y.o. male admitted on 08/09/2022 with aspiration pneumonia.  Pharmacy has been consulted for Unasyn dosing.  Cefepime/Flagyl/Vancomycin x 1 in ED 08/09/22  Plan: Start Unasyn 3 grams IV every 6 hours Azithromycin 500 mg IV every 24 hours per provider Follow renal function and cultures for further adjustments  Height: 6' (182.9 cm) Weight: 74.4 kg (164 lb 0.4 oz) IBW/kg (Calculated) : 77.6  Temp (24hrs), Avg:99.4 F (37.4 C), Min:97.6 F (36.4 C), Max:106.1 F (41.2 C)  Recent Labs  Lab 08/04/22 0902 08/09/22 0659 08/09/22 0904  WBC 6.7 11.8*  --   CREATININE  --  1.60*  --   LATICACIDVEN  --  1.6 1.1    Estimated Creatinine Clearance: 41.3 mL/min (A) (by C-G formula based on SCr of 1.6 mg/dL (H)).    No Known Allergies  Antimicrobials this admission: Cefepime/vancomycin/Flagyl 6/18 x 1 Unasyn 6/18 >>  Azithromycin 6/18 >>  Dose adjustments this admission: N/A  Microbiology results: 6/18 BCx: pending 6/18 UCx: pending  6/18 Sputum: ordered  6/18 MRSA PCR: ordered  Thank you for allowing pharmacy to be a part of this patient's care.  Barrie Folk, PharmD 08/09/2022 10:55 AM

## 2022-08-09 NOTE — Progress Notes (Signed)
Pt with 3 PIV's working well with good blood return per [employer].  Lanora Manis RN states conversation has been had re placing a CVC.Will hold on USG PIV at this time.  Place IV Team consult if USG PIV needed.

## 2022-08-09 NOTE — H&P (Addendum)
NAME:  Marc Smith, MRN:  [id number], DOB:  1945-04-27, LOS: 0 ADMISSION DATE:  08/09/2022, CONSULTATION DATE:   08/09/2022 REFERRING MD: EDP, CHIEF COMPLAINT: Acute respiratory failure, pneumonia  History of Present Illness:   77 year old nursing home resident with history of hyperlipidemia, hypertension, depression, gout, prior aspiration, essential thrombocytosis [JAK2 positive, on hydroxyurea] presenting with altered mental status, hypoxic respiratory failure, fevers and sepsis.  He was intubated in the ED and PCCM consulted for admission  He had an admission to the [facility] hospital for aspiration less than 1 month ago  Pertinent  Medical History    has a past medical history of Depression, Gout, Hyperlipidemia, and Hypertension.   Significant Hospital Events: Including procedures, antibiotic start and stop dates in addition to other pertinent events     Interim History / Subjective:    Objective   Blood pressure (!) 85/51, pulse 89, temperature 99 F (37.2 C), resp. rate (!) 22, weight 74.4 kg, SpO2 96 %.    Vent Mode: AC FiO2 (%):  [35 %] 35 % Set Rate:  [16 bmp] 16 bmp Vt Set:  [450 mL] 450 mL PEEP:  [5 cmH20] 5 cmH20   Intake/Output Summary (Last 24 hours) at 08/09/2022 0926 Last data filed at 08/09/2022 0921 Gross per 24 hour  Intake 200 ml  Output --  Net 200 ml   Filed Weights   08/09/22 0753  Weight: 74.4 kg    Examination: Blood pressure (!) 85/51, pulse 89, temperature 99 F (37.2 C), resp. rate (!) 22, weight 74.4 kg, SpO2 96 %. Gen:      No acute distress HEENT:  EOMI, sclera anicteric Neck:     No masses; no thyromegaly Lungs:    Clear to auscultation bilaterally; normal respiratory effort CV:         Regular rate and rhythm; no murmurs Abd:      + bowel sounds; soft, non-tender; no palpable masses, no distension Ext:    No edema; adequate peripheral perfusion Skin:      Warm and dry; no rash Neuro: Sedated  Labs/imaging reviewed  labs/imaging reviewed Significant for lactic acid 1.1 BUN/creatinine 41/1.60216 WBC 11.8, platelets 507  CT chest with airspace disease in the left lower lobe, patchy airspace opacities in the right lower lobe, thyroid nodule  Resolved Hospital Problem list     Assessment & Plan:  Acute hypoxic respiratory failure secondary to aspiration pneumonia Sepsis present on admission Continue vent support Follow ABG, chest x-ray Start antibiotic coverage with Unasyn and azithromycin Hydrocortisone per [facility] protocol Follow cultures Continue peripheral pressors.  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  They have asked that he be DNR with no chest compressions in case of CPR.  Continue vent support for now.  Will optimize him for one-way extubation over the next few days.  Best Practice (right click and "Reselect all SmartList Selections" daily)   Diet/type: NPO DVT prophylaxis: LMWH GI prophylaxis: H2B Lines: N/A Foley:  N/A Code Status:  limited Last date of multidisciplinary goals of care discussion []   Critical care time:    The patient is critically ill with multiple organ system failure and requires high complexity decision making for assessment and support, frequent evaluation and titration of therapies, advanced monitoring,  review of radiographic studies and interpretation of complex data.   Critical Care Time devoted to patient care services, exclusive of separately billable procedures, described in this note is 35 minutes.   Chilton Greathouse MD [facility] See Amion for pager  If no response to pager , please call [phone] until 7pm After 7:00 pm call Elink  [phone] 08/09/2022, 10:51 AM

## 2022-08-09 NOTE — Progress Notes (Signed)
An USGPIV (ultrasound guided PIV) has been placed for short-term vasopressor infusion. A correctly placed ivWatch must be used when administering Vasopressors. Should this treatment be needed beyond 72 hours, central line access should be obtained.  It will be the responsibility of the bedside nurse to follow best practice to prevent extravasations.   

## 2022-08-09 NOTE — Plan of Care (Signed)
  Problem: Education: Goal: Knowledge of General Education information will improve Description: Including pain rating scale, medication(s)/side effects and non-pharmacologic comfort measures Outcome: Not Progressing   Problem: Health Behavior/Discharge Planning: Goal: Ability to manage health-related needs will improve Outcome: Progressing   Problem: Clinical Measurements: Goal: Ability to maintain clinical measurements within normal limits will improve Outcome: Progressing Goal: Will remain free from infection Outcome: Progressing Goal: Diagnostic test results will improve Outcome: Progressing Goal: Respiratory complications will improve Outcome: Progressing Goal: Cardiovascular complication will be avoided Outcome: Progressing   Problem: Activity: Goal: Risk for activity intolerance will decrease Outcome: Progressing   Problem: Nutrition: Goal: Adequate nutrition will be maintained Outcome: Not Progressing   Problem: Coping: Goal: Level of anxiety will decrease Outcome: Progressing   Problem: Elimination: Goal: Will not experience complications related to bowel motility Outcome: Progressing Goal: Will not experience complications related to urinary retention Outcome: Progressing   Problem: Pain Managment: Goal: General experience of comfort will improve Outcome: Progressing   Problem: Safety: Goal: Ability to remain free from injury will improve Outcome: Not Progressing   Problem: Skin Integrity: Goal: Risk for impaired skin integrity will decrease Outcome: Not Progressing Patient on vent follows commands total care at this time

## 2022-08-09 NOTE — ED Provider Notes (Signed)
[facility] Provider Note    Event Date/Time   First MD Initiated Contact with Patient 08/09/22 [phone]     (approximate)   History   Chief Complaint Fever (Medic called out to [place] at [place] for fever 105.7 axillary. Medic observed patient to be retracting and unresponsive. Patient temp 106.1 rectal in ED)   HPI  Marc Smith is a 77 y.o. male with past medical history of hypertension, hyperlipidemia, diabetes, aspiration, gout, and depression who presents to the ED for fever.  History is limited due to patient's altered mental status.  Per EMS, staff at the [facility] found patient minimally responsive with trouble breathing this morning, was subsequently found to have a fever of 105.7.  Patient remained unresponsive on EMS arrival, reportedly had oxygen saturations in the 70s on room air and was placed on 6 L nasal cannula with improvement.  Patient is reportedly awake and alert at baseline, was admitted to [facility] for hemostasis and aspiration less than 1 month ago.   Physical Exam   Triage Vital Signs: ED Triage Vitals  Enc Vitals Group     BP 08/09/22 0656 (!) 118/54     Pulse Rate 08/09/22 0656 (!) 125     Resp 08/09/22 0656 (!) 42     Temp 08/09/22 0656 (!) 106.1 F (41.2 C)     Temp Source 08/09/22 0656 Oral     SpO2 08/09/22 0655 90 %     Weight --      Height --      Head Circumference --      Peak Flow --      Pain Score --      Pain Loc --      Pain Edu? --      Excl. in GC? --     Most recent vital signs: Vitals:   08/09/22 0900 08/09/22 0915  BP: (!) 85/51   Pulse: 98 89  Resp: 19 (!) 22  Temp: 99.3 F (37.4 C) 99 F (37.2 C)  SpO2:  96%    Constitutional: Unresponsive. Eyes: Conjunctivae are normal.  Pupils equal, round, and reactive to light bilaterally. Head: Atraumatic. Nose: No congestion/rhinnorhea. Mouth/Throat: Mucous membranes are moist.  Cardiovascular: Tachycardic, regular rhythm. Grossly  normal heart sounds.  2+ radial pulses bilaterally. Respiratory: Tachypneic with increased respiratory effort and accessory muscle use, crackles noted bilaterally. Gastrointestinal: Soft and nontender. No distention. Musculoskeletal: No lower extremity tenderness nor edema.  Neurologic: No verbal response, does not open eyes to voice or localize to painful stimuli.    ED Results / Procedures / Treatments   Labs (all labs ordered are listed, but only abnormal results are displayed) Labs Reviewed  COMPREHENSIVE METABOLIC PANEL - Abnormal; Notable for the following components:      Result Value   Glucose, Bld 158 (*)    BUN 41 (*)    Creatinine, Ser 1.60 (*)    Calcium 8.0 (*)    Albumin 3.3 (*)    GFR, Estimated 44 (*)    All other components within normal limits  CBC WITH DIFFERENTIAL/PLATELET - Abnormal; Notable for the following components:   WBC 11.8 (*)    RBC 3.93 (*)    Hemoglobin 10.7 (*)    HCT 31.9 (*)    RDW 18.6 (*)    Platelets 507 (*)    Neutro Abs 9.3 (*)    All other components within normal limits  PROTIME-INR - Abnormal; Notable for  the following components:   Prothrombin Time 16.0 (*)    INR 1.3 (*)    All other components within normal limits  URINALYSIS, W/ REFLEX TO CULTURE (INFECTION SUSPECTED) - Abnormal; Notable for the following components:   Color, Urine AMBER (*)    APPearance CLOUDY (*)    Hgb urine dipstick MODERATE (*)    Protein, ur 100 (*)    Nitrite POSITIVE (*)    Leukocytes,Ua LARGE (*)    Bacteria, UA MANY (*)    All other components within normal limits  TROPONIN I (HIGH SENSITIVITY) - Abnormal; Notable for the following components:   Troponin I (High Sensitivity) 216 (*)    All other components within normal limits  CULTURE, BLOOD (ROUTINE X 2)  CULTURE, BLOOD (ROUTINE X 2)  SARS CORONAVIRUS 2 BY RT PCR  URINE CULTURE  LACTIC ACID, PLASMA  APTT  PROCALCITONIN  MAGNESIUM  LACTIC ACID, PLASMA     EKG  ED ECG REPORT I,  Chesley Noon, the attending physician, personally viewed and interpreted this ECG.   Date: 08/09/2022  EKG Time: 6:54  Rate: 125  Rhythm: sinus tachycardia  Axis: Normal  Intervals:none  ST&T Change: None  ED ECG REPORT I, Chesley Noon, the attending physician, personally viewed and interpreted this ECG.   Date: 08/09/2022  EKG Time: 7:45  Rate: 138  Rhythm: atrial fibrillation  Axis: Normal  Intervals:none  ST&T Change: Diffuse ST changes consistent with global ischemia   RADIOLOGY Chest x-ray reviewed and interpreted by me with no infiltrate, edema, or effusion.  PROCEDURES:  Critical Care performed: Yes, see critical care procedure note(s)  .Critical Care  Performed by: Chesley Noon, MD Authorized by: Chesley Noon, MD   Critical care provider statement:    Critical care time (minutes):  75   Critical care time was exclusive of:  Separately billable procedures and treating other patients and teaching time   Critical care was necessary to treat or prevent imminent or life-threatening deterioration of the following conditions:  Respiratory failure and sepsis   Critical care was time spent personally by me on the following activities:  Development of treatment plan with patient or surrogate, discussions with consultants, evaluation of patient's response to treatment, examination of patient, ordering and review of laboratory studies, ordering and review of radiographic studies, ordering and performing treatments and interventions, pulse oximetry, re-evaluation of patient's condition and review of old charts   I assumed direction of critical care for this patient from another provider in my specialty: no     Care discussed with: admitting provider   Procedure Name: Intubation Date/Time: 08/09/2022 8:01 AM  Performed by: Chesley Noon, MDPre-anesthesia Checklist: Patient identified, Patient being monitored, Emergency Drugs available, Timeout performed and Suction  available Oxygen Delivery Method: Non-rebreather mask Preoxygenation: Pre-oxygenation with 100% oxygen Induction Type: Rapid sequence Ventilation: Mask ventilation without difficulty Laryngoscope Size: Glidescope and 4 Grade View: Grade I Tube size: 8.0 mm Number of attempts: 1 Airway Equipment and Method: Video-laryngoscopy Placement Confirmation: ETT inserted through vocal cords under direct vision, CO2 detector and Breath sounds checked- equal and bilateral Secured at: 26 cm Tube secured with: ETT holder Dental Injury: Teeth and Oropharynx as per pre-operative assessment  Comments: ET tube initially placed to 26 cm, diminished breath sounds noted on right and tube was retracted to 24 cm at the lip.  Tube positioning was rechecked with laryngoscope and appears appropriately positioned.  End-tidal CO2 monitor continues to show good color change with equal breath sounds bilaterally.  MEDICATIONS ORDERED IN ED: Medications  dexmedetomidine (PRECEDEX) 400 MCG/100ML (4 mcg/mL) infusion (0.4 mcg/kg/hr  74.4 kg Intravenous New Bag/Given 08/09/22 0808)  midazolam (VERSED) injection 1-2 mg (has no administration in time range)  0.9 %  sodium chloride infusion (has no administration in time range)  norepinephrine (LEVOPHED) 4mg  in (0.016 mg/mL) premix infusion (has no administration in time range)  acetaminophen (OFIRMEV) IV 1,000 mg (0 mg Intravenous Stopped 08/09/22 0741)  lactated ringers bolus 2,000 mL ( Intravenous Restarted 08/09/22 0809)  ceFEPIme (MAXIPIME) 2 g in sodium chloride 0.9 % 100 mL IVPB (0 g Intravenous Stopped 08/09/22 0800)  metroNIDAZOLE (FLAGYL) IVPB 500 mg (0 mg Intravenous Stopped 08/09/22 0921)  vancomycin (VANCOCIN) IVPB 1000 mg/200 mL premix (1,000 mg Intravenous New Bag/Given 08/09/22 0823)  rocuronium bromide 100 MG/10ML SOSY (100 mg  Given 08/09/22 0741)  etomidate (AMIDATE) 2 MG/ML injection (20 mg  Given 08/09/22 0741)  iohexol (OMNIPAQUE) 300 MG/ML  solution 80 mL (80 mLs Intravenous Contrast Given 08/09/22 0831)     IMPRESSION / MDM / ASSESSMENT AND PLAN / ED COURSE  I reviewed the triage vital signs and the nursing notes.                              77 y.o. male with past medical history of hypertension, diabetes, hyperlipidemia, aspiration, gout, and depression who presents to the ED for altered mental status and respiratory distress after being found unresponsive by staff at his nursing facility this morning.  Patient's presentation is most consistent with acute presentation with potential threat to life or bodily function.  Differential diagnosis includes, but is not limited to, sepsis, pneumonia, aspiration, UTI, intra-abdominal infection, intracranial process, electrolyte abnormality, AKI.  Patient ill-appearing and in severe respiratory distress on arrival, noted to be febrile to 106 rectally with associated tachycardia and tachypnea.  He is maintaining oxygen saturations on 6 L nasal cannula but continues to be tachypneic into the 40s, placed on high flow nasal cannula due to concern for aspiration.  I discussed patient's CODE STATUS with his niece, Auron Forrister, by phone as I was unable to reach the initial listed contact, Norberto Sorenson.  Patient listed as full code at his nursing facility, and he states that she would like patient to be intubated for now before family can have a more extensive discussion of goals of care.  Patient was intubated without difficulty, tube initially at 26 cm at the lip, subsequently retracted to 24 cm after breath sounds found to be diminished on the right.  To positioning was rechecked with laryngoscope and appears appropriate, chest x-ray pending at this time.  Initial chest x-ray without acute process, shows chronic elevation of left hemidiaphragm.  Initial EKG with sinus tachycardia with no ischemic changes, patient did appear to have a rhythm changed to atrial fibrillation during intubation, diffuse  ST changes consistent with global ischemia noted.  Labs remarkable for leukocytosis, no significant anemia noted.  Patient with AKI but no significant electrolyte abnormality, LFTs are unremarkable and lactic acid within normal limits.  We will further assess with CT imaging of his head, chest, and abdomen/pelvis.  Broad-spectrum antibiotics started, patient given 500 cc of fluids by EMS, will give additional 2 L IV fluids to complete 30 cc/kg bolus.  Patient with some borderline low blood pressures, will start on Precedex for sedation.  CT head is negative for acute process, CT chest/abdomen/pelvis shows significant pneumonia which is likely  the source of patient's symptoms.  Urinalysis also concerning for UTI.  Patient has received broad-spectrum antibiotics, but on reassessment he is more hypotensive with last few maps in the low 60s.  He has received 30 cc/kg IV fluids, will start peripheral Levophed for blood pressure support.  Case discussed with hospitalist for admission.      FINAL CLINICAL IMPRESSION(S) / ED DIAGNOSES   Final diagnoses:  Sepsis with acute hypoxic respiratory failure and septic shock, due to unspecified organism (HCC)  Aspiration pneumonia of both lungs, unspecified aspiration pneumonia type, unspecified part of lung (HCC)  NSTEMI (non-ST elevated myocardial infarction) (HCC)     Rx / DC Orders   ED Discharge Orders     None        Note:  This document was prepared using Dragon voice recognition software and may include unintentional dictation errors.   Chesley Noon, MD 08/09/22 [phone]

## 2022-08-09 NOTE — ED Triage Notes (Signed)
Medic called out to [place] at [place] for fever 105.7 axillary. Medic observed patient to be retracting and unresponsive. Patient temp 106.1 rectal

## 2022-08-09 NOTE — ED Notes (Signed)
MD notified of pt being on 98mcg/min on LEVO.

## 2022-08-10 DIAGNOSIS — J690 Pneumonitis due to inhalation of food and vomit: Secondary | ICD-10-CM | POA: Diagnosis not present

## 2022-08-10 LAB — BASIC METABOLIC PANEL
Anion gap: 6 (ref 5–15)
BUN: 34 mg/dL — ABNORMAL HIGH (ref 8–23)
CO2: 24 mmol/L (ref 22–32)
Calcium: 7.6 mg/dL — ABNORMAL LOW (ref 8.9–10.3)
Chloride: 110 mmol/L (ref 98–111)
Creatinine, Ser: 1.05 mg/dL (ref 0.61–1.24)
GFR, Estimated: >60 mL/min (ref >60)
Glucose, Bld: 101 mg/dL — ABNORMAL HIGH (ref 70–99)
Potassium: 3.7 mmol/L (ref 3.5–5.1)
Sodium: 140 mmol/L (ref 135–145)

## 2022-08-10 LAB — C-REACTIVE PROTEIN: CRP: 10.2 mg/dL — ABNORMAL HIGH (ref <1.0)

## 2022-08-10 LAB — GLUCOSE, CAPILLARY
Glucose-Capillary: 94 mg/dL (ref 70–99)
Glucose-Capillary: 86 mg/dL (ref 70–99)
Glucose-Capillary: 89 mg/dL (ref 70–99)
Glucose-Capillary: 72 mg/dL (ref 70–99)
Glucose-Capillary: 95 mg/dL (ref 70–99)

## 2022-08-10 LAB — CULTURE, BLOOD (ROUTINE X 2): Culture: NO GROWTH

## 2022-08-10 LAB — MAGNESIUM: Magnesium: 2.1 mg/dL (ref 1.7–2.4)

## 2022-08-10 LAB — URINE CULTURE

## 2022-08-10 LAB — PHOSPHORUS: Phosphorus: 5.2 mg/dL — ABNORMAL HIGH (ref 2.5–4.6)

## 2022-08-10 MED ORDER — HYDROCORTISONE SOD SUC (PF) 100 MG IJ SOLR
50.0000 mg | Freq: Two times a day (BID) | INTRAMUSCULAR | Status: DC
  Administered 2022-08-10 – 2022-08-12 (×5): 50 mg via INTRAVENOUS
  Filled 2022-08-10: qty 2
  Filled 2022-08-10: qty 1
  Filled 2022-08-10 (×2): qty 2
  Filled 2022-08-10: qty 1

## 2022-08-10 NOTE — Progress Notes (Addendum)
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  He was intubated in the ED and PCCM consulted for admission  He had an admission to the [facility] hospital for aspiration less than 1 month ago  Pertinent  Medical History    has a past medical history of Depression, Gout, Hyperlipidemia, and Hypertension.   Significant Hospital Events: Including procedures, antibiotic start and stop dates in addition to other pertinent events   6/18 admitted  Interim History / Subjective:   More awake today and was extubated today morning without any issue  Objective   Blood pressure 113/69, pulse 63, temperature 97.9 F (36.6 C), temperature source Bladder, resp. rate 17, height 6' (1.829 m), weight 70 kg, SpO2 96 %.    Vent Mode: PRVC FiO2 (%):  [35 %-50 %] 35 % Set Rate:  [16 bmp] 16 bmp Vt Set:  [450 mL] 450 mL PEEP:  [5 cmH20] 5 cmH20 Plateau Pressure:  [12 cmH20-14 cmH20] 14 cmH20   Intake/Output Summary (Last 24 hours) at 08/10/2022 0827 Last data filed at 08/10/2022 0600 Gross per 24 hour  Intake 4696.22 ml  Output 1535 ml  Net 3161.22 ml   Filed Weights   08/09/22 0935 08/09/22 1131 08/10/22 0500  Weight: 74.4 kg 68.1 kg 70 kg    Examination: Gen:      No acute distress, chronically ill-appearing HEENT:  EOMI, sclera anicteric Neck:     No masses; no thyromegaly Lungs:    Clear to auscultation bilaterally; normal respiratory effort CV:         Regular rate and rhythm; no murmurs Abd:      + bowel sounds; soft, non-tender; no palpable masses,  no distension Ext:    No edema; adequate peripheral perfusion Skin:      Warm and dry; no rash Neuro: Confused  Labs/imaging reviewed labs/imaging reviewed Significant for BUN/creatinine 34/1.05, Phos 5.2  CT chest with airspace disease in the left lower lobe, patchy airspace opacities in the right lower lobe, thyroid nodule  Resolved Hospital Problem list     Assessment & Plan:  Acute hypoxic respiratory failure secondary to aspiration pneumonia Sepsis present on admission Stable postextubation.  Wean down FiO2 as tolerated Continue vent support Antibiotic coverage with Unasyn and azithromycin Taper steroids Follow cultures Off peripheral pressors Swallow eval for chronic aspiration on extubation  UTI UA is positive Follow cultures, antibiotics as above  Thrombocytosis, JAK2 positive Hold hydroxyurea for now  Goals of care Discussed with niece Laurelyn Sickle today 6/19.  Now that he is extubated.  They are okay with any CODE STATUS to full DNR with no reintubation.  Best Practice (right click and "Reselect all SmartList Selections" daily)   Diet/type: NPO, needs swallow eval DVT prophylaxis: LMWH GI prophylaxis: H2B Lines: N/A Foley:  N/A Code Status:  DNR Last date of multidisciplinary goals of care discussion []  6/19  Critical care time: NA   Chilton Greathouse MD [facility] See Amion for pager  If no response to pager , please call [phone] until 7pm After 7:00 pm call Elink  [phone] 08/10/2022, 8:32 AM

## 2022-08-10 NOTE — Evaluation (Signed)
Physical Therapy Evaluation Patient Details Name: Marc Smith MRN: [id number] DOB: [birth year] Today's Date: 08/10/2022  History of Present Illness  Edmundo Lonsdale is a 77 y.o man admitted to the ICU with aspiration pneumonia, sepsis and NSTEMI. Previously lived at the [facility] assisted living facility.  Clinical Impression  Pt is a 77 year old male who was admitted for aspiration pneumonia, NSTEMI, and sepsis. Aox 2 (person and place), stated the year was 1974.  Pt performs bed mobility, transfers with min assist. Assist needed for back support and moving legs in bed. Verbal cueing needed to cue patient to fully stand. No ambulation performed due to safety concerns with patient impulsivity. Transferred step pivot to recliner with min assist +2 for lines and leads while using RW. Pt demonstrates deficits with cognition, mobility, ROM, strength, balance, coordination and safe DME use . Patient left in chair with alarm and call bell. Patient donning mitts at the beginning of the session, removed for RW use, donned at end of session. Maintained 4L of O2 during session, spo2 did not drop below 90% on O2. When removed from O2 to transfer from wall to tank patient quickly desaturated to the low 80s. Nursing notified of mobility status.Pt will continue to receive skilled PT services while admitted and will defer to TOC/care team for updates regarding disposition planning.       Recommendations for follow up therapy are one component of a multi-disciplinary discharge planning process, led by the attending physician.  Recommendations may be updated based on patient status, additional functional criteria and insurance authorization.  Follow Up Recommendations Can patient physically be transported by private vehicle: No     Assistance Recommended at Discharge Frequent or constant Supervision/Assistance  Patient can return home with the following  A lot of help with walking and/or transfers;A lot of help with  bathing/dressing/bathroom;Assistance with cooking/housework;Assistance with feeding;Direct supervision/assist for medications management;Direct supervision/assist for financial management;Assist for transportation;Help with stairs or ramp for entrance    Equipment Recommendations None recommended by PT  Recommendations for Other Services       Functional Status Assessment Patient has had a recent decline in their functional status and demonstrates the ability to make significant improvements in function in a reasonable and predictable amount of time.     Precautions / Restrictions Precautions Precautions: Fall Restrictions Weight Bearing Restrictions: No      Mobility  Bed Mobility Overal bed mobility: Needs Assistance Bed Mobility: Supine to Sit     Supine to sit: Min assist     General bed mobility comments: Min assist needed for back support when coming to sitting and moving legs off bed.    Transfers Overall transfer level: Needs assistance Equipment used: Rolling walker (2 wheels) Transfers: Bed to chair/wheelchair/BSC     Step pivot transfers: Min assist       General transfer comment: Patient min assist for sit<>stand, verbal cueing needed for knee extension. Impulsive on standing, shaky LE's.    Ambulation/Gait               General Gait Details: unable to assess due to safety concerns at this time.  Stairs            Wheelchair Mobility    Modified Rankin (Stroke Patients Only)       Balance Overall balance assessment: Needs assistance Sitting-balance support: Feet supported, No upper extremity supported Sitting balance-Leahy Scale: Fair     Standing balance support: Reliant on assistive device for balance, During functional  activity, Bilateral upper extremity supported Standing balance-Leahy Scale: Poor Standing balance comment: Patient requires cueing to extend knees when coming into standing. Impulsive in standing.                              Pertinent Vitals/Pain Pain Assessment Pain Assessment: No/denies pain    Home Living Family/patient expects to be discharged to:: Private residence                   Additional Comments: Patient poor historian, documentation states he lives at the [facility] assisted living facility. No family availible at time of eval to confirm.    Prior Function Prior Level of Function : Patient poor historian/Family not available;Needs assist             Mobility Comments: Patient relayed varying information on home set up during evaluation. Could not maintain his story. No family present to verify information given.       Hand Dominance        Extremity/Trunk Assessment   Upper Extremity Assessment Upper Extremity Assessment: Overall WFL for tasks assessed    Lower Extremity Assessment Lower Extremity Assessment: Overall WFL for tasks assessed       Communication   Communication: HOH;Other (comment) (Difficulty enunciating words.)  Cognition Arousal/Alertness: Awake/alert Behavior During Therapy: Impulsive, Restless Overall Cognitive Status: No family/caregiver present to determine baseline cognitive functioning                                 General Comments: Patient currently wearing mitts due to impulsivity and pulling IV's out per nursing. Patient is poor historian and provides inconsistent information, no family present to confirm.        General Comments      Exercises     Assessment/Plan    PT Assessment Patient needs continued PT services  PT Problem List Decreased range of motion;Decreased activity tolerance;Decreased balance;Decreased mobility;Decreased coordination;Decreased strength;Decreased cognition;Decreased knowledge of use of DME;Decreased safety awareness       PT Treatment Interventions DME instruction;Gait training;Stair training;Functional mobility training;Therapeutic activities;Therapeutic  exercise;Balance training;Patient/family education    PT Goals (Current goals can be found in the Care Plan section)  Acute Rehab PT Goals Patient Stated Goal: unable to state at this time. PT Goal Formulation: With patient Time For Goal Achievement: 08/24/22 Potential to Achieve Goals: Fair    Frequency Min 2X/week     Co-evaluation               AM-PAC PT "6 Clicks" Mobility  Outcome Measure Help needed turning from your back to your side while in a flat bed without using bedrails?: A Little Help needed moving from lying on your back to sitting on the side of a flat bed without using bedrails?: A Little Help needed moving to and from a bed to a chair (including a wheelchair)?: A Lot Help needed standing up from a chair using your arms (e.g., wheelchair or bedside chair)?: A Little Help needed to walk in hospital room?: A Lot Help needed climbing 3-5 steps with a railing? : A Lot 6 Click Score: 15    End of Session Equipment Utilized During Treatment: Gait belt;Oxygen Activity Tolerance: Patient tolerated treatment well Patient left: in chair;with call bell/phone within reach;with chair alarm set Nurse Communication: Mobility status PT Visit Diagnosis: Unsteadiness on feet (R26.81);Other abnormalities of gait and mobility (  R26.89);Muscle weakness (generalized) (M62.81);Difficulty in walking, not elsewhere classified (R26.2)    Time: 1610-9604 PT Time Calculation (min) (ACUTE ONLY): 26 min   Charges:   PT Evaluation $PT Eval Moderate Complexity: 1 [address], SPT   Malachi Carl 08/10/2022, 4:27 PM

## 2022-08-10 NOTE — Consult Note (Signed)
PHARMACY CONSULT NOTE  Pharmacy Consult for Electrolyte Monitoring and Replacement   Recent Labs: Potassium (mmol/L)  Date Value  08/10/2022 3.7  02/10/2012 3.8   Magnesium (mg/dL)  Date Value  95/28/4132 2.1  02/10/2012 2.2   Calcium (mg/dL)  Date Value  44/02/270 7.6 (L)   Calcium, Total (mg/dL)  Date Value  53/66/4403 8.4 (L)   Albumin (g/dL)  Date Value  47/42/5956 3.3 (L)  02/08/2012 2.7 (L)   Phosphorus (mg/dL)  Date Value  38/75/6433 5.2 (H)   Sodium (mmol/L)  Date Value  08/10/2022 140  02/10/2012 139     Assessment: 77 yo male presented to ED due to fever and altered mental status.  Patient's PMH includes HLD, HTN, DM, gout, and depression.  Patient intubated due to acute respiratory failure in the setting of likely aspiration pneumonia.  UA also concerning for UTI.  Patient being admitted to ICU for further care.  Goal of Therapy:  WNL  Plan:  No indication for replacement at this time Follow electrolytes with AM labs  Bettey Costa ,PharmD Clinical Pharmacist 08/10/2022 11:45 AM

## 2022-08-10 NOTE — TOC Initial Note (Addendum)
Transition of Care [facility]) - Initial/Assessment Note    Patient Details  Name: Marc Smith MRN: [id number] Date of Birth: 1945/08/14  Transition of Care [facility]) CM/SW Contact:    Margarito Liner, LCSW Phone Number: 08/10/2022, 1:25 PM  Clinical Narrative:  Per chart review, patient is from [facility]. CSW spoke to administrator, Amalia Hailey, who confirmed he is from their ALF side. Patient is active with [facility]. CSW left message for liaison to see which services he's active with. Patient has a wheelchair at the facility. He is currently on acute oxygen. Will follow for this potential need as well. PT and OT evaluations are pending. TOC will follow up once recommendations have been made.       3:11 pm: Patient is active with [facility] for PT and OT.           Expected Discharge Plan:  (TBD)     Patient Goals and CMS Choice            Expected Discharge Plan and Services     Post Acute Care Choice:  (TBD) Living arrangements for the past 2 months: Assisted Living Facility                                      Prior Living Arrangements/Services Living arrangements for the past 2 months: Assisted Living Facility Lives with:: Facility Resident Patient language and need for interpreter reviewed:: Yes        Need for Family Participation in Patient Care: Yes (Comment) Care giver support system in place?: Yes (comment) Current home services: DME Criminal Activity/Legal Involvement Pertinent to Current Situation/Hospitalization: No - Comment as needed  Activities of Daily Living Home Assistive Devices/Equipment: Wheelchair ADL Screening (condition at time of admission) Patient's cognitive ability adequate to safely complete daily activities?: No Patient able to express need for assistance with ADLs?: No Independently performs ADLs?: No Does the patient have difficulty walking or climbing stairs?: Yes Weakness of Legs: Both Weakness of Arms/Hands:  Both  Permission Sought/Granted                  Emotional Assessment       Orientation: : Oriented to Self, Oriented to Place, Oriented to  Time Alcohol / Substance Use: Not Applicable Psych Involvement: No (comment)  Admission diagnosis:  Aspiration pneumonia (HCC) [J69.0] NSTEMI (non-ST elevated myocardial infarction) (HCC) [I21.4] Aspiration pneumonia of both lungs, unspecified aspiration pneumonia type, unspecified part of lung (HCC) [J69.0] Sepsis with acute hypoxic respiratory failure and septic shock, due to unspecified organism (HCC) [A41.9, R65.21, J96.01] Patient Active Problem List   Diagnosis Date Noted   Stool incontinence 05/12/2019   Urinary incontinence 05/12/2019   Age-related physical debility 05/12/2019   Hyperlipidemia 05/12/2019   Acute pain of right lower extremity 05/08/2019   Tachycardia 05/07/2019   Aspiration pneumonia (HCC)    Elevated troponin I level    Thrombocytosis    Leukocytosis    Community acquired pneumonia    UTI (urinary tract infection) 11/20/2016   Acute lower UTI 11/20/2016   Gout attack 11/20/2016   Essential hypertension 11/20/2016   PCP:  [employer] [facility]:   [facility] - [place], [place] - [address] [place] [address] Phone: [phone] Fax: [phone]  [facility] - [place], [place] - [address] [place]  [place] [address] Phone: [phone] Fax: [phone]     Social Determinants of Health (SDOH) Social History: SDOH Screenings   Food Insecurity: No Food Insecurity (07/22/2022)  Housing: Patient Unable To Answer (07/22/2022)  Transportation Needs: No Transportation Needs (07/22/2022)  Utilities: Not At Risk (07/22/2022)  Depression (PHQ2-9): Low Risk  (07/22/2022)  Tobacco Use: High Risk (08/09/2022)   SDOH Interventions:     Readmission Risk Interventions     No data to display

## 2022-08-10 NOTE — Progress Notes (Signed)
Extubated patient to 4LPM nasal cannula per order from Dr. Katrinka Blazing.

## 2022-08-10 NOTE — Consult Note (Signed)
PHARMACY CONSULT NOTE  Pharmacy Consult for Electrolyte Monitoring and Replacement   Recent Labs: Potassium (mmol/L)  Date Value  08/10/2022 3.7  02/10/2012 3.8   Magnesium (mg/dL)  Date Value  16/11/9602 2.1  02/10/2012 2.2   Calcium (mg/dL)  Date Value  54/10/8117 7.6 (L)   Calcium, Total (mg/dL)  Date Value  14/78/2956 8.4 (L)   Albumin (g/dL)  Date Value  21/30/8657 3.3 (L)  02/08/2012 2.7 (L)   Phosphorus (mg/dL)  Date Value  84/69/6295 5.2 (H)   Sodium (mmol/L)  Date Value  08/10/2022 140  02/10/2012 139     Assessment: 77 yo male presented to ED due to fever and altered mental status.  Patient's PMH includes HLD, HTN, DM, gout, and depression.  Patient intubated due to acute respiratory failure in the setting of likely aspiration pneumonia.  UA also concerning for UTI.  Patient being admitted to ICU for further care.  Goal of Therapy:  WNL  Plan:  No indication for replacement at this time Follow electrolytes with AM labs  Bettey Costa ,PharmD Clinical Pharmacist 08/10/2022 7:38 AM

## 2022-08-10 NOTE — Evaluation (Signed)
Clinical/Bedside Swallow Evaluation Patient Details  Name: Marc Smith MRN: [id number] Date of Birth: [birth year]  Today's Date: 08/10/2022 Time: SLP Start Time (ACUTE ONLY): 1055 SLP Stop Time (ACUTE ONLY): 1115 SLP Time Calculation (min) (ACUTE ONLY): 20 min  Past Medical History:  Past Medical History:  Diagnosis Date   Depression    Gout    Per Patient's nephew   Hyperlipidemia    Patient's nephew   Hypertension    Past Surgical History: History reviewed. No pertinent surgical history. HPI:  77 year old nursing home resident with history of hyperlipidemia, hypertension, depression, gout, prior aspiration, essential thrombocytosis (JAK2 positive, on hydroxyurea) presenting with altered mental status, hypoxic respiratory failure, fevers and sepsis. Intubated in ED d/t acute hypoxic respiratory failure secondary to aspiration pneumonia. Extubated this AM- now on 4L O2. CT Chest 08/09/22: "Large airspace opacity is seen in left lower lobe consistent with pneumonia. Multiple patchy airspace opacities are noted in right lower lobe most consistent with pneumonia." Head CT 08/09/22: "No evidence of acute intracranial abnormality. Severe chronic microvascular ischemic disease." Pt is currently NPO awaiting swallow eval.    Assessment / Plan / Recommendation  Clinical Impression  Pt seen for bedside swallow assessment in the setting of suspected aspiration. Pt with history of dysphagia/aspiration PNA- most recent SLP intervention occurring during recent hospital stay at [facility]- with recommendations for a follow up MBSS and diet of Nectar thick liquids and regular solids. Last MBSS in chart was completed in 06/2019, revealing "Mild-Mod oropharyngeal phase dysphagia" at that time. Refer to chart for further details. Niece reported that for period of time pt received "swallow therapy" but that has stopped. Niece reported that pt was on a regular solids and thin liquids diet, though was not  100% certain.       Pt recently extubated this AM, with low vocal intensity noted. Adequate cough. Oral mech exam grossly intact. Limited dentition- no dentures currently. 4L O2 nasal canula in place- O2 saturations maintained at 100 for duration of trials. PO trials completed for ice chips, tsp/cup thin liquid, nectar thick liquids, puree and regular solids. Wet vocal quality detected with thin liquids via cup. Noted groping movements for initiation of swallow- potentially related to A-p transfer vs swallow initiation. No overt cough/throat clear noted t/o. Increased challenge for oral manipulation and clearance for regular solids vs puree. Pt benefiting from extended time and provision of liquid wash for clearance of regular solid residue. Dentition negatively impacting mastication, with pt reporting some meats were too challenging to chew.       Based on current respiratory status, deconditioning, and hx of dysphagia/aspiration, pt is at increased risk for aspiration. Therefore, recommend STRICT aspiration precautions, including slow rate, small bites, elevated HOB, NO straws, and alert for PO intake. Supervision for intake. Close monitoring for s/sx of aspiration and/or decline in respiratory/pulmonary status. Trial initiation of Dys 2 (finely chopped) and nectar thick liquids. Medications whole in puree. MBSS recommended for development of POC and to determine current oropharyngeal swallow status. Niece present t/o evaluation and in agreement with recommendations. SLP Visit Diagnosis: Dysphagia, oropharyngeal phase (R13.12)    Aspiration Risk  Moderate aspiration risk    Diet Recommendation     Medication Administration: Whole meds with puree    Other  Recommendations Oral Care Recommendations: Oral care before and after PO    Recommendations for follow up therapy are one component of a multi-disciplinary discharge planning process, led by the attending physician.  Recommendations may be updated  based on patient status, additional functional criteria and insurance authorization.  Follow up Recommendations   Defer to completion of MBSS     Assistance Recommended at Discharge  Constant supervision   Functional Status Assessment Patient has had a recent decline in their functional status and demonstrates the ability to make significant improvements in function in a reasonable and predictable amount of time.  Frequency and Duration   Defer to completion of MBSS         Prognosis Prognosis for improved oropharyngeal function: Fair Barriers to Reach Goals: Time post onset;Severity of deficits      Swallow Study   General Date of Onset: 08/10/22 HPI: 77 year old nursing home resident with history of hyperlipidemia, hypertension, depression, gout, prior aspiration, essential thrombocytosis (JAK2 positive, on hydroxyurea) presenting with altered mental status, hypoxic respiratory failure, fevers and sepsis. Intubated in ED d/t acute hypoxic respiratory failure secondary to aspiration pneumonia. Extubated this AM- now on 4L O2. CT Chest 08/09/22: "Large airspace opacity is seen in left lower lobe consistent with pneumonia. Multiple patchy airspace opacities are noted in right lower lobe most consistent with pneumonia." Head CT 08/09/22: "No evidence of acute intracranial abnormality. Severe chronic microvascular ischemic disease." Pt is currently NPO awaiting swallow eval. Type of Study: Bedside Swallow Evaluation Previous Swallow Assessment: MBSS 06/2019 Diet Prior to this Study: NPO Temperature Spikes Noted: Yes (spike of 106 for admission- lowere now. WBC elevated) Respiratory Status: Nasal cannula (4L) History of Recent Intubation: Yes Total duration of intubation (days): 1 days Date extubated: 08/10/22 Behavior/Cognition: Cooperative;Alert;Distractible Oral Cavity Assessment: Dry Oral Care Completed by SLP: Yes Oral Cavity - Dentition: Poor condition;Missing dentition Baseline Vocal  Quality: Low vocal intensity Volitional Cough: Strong Volitional Swallow: Unable to elicit    Oral/Motor/Sensory Function Overall Oral Motor/Sensory Function: Within functional limits   Ice Chips Ice chips: Within functional limits   Thin Liquid Thin Liquid: Impaired Presentation: Cup;Spoon Oral Phase Impairments: Reduced lingual movement/coordination Pharyngeal  Phase Impairments: Wet Vocal Quality    Nectar Thick Nectar Thick Liquid: Within functional limits Presentation: Cup   Honey Thick Honey Thick Liquid: Not tested   Puree Puree: Within functional limits   Solid     Solid: Impaired Presentation:  (therapist fed) Oral Phase Impairments: Reduced lingual movement/coordination;Impaired mastication Oral Phase Functional Implications: Oral residue;Impaired mastication;Prolonged oral transit Pharyngeal Phase Impairments:  (none)     [place] Harlem Bula Clapp MS [facility] SLP   [place] J Clapp 08/10/2022,11:47 AM

## 2022-08-11 ENCOUNTER — Inpatient Hospital Stay: Payer: 59

## 2022-08-11 DIAGNOSIS — R6521 Severe sepsis with septic shock: Secondary | ICD-10-CM | POA: Diagnosis not present

## 2022-08-11 DIAGNOSIS — J9601 Acute respiratory failure with hypoxia: Secondary | ICD-10-CM

## 2022-08-11 DIAGNOSIS — I214 Non-ST elevation (NSTEMI) myocardial infarction: Secondary | ICD-10-CM | POA: Diagnosis not present

## 2022-08-11 DIAGNOSIS — A419 Sepsis, unspecified organism: Secondary | ICD-10-CM | POA: Diagnosis not present

## 2022-08-11 DIAGNOSIS — J690 Pneumonitis due to inhalation of food and vomit: Secondary | ICD-10-CM | POA: Diagnosis not present

## 2022-08-11 LAB — BASIC METABOLIC PANEL
Anion gap: 9 (ref 5–15)
BUN: 29 mg/dL — ABNORMAL HIGH (ref 8–23)
CO2: 27 mmol/L (ref 22–32)
Calcium: 8.3 mg/dL — ABNORMAL LOW (ref 8.9–10.3)
Chloride: 110 mmol/L (ref 98–111)
Creatinine, Ser: 0.83 mg/dL (ref 0.61–1.24)
GFR, Estimated: >60 mL/min (ref >60)
Glucose, Bld: 101 mg/dL — ABNORMAL HIGH (ref 70–99)
Potassium: 3.1 mmol/L — ABNORMAL LOW (ref 3.5–5.1)
Sodium: 146 mmol/L — ABNORMAL HIGH (ref 135–145)

## 2022-08-11 LAB — CBC
HCT: 33 % — ABNORMAL LOW (ref 39.0–52.0)
Hemoglobin: 10.5 g/dL — ABNORMAL LOW (ref 13.0–17.0)
MCH: 26.5 pg (ref 26.0–34.0)
MCHC: 31.8 g/dL (ref 30.0–36.0)
MCV: 83.3 fL (ref 80.0–100.0)
Platelets: 444 10*3/uL — ABNORMAL HIGH (ref 150–400)
RBC: 3.96 MIL/uL — ABNORMAL LOW (ref 4.22–5.81)
RDW: 19.1 % — ABNORMAL HIGH (ref 11.5–15.5)
WBC: 14 10*3/uL — ABNORMAL HIGH (ref 4.0–10.5)
nRBC: 0 % (ref 0.0–0.2)

## 2022-08-11 LAB — URINE CULTURE: Culture: >=100000 — AB

## 2022-08-11 LAB — CULTURE, BLOOD (ROUTINE X 2): Culture: NO GROWTH

## 2022-08-11 LAB — GLUCOSE, CAPILLARY
Glucose-Capillary: 102 mg/dL — ABNORMAL HIGH (ref 70–99)
Glucose-Capillary: 74 mg/dL (ref 70–99)
Glucose-Capillary: 86 mg/dL (ref 70–99)
Glucose-Capillary: 89 mg/dL (ref 70–99)

## 2022-08-11 MED ORDER — SODIUM CHLORIDE 0.9 % IV SOLN
1.0000 g | INTRAVENOUS | Status: AC
  Administered 2022-08-11 – 2022-08-15 (×5): 1 g via INTRAVENOUS
  Filled 2022-08-11 (×5): qty 10

## 2022-08-11 MED ORDER — POTASSIUM CHLORIDE CRYS ER 20 MEQ PO TBCR
40.0000 meq | EXTENDED_RELEASE_TABLET | ORAL | Status: DC
  Filled 2022-08-11: qty 2

## 2022-08-11 MED ORDER — ORAL CARE MOUTH RINSE: 15.0000 mL | OROMUCOSAL | Status: DC | PRN

## 2022-08-11 MED ORDER — PSEUDOEPHEDRINE HCL ER 120 MG PO TB12
120.0000 mg | ORAL_TABLET | Freq: Once | ORAL | Status: DC
  Filled 2022-08-11: qty 1

## 2022-08-11 MED ORDER — GUAIFENESIN ER 600 MG PO TB12: 600.0000 mg | ORAL_TABLET | Freq: Once | ORAL | Status: DC

## 2022-08-11 MED ORDER — POTASSIUM CHLORIDE 10 MEQ/100ML IV SOLN
10.0000 meq | INTRAVENOUS | Status: AC
  Administered 2022-08-11 (×3): 10 meq via INTRAVENOUS
  Filled 2022-08-11 (×3): qty 100

## 2022-08-11 MED ORDER — SODIUM CHLORIDE 0.9 % IV SOLN: INTRAVENOUS | Status: AC

## 2022-08-11 MED ORDER — FAMOTIDINE 20 MG PO TABS: 20.0000 mg | ORAL_TABLET | Freq: Two times a day (BID) | ORAL | Status: DC

## 2022-08-11 MED ORDER — HALOPERIDOL LACTATE 5 MG/ML IJ SOLN
0.5000 mg | Freq: Four times a day (QID) | INTRAMUSCULAR | Status: DC | PRN
  Administered 2022-08-11: 0.5 mg via INTRAVENOUS
  Filled 2022-08-11: qty 1

## 2022-08-11 NOTE — Progress Notes (Signed)
Modified Barium Swallow Study  Patient Details  Name: EINO REZA MRN: [id number] Date of Birth: [birth year]  Today's Date: 08/11/2022  Modified Barium Swallow completed.  Full report located under Chart Review in the Imaging Section.  History of Present Illness 77 year old nursing home resident with history of hyperlipidemia, hypertension, depression, gout, prior aspiration, essential thrombocytosis (JAK2 positive, on hydroxyurea) presenting with altered mental status, hypoxic respiratory failure, fevers and sepsis. Intubated in ED d/t acute hypoxic respiratory failure secondary to aspiration pneumonia. Extubated this AM- now on 4L O2. CT Chest 08/09/22: "Large airspace opacity is seen in left lower lobe consistent with pneumonia. Multiple patchy airspace opacities are noted in right lower lobe most consistent with pneumonia." Head CT 08/09/22: "No evidence of acute intracranial abnormality. Severe chronic microvascular ischemic disease." Pt is currently NPO awaiting swallow eval.   Clinical Impression Pt presents with severe oropharyngeal dysphagia. Pharyngeal phase c/b reduced hyolaryngeal elevation/excursion, laryngeal vestibule closure and delayed swallow initiation to the pyriform sinuses. Stated deficits leading to aspiration of nectar and thin liquids during the swallow and aspiration of honey thick liquids before the swallow. Pt was inconsistently sensate to aspiration, with delayed, weak cough leading to ineffective airway clearance. Oral phase was disorganized for liquids, leading to anterior loss from the oral cavity and posterior loss to the pharynx with liquids. Liquid oral residue noted to spill to the pharynx leading to additional instance of aspiration subsequently during solid preparation. Oral phase for solids was significantly prolonged, with eventual partial clearance and loss to the pharynx. Given risk for aspiration based on stated oropharyngeal impairments, observed aspiration  across liquid consistencies, and overall health/deconditioning/hx of recurrent PNA, no "safe" diet can be recommended at this time. Recommend NPO with medications administered alternatively. SLP will follow up with family for education for results of assessment and recommendations. Recommend consult with palliative for discussion of GOC. MD and RN aware of recommendations.  Factors that may increase risk of adverse event in presence of aspiration [employer] 2021): Poor general health and/or compromised immunity;Reduced cognitive function;Frail or deconditioned;Dependence for feeding and/or oral hygiene;Inadequate oral hygiene;Reduced saliva;Weak cough;Aspiration of thick, dense, and/or acidic materials  Swallow Evaluation Recommendations Recommendations: NPO     [place] Turki Tapanes Clapp MS [facility] SLP [place] J Clapp 08/11/2022,9:29 AM

## 2022-08-11 NOTE — NC FL2 (Signed)
[place] MEDICAID FL2 LEVEL OF CARE FORM     IDENTIFICATION  Patient Name: Marc Smith Birthdate: 03-05-1945 Sex: male Admission Date (Current Location): 08/09/2022  [place] and [place] Number:  Chiropodist and Address:  [facility], [address] [place], [place] [address]      Provider Number: [id number]  Attending Physician Name and Address:  Leeroy Bock, MD  Relative Name and Phone Number:  [name] (nephew)  [phone]    Current Level of Care: Hospital Recommended Level of Care: Skilled Nursing Facility Prior Approval Number:    Date Approved/Denied:   PASRR Number: [id number]  Discharge Plan: SNF    Current Diagnoses: Patient Active Problem List   Diagnosis Date Noted   Stool incontinence 05/12/2019   Urinary incontinence 05/12/2019   Age-related physical debility 05/12/2019   Hyperlipidemia 05/12/2019   Acute pain of right lower extremity 05/08/2019   Tachycardia 05/07/2019   Aspiration pneumonia (HCC)    Elevated troponin I level    Thrombocytosis    Leukocytosis    Community acquired pneumonia    UTI (urinary tract infection) 11/20/2016   Acute lower UTI 11/20/2016   Gout attack 11/20/2016   Essential hypertension 11/20/2016    Orientation RESPIRATION BLADDER Height & Weight     Self  Normal Continent, External catheter Weight: 154 lb 5.2 oz (70 kg) Height:  6' (182.9 cm)  BEHAVIORAL SYMPTOMS/MOOD NEUROLOGICAL BOWEL NUTRITION STATUS      Continent Diet (see discharge summary)  AMBULATORY STATUS COMMUNICATION OF NEEDS Skin   Extensive Assist Verbally Normal                       Personal Care Assistance Level of Assistance  Bathing, Feeding, Dressing, Total care Bathing Assistance: Maximum assistance Feeding assistance: Limited assistance Dressing Assistance: Maximum assistance Total Care Assistance: Maximum assistance   Functional Limitations Info  Sight, Hearing, Speech Sight Info:  Adequate Hearing Info: Adequate Speech Info: Impaired    SPECIAL CARE FACTORS FREQUENCY  PT (By licensed PT), OT (By licensed OT)     PT Frequency: min 4x weekly OT Frequency: min 4x weekly            Contractures Contractures Info: Not present    Additional Factors Info  Code Status, Allergies Code Status Info: dnr Allergies Info: NKA           Current Medications (08/11/2022):  This is the current hospital active medication list Current Facility-Administered Medications  Medication Dose Route Frequency Provider Last Rate Last Admin   0.9 %  sodium chloride infusion  250 mL Intravenous Continuous Chesley Noon, MD 10 mL/hr at 08/10/22 1858 Infusion Verify at 08/10/22 1858   Ampicillin-Sulbactam (UNASYN) 3 g in sodium chloride 0.9 % 100 mL IVPB  3 g Intravenous Q6H Barrie Folk, RPH 200 mL/hr at 08/11/22 0857 3 g at 08/11/22 0857   azithromycin (ZITHROMAX) 500 mg in sodium chloride 0.9 % 250 mL IVPB  500 mg Intravenous Q24H Mannam, Praveen, MD 250 mL/hr at 08/11/22 1036 500 mg at 08/11/22 1036   Chlorhexidine Gluconate Cloth 2 % PADS 6 each  6 each Topical Daily Mannam, Praveen, MD   6 each at 08/11/22 0900   enoxaparin (LOVENOX) injection 40 mg  40 mg Subcutaneous Q24H Mannam, Praveen, MD   40 mg at 08/10/22 2014   famotidine (PEPCID) tablet 20 mg  20 mg Per Tube BID Mannam, Praveen, MD   20 mg at 08/10/22  2325   hydrocortisone sodium succinate (SOLU-CORTEF) 100 MG injection 50 mg  50 mg Intravenous Q12H Mannam, Praveen, MD   50 mg at 08/11/22 1008   midazolam (VERSED) injection 1-2 mg  1-2 mg Intravenous Q1H PRN Chesley Noon, MD       Oral care mouth rinse  15 mL Mouth Rinse PRN Mannam, Praveen, MD       Oral care mouth rinse  15 mL Mouth Rinse PRN Leeroy Bock, MD       polyethylene glycol (MIRALAX / GLYCOLAX) packet 17 g  17 g Oral Daily PRN Mannam, Praveen, MD       potassium chloride 10 mEq in 100 mL IVPB  10 mEq Intravenous Q1 Hr x 3 Nazari, Walid A,  RPH 100 mL/hr at 08/11/22 1009 10 mEq at 08/11/22 1009     Discharge Medications: Please see discharge summary for a list of discharge medications.  Relevant Imaging Results:  Relevant Lab Results:   Additional Information SSN: [id number]  Darolyn Rua, LCSW

## 2022-08-11 NOTE — Progress Notes (Signed)
OT Cancellation Note  Patient Details Name: DENY ANZUALDA MRN: [id number] DOB: 05/06/1945   Cancelled Treatment:    Reason Eval/Treat Not Completed: Patient at procedure or test/ unavailable. Consult received, chart reviewed. Pt out of the room for procedure. Per chart, pt scheduled for MBSS. Will re-attempt at later date/time as pt is available.   Arman Filter., MPH, MS, OTR/L ascom [phone] 08/11/22, 8:20 AM

## 2022-08-11 NOTE — Progress Notes (Signed)
       CROSS COVER NOTE  NAME: Marc Smith MRN: [id number] DOB : [birth year]    Concern as stated by nurse / staff   good night, this pt is confused and is removing his safety mittens and putting at his foley. Can we please get a PRN or restraint order?  Also, he's NPO with a Pepcid per tube order. Can we please get an alternative IV order or would you like to dc?   MJ Awaiting your orders. Thanks      Pertinent findings on chart review: Chart reviewed including progress note and SLP note: Patient is n.p.o. due to high aspiration risk As history of dementia  Assessment and  Interventions   Assessment: Dementia with behavioral disturbance/mild delirium  Plan: Safety sitter with Haldol 0.5 as needed for delirium Deferring Pepcid dose until tomorrow at 10 AM in case patient passes swallow eval in the a.m. X

## 2022-08-11 NOTE — Evaluation (Signed)
Occupational Therapy Evaluation Patient Details Name: Marc Smith MRN: [id number] DOB: 1945-09-19 Today's Date: 08/11/2022   History of Present Illness Marc Smith is a 77 y.o man admitted to the ICU with aspiration pneumonia, sepsis and NSTEMI. Previously lived at the [facility] assisted living facility.   Clinical Impression   Pt was seen for OT evaluation this date and co-tx with PT for safety. Pt agreeable to session, expressive language deficits with slurred speech, does better with yes/no or short limited choice responses with questioning. Requires VC throughout session to redirect to task, as he attempts to hold onto and mess with IV, lines/leads. Pt required MIN A for bed mobility, stood with MINA +2 with VC for hand placement and took a few short steps to recliner. Requires setup and SBA for seated grooming, MAX A for LB ADL tasks. Pt presents to acute OT demonstrating impaired ADL performance and functional mobility 2/2 decreased cognition, coordination, safety, balance, and activity tolerance (See OT problem list for additional functional deficits). RN notified pt up in recliner with chair alarm set. Pt would benefit from skilled OT services to address noted impairments and functional limitations (see below for any additional details) in order to maximize safety and independence while minimizing falls risk and caregiver burden.     Recommendations for follow up therapy are one component of a multi-disciplinary discharge planning process, led by the attending physician.  Recommendations may be updated based on patient status, additional functional criteria and insurance authorization.   Assistance Recommended at Discharge Frequent or constant Supervision/Assistance  Patient can return home with the following A lot of help with walking and/or transfers;A lot of help with bathing/dressing/bathroom;Direct supervision/assist for medications management;Direct supervision/assist for financial  management;Help with stairs or ramp for entrance;Assist for transportation    Functional Status Assessment  Patient has had a recent decline in their functional status and demonstrates the ability to make significant improvements in function in a reasonable and predictable amount of time.  Equipment Recommendations  Other (comment) (defer to next venue of care)    Recommendations for Other Services       Precautions / Restrictions Precautions Precautions: Fall Restrictions Weight Bearing Restrictions: No      Mobility Bed Mobility Overal bed mobility: Needs Assistance Bed Mobility: Supine to Sit     Supine to sit: Min assist     General bed mobility comments: Min assist needed for back support when coming to sitting and moving legs off bed.    Transfers Overall transfer level: Needs assistance Equipment used: Rolling walker (2 wheels) Transfers: Bed to chair/wheelchair/BSC       Step pivot transfers: Min assist, +2 physical assistance     General transfer comment: Patient min assist +2 for sit<>stand, verbal cueing needed for knee extension. Impulsive on standing, shaky LE's.      Balance Overall balance assessment: Needs assistance Sitting-balance support: Feet supported, No upper extremity supported Sitting balance-Leahy Scale: Fair     Standing balance support: Reliant on assistive device for balance, During functional activity, Bilateral upper extremity supported Standing balance-Leahy Scale: Poor Standing balance comment: Patient requires cueing to extend knees when coming into standing. Impulsive in standing.                           ADL either performed or assessed with clinical judgement   ADL  General ADL Comments: Pt requires MOD-MAX A for LB ADL tasks, set up and SBA for seated grooming tasks     Vision         Perception     Praxis      Pertinent Vitals/Pain Pain  Assessment Pain Assessment: Faces Faces Pain Scale: Hurts a little bit Pain Location: R foot - pt identifies pain location but unable to verbalize how much pain Pain Intervention(s): Monitored during session     Hand Dominance     Extremity/Trunk Assessment Upper Extremity Assessment Upper Extremity Assessment: Overall WFL for tasks assessed;Difficult to assess due to impaired cognition   Lower Extremity Assessment Lower Extremity Assessment: Overall WFL for tasks assessed;Difficult to assess due to impaired cognition       Communication Communication Communication: HOH;Other (comment) (Difficulty enunciating words.)   Cognition Arousal/Alertness: Awake/alert Behavior During Therapy: Impulsive, Restless Overall Cognitive Status: No family/caregiver present to determine baseline cognitive functioning                                 General Comments: Patient continuing to pull on lines/leads. Able to redirect patient attention to other tasks.     General Comments       Exercises     Shoulder Instructions      Home Living Family/patient expects to be discharged to:: Private residence                                 Additional Comments: Patient poor historian, documentation states he lives at the [facility] assisted living facility. No family availible at time of eval to confirm.      Prior Functioning/Environment Prior Level of Function : Patient poor historian/Family not available;Needs assist             Mobility Comments: Patient relayed varying information on home set up during evaluation. Could not maintain his story. No family present to verify information given.          OT Problem List: Pain;Decreased cognition;Decreased safety awareness;Impaired balance (sitting and/or standing);Decreased knowledge of use of DME or AE;Decreased knowledge of precautions      OT Treatment/Interventions: Self-care/ADL training;Therapeutic  exercise;Therapeutic activities;Cognitive remediation/compensation;Patient/family education;DME and/or AE instruction;Balance training    OT Goals(Current goals can be found in the care plan section) Acute Rehab OT Goals Patient Stated Goal: leave hospital OT Goal Formulation: With patient Time For Goal Achievement: 08/25/22 Potential to Achieve Goals: Good ADL Goals Pt Will Perform Lower Body Dressing: with min assist;sit to/from stand;with mod assist Pt Will Transfer to Toilet: bedside commode;ambulating;with +2 assist;with min guard assist (LRAD, +2 for safety) Pt Will Perform Toileting - Clothing Manipulation and hygiene: sit to/from stand;with min guard assist;with set-up Additional ADL Goal #1: Pt will follow 2 step commands with MIN VC during ADL/mobility, 3/3 opportunities.  OT Frequency: Min 1X/week    Co-evaluation PT/OT/SLP Co-Evaluation/Treatment: Yes Reason for Co-Treatment: For patient/therapist safety PT goals addressed during session: Strengthening/ROM;Proper use of DME OT goals addressed during session: ADL's and self-care;Proper use of Adaptive equipment and DME      AM-PAC OT "6 Clicks" Daily Activity     Outcome Measure Help from another person eating meals?: None Help from another person taking care of personal grooming?: A Little Help from another person toileting, which includes using toliet, bedpan, or urinal?: A Lot Help from another person bathing (including  washing, rinsing, drying)?: A Lot Help from another person to put on and taking off regular upper body clothing?: A Little Help from another person to put on and taking off regular lower body clothing?: A Lot 6 Click Score: 16   End of Session Equipment Utilized During Treatment: Gait belt;Rolling walker (2 wheels) Nurse Communication: Mobility status  Activity Tolerance: Patient tolerated treatment well Patient left: in chair;with call bell/phone within reach;with chair alarm set  OT Visit  Diagnosis: Other abnormalities of gait and mobility (R26.89)                Time: 4782-9562 OT Time Calculation (min): 28 min Charges:  OT General Charges $OT Visit: 1 Visit OT Evaluation $OT Eval Moderate Complexity: 1 Mod OT Treatments $Self Care/Home Management : 8-22 mins  Arman Filter., MPH, MS, OTR/L ascom [phone] 08/11/22, 3:42 PM

## 2022-08-11 NOTE — Progress Notes (Signed)
Physical Therapy Treatment Patient Details Name: Marc Smith MRN: [id number] DOB: [birth year] Today's Date: 08/11/2022   History of Present Illness Marc Smith is a 77 y.o man admitted to the ICU with aspiration pneumonia, sepsis and NSTEMI. Previously lived at the [facility] assisted living facility.    PT Comments    Session was a co-treat with OT for safety and patient endurance. Patient laying in bed at start and agreeable to session. Bed mobility supine to sit with min assist. Sit<>stand min assist +2 for safety/equipment. Ambulated 3 steps with RW to chair and performed pivot with VC for sequencing. Patient impulsive throughout session and continuously grabs lines/leads. Redirection needed and intermittently successful. Patient left in chair with call bell and alarm set. Notified nursing of mobility status.   Recommendations for follow up therapy are one component of a multi-disciplinary discharge planning process, led by the attending physician.  Recommendations may be updated based on patient status, additional functional criteria and insurance authorization.  Follow Up Recommendations  Can patient physically be transported by private vehicle: No    Assistance Recommended at Discharge Frequent or constant Supervision/Assistance  Patient can return home with the following A lot of help with walking and/or transfers;A lot of help with bathing/dressing/bathroom;Assistance with cooking/housework;Assistance with feeding;Direct supervision/assist for medications management;Direct supervision/assist for financial management;Assist for transportation;Help with stairs or ramp for entrance   Equipment Recommendations  None recommended by PT    Recommendations for Other Services       Precautions / Restrictions Precautions Precautions: Fall Restrictions Weight Bearing Restrictions: No     Mobility  Bed Mobility Overal bed mobility: Needs Assistance Bed Mobility: Supine to Sit      Supine to sit: Min assist     General bed mobility comments: Min assist needed for back support when coming to sitting and moving legs off bed.    Transfers Overall transfer level: Needs assistance Equipment used: Rolling walker (2 wheels) Transfers: Bed to chair/wheelchair/BSC     Step pivot transfers: Min assist, +2 physical assistance       General transfer comment: Patient min assist +2 for sit<>stand, verbal cueing needed for knee extension. Impulsive on standing, shaky LE's.    Ambulation/Gait Ambulation/Gait assistance: Min assist, +2 physical assistance Gait Distance (Feet): 2 Feet Assistive device: Rolling walker (2 wheels) Gait Pattern/deviations: Step-to pattern, Decreased step length - right, Decreased step length - left, Knee flexed in stance - right, Knee flexed in stance - left, Shuffle, Trunk flexed, Narrow base of support       General Gait Details: Patient able to ambulate 2 feet to recliner with min assist +2. cueing needed for stepping and sequencing to sit.   Stairs             Wheelchair Mobility    Modified Rankin (Stroke Patients Only)       Balance Overall balance assessment: Needs assistance Sitting-balance support: Feet supported, No upper extremity supported Sitting balance-Leahy Scale: Fair     Standing balance support: Reliant on assistive device for balance, During functional activity, Bilateral upper extremity supported Standing balance-Leahy Scale: Poor Standing balance comment: Patient requires cueing to extend knees when coming into standing. Impulsive in standing.                            Cognition Arousal/Alertness: Awake/alert Behavior During Therapy: Impulsive, Restless Overall Cognitive Status: No family/caregiver present to determine baseline cognitive functioning  General Comments: Patient continuing to pull on lines/leads. Able to redirect patient  attention to other tasks.        Exercises General Exercises - Lower Extremity Long Arc Quad: AROM, Both, 10 reps, Seated Hip Flexion/Marching: AROM, Both, 5 reps, Seated    General Comments        Pertinent Vitals/Pain Pain Assessment Pain Assessment: No/denies pain    Home Living                          Prior Function            PT Goals (current goals can now be found in the care plan section) Acute Rehab PT Goals PT Goal Formulation: With patient Time For Goal Achievement: 08/24/22 Potential to Achieve Goals: Fair Progress towards PT goals: Progressing toward goals    Frequency    Min 2X/week      PT Plan Current plan remains appropriate    Co-evaluation PT/OT/SLP Co-Evaluation/Treatment: Yes Reason for Co-Treatment: For patient/therapist safety PT goals addressed during session: Strengthening/ROM;Proper use of DME OT goals addressed during session: ADL's and self-care;Proper use of Adaptive equipment and DME      AM-PAC PT "6 Clicks" Mobility   Outcome Measure  Help needed turning from your back to your side while in a flat bed without using bedrails?: A Little Help needed moving from lying on your back to sitting on the side of a flat bed without using bedrails?: A Little Help needed moving to and from a bed to a chair (including a wheelchair)?: A Lot Help needed standing up from a chair using your arms (e.g., wheelchair or bedside chair)?: A Little Help needed to walk in hospital room?: A Lot Help needed climbing 3-5 steps with a railing? : A Lot 6 Click Score: 15    End of Session Equipment Utilized During Treatment: Gait belt Activity Tolerance: Patient tolerated treatment well Patient left: in chair;with call bell/phone within reach;with chair alarm set Nurse Communication: Mobility status PT Visit Diagnosis: Unsteadiness on feet (R26.81);Other abnormalities of gait and mobility (R26.89);Muscle weakness (generalized)  (M62.81);Difficulty in walking, not elsewhere classified (R26.2)     Time: 0981-1914 PT Time Calculation (min) (ACUTE ONLY): 33 min  Charges:                        Malachi Carl, SPT   Malachi Carl 08/11/2022, 2:24 PM

## 2022-08-11 NOTE — Progress Notes (Addendum)
PROGRESS NOTE  Marc Smith    DOB: November 02, 1945, 77 y.o.  EXB:[id number]    Code Status: DNR   DOA: 08/09/2022   LOS: 2   Brief hospital course  Marc Smith is a 77 y.o. 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  They presented from nursing home to the ED on 08/09/2022 with AMS x 1 days.  In the ED, it was found that they had sepsis with hypoxia and respiratory distress so intubated.  Significant findings included  lactic acid 1.1 BUN/creatinine 41/1.60216 WBC 11.8, platelets 507. CT chest with airspace disease in the left lower lobe, patchy airspace opacities in the right lower lobe, thyroid nodule.  They were initially treated with hydrocortisone, and antibiotics..   Patient was admitted to medicine service for further workup and management of respiratory failure as outlined in detail below.  08/11/22 -improving  Assessment & Plan  Principal Problem:   Aspiration pneumonia (HCC)  Acute hypoxic respiratory failure secondary to aspiration pneumonia Sepsis present on admission Stable postextubation.  Now on room air - cultures returned resistant to unasyn. Change to CTX - continue azithromycin - SLP evaluation- recommend NPO status - palliative consulted to discuss GOC with family if patient does not improve to tolerating a diet - IVF - supportive care for secretions  UTI- good UOP - coverage as above.   Dementia- stable. Oriented to self -   Thrombocytosis, JAK2 positive Hold hydroxyurea for now  Body mass index is 20.93 kg/m.  VTE ppx: enoxaparin (LOVENOX) injection 40 mg Start: 08/09/22 2000 SCDs Start: 08/09/22 1004   Diet:     Diet   DIET DYS 2 Room service appropriate? Yes; Fluid consistency: Nectar Thick   Consultants: CCM Palliative   Subjective 08/11/22    Pt reports no complaints. He is confused to his situation or location.  Oriented to self. Denies pain or respiratory distress. Denies urinary concerns.    Objective   Vitals:   08/11/22 0400 08/11/22 0500 08/11/22 0600 08/11/22 0700  BP: 124/72 127/66 130/83   Pulse: 70 76 81   Resp: (!) 21 (!) 27 15   Temp: 98.4 F (36.9 C)     TempSrc: Axillary     SpO2: 92% 96% 90%   Weight:    70 kg  Height:        Intake/Output Summary (Last 24 hours) at 08/11/2022 0823 Last data filed at 08/11/2022 0400 Gross per 24 hour  Intake 912.33 ml  Output 1300 ml  Net -387.67 ml   Filed Weights   08/09/22 1131 08/10/22 0500 08/11/22 0700  Weight: 68.1 kg 70 kg 70 kg    Physical Exam:  General: awake, alert, NAD HEENT: atraumatic, clear conjunctiva, anicteric sclera, MMM, hard of hearing Respiratory: normal respiratory effort. Stertor and rhonchi present Cardiovascular: quick capillary refill, normal S1/S2, RRR, no JVD, murmurs Nervous: A&O x1. no gross focal neurologic deficits, normal speech Extremities: moves all equally, no edema, normal tone Skin: dry, intact, normal temperature, normal color. No rashes, lesions or ulcers on exposed skin Psychiatry: normal mood, congruent affect  Labs   I have personally reviewed the following labs and imaging studies CBC    Component Value Date/Time   WBC 14.0 (H) 08/11/2022 0530   RBC 3.96 (L) 08/11/2022 0530   HGB 10.5 (L) 08/11/2022 0530   HGB 11.5 (L) 02/10/2012 0444   HCT 33.0 (L) 08/11/2022 0530   HCT 31.6 (L)  02/10/2012 0444   PLT 444 (H) 08/11/2022 0530   PLT 410 02/10/2012 0444   MCV 83.3 08/11/2022 0530   MCV 86 02/10/2012 0444   MCH 26.5 08/11/2022 0530   MCHC 31.8 08/11/2022 0530   RDW 19.1 (H) 08/11/2022 0530   RDW 13.7 02/10/2012 0444   LYMPHSABS 2.0 08/09/2022 0659   LYMPHSABS 0.9 (L) 02/10/2012 0444   MONOABS 0.4 08/09/2022 0659   MONOABS 0.7 02/10/2012 0444   EOSABS 0.0 08/09/2022 0659   EOSABS 0.0 02/10/2012 0444   BASOSABS 0.0 08/09/2022 0659   BASOSABS 0.0 02/10/2012 0444      Latest  Ref Rng & Units 08/11/2022    5:30 AM 08/10/2022    4:06 AM 08/09/2022    6:59 AM  BMP  Glucose 70 - 99 mg/dL 161  096  045   BUN 8 - 23 mg/dL 29  34  41   Creatinine 0.61 - 1.24 mg/dL 4.09  8.11  9.14   Sodium 135 - 145 mmol/L 146  140  137   Potassium 3.5 - 5.1 mmol/L 3.1  3.7  3.6   Chloride 98 - 111 mmol/L 110  110  103   CO2 22 - 32 mmol/L 27  24  22    Calcium 8.9 - 10.3 mg/dL 8.3  7.6  8.0     CT CHEST ABDOMEN PELVIS W CONTRAST  Result Date: 08/09/2022 CLINICAL DATA:  Sepsis. EXAM: CT CHEST, ABDOMEN, AND PELVIS WITH CONTRAST TECHNIQUE: Multidetector CT imaging of the chest, abdomen and pelvis was performed following the standard protocol during bolus administration of intravenous contrast. RADIATION DOSE REDUCTION: This exam was performed according to the departmental dose-optimization program which includes automated exposure control, adjustment of the mA and/or kV according to patient size and/or use of iterative reconstruction technique. CONTRAST:  80mL OMNIPAQUE IOHEXOL 300 MG/ML  SOLN COMPARISON:  May 07, 2019. FINDINGS: CT CHEST FINDINGS Cardiovascular: Atherosclerosis of thoracic aorta is noted without aneurysm or dissection. Normal cardiac size. No pericardial effusion. Coronary artery calcifications are noted. Mediastinum/Nodes: Endotracheal tube is in grossly good position. Esophagus is unremarkable. No adenopathy is noted. 1.6 cm left thyroid nodule is noted. Lungs/Pleura: No pneumothorax or pleural effusion is noted. Large left lower lobe airspace opacity is noted concerning for pneumonia. Multiple patchy opacities are noted in right lower lobe concerning for pneumonia as well. Multiple small nodular opacities are noted in right upper lobe posteriorly most consistent with infection. Musculoskeletal: Old right rib fractures are noted. No acute osseous abnormality is noted. CT ABDOMEN PELVIS FINDINGS Hepatobiliary: No cholelithiasis or biliary dilatation is noted. Right hepatic cyst  is noted. Pancreas: Unremarkable. No pancreatic ductal dilatation or surrounding inflammatory changes. Spleen: Normal in size without focal abnormality. Adrenals/Urinary Tract: Adrenal glands appear normal. Right renal cyst is noted for which no further follow-up is required. No hydronephrosis or renal obstruction is noted. Urinary bladder is decompressed secondary to Foley catheter. Stomach/Bowel: Stomach is unremarkable. No small bowel dilatation is noted. The appendix is not clearly visualized. Large amount of stool seen in the distal sigmoid colon and rectum concerning for impaction. No bowel inflammation is noted. Vascular/Lymphatic: Aortic atherosclerosis. No enlarged abdominal or pelvic lymph nodes. Reproductive: Prostate is unremarkable. Other: No abdominal wall hernia or abnormality. No abdominopelvic ascites. Musculoskeletal: No acute or significant osseous findings. IMPRESSION: Large airspace opacity is seen in left lower lobe consistent with pneumonia. Multiple patchy airspace opacities are noted in right lower lobe most consistent with pneumonia. Cluster of small nodules is noted  posteriorly in the right lower lobe most likely representing atypical infection such as mycobacterium, or short-term follow-up chest CT in 2-3 weeks is recommended to ensure resolution or stability and rule out underlying malignancy. 1.6 cm left thyroid nodule. Recommend thyroid US. (Ref: J Am Coll Radiol. 2015 Feb;12(2): 143-50). Large amount of stool seen in distal sigmoid colon and rectum concerning for impaction. Coronary artery calcifications are noted suggesting coronary artery disease. Aortic Atherosclerosis (ICD10-I70.0). Electronically Signed   By: Lupita Raider M.D.   On: 08/09/2022 09:04   CT Head Wo Contrast  Result Date: 08/09/2022 CLINICAL DATA:  Mental status change, unknown cause EXAM: CT HEAD WITHOUT CONTRAST TECHNIQUE: Contiguous axial images were obtained from the base of the skull through the vertex  without intravenous contrast. RADIATION DOSE REDUCTION: This exam was performed according to the departmental dose-optimization program which includes automated exposure control, adjustment of the mA and/or kV according to patient size and/or use of iterative reconstruction technique. COMPARISON:  CT head 11/20/2016. FINDINGS: Brain: No evidence of acute infarction, hemorrhage, hydrocephalus, extra-axial collection or mass lesion/mass effect. Severe confluent white matter hypodensities, nonspecific but likely related to chronic microvascular ischemic disease. Vascular: No hyperdense vessel. Skull: No acute fracture. Sinuses/Orbits: Mild paranasal sinus mucosal thickening. No acute orbital findings. Other: No mastoid effusions. IMPRESSION: 1. No evidence of acute intracranial abnormality. 2. Severe chronic microvascular ischemic disease. Electronically Signed   By: Feliberto Harts M.D.   On: 08/09/2022 09:04    Disposition Plan & Communication  Patient status: Inpatient  Admitted From: Home Planned disposition location: Skilled nursing facility Anticipated discharge date: 6/22 pending clinical improvement  Family Communication: none at bedside    Author: Leeroy Bock, DO [facility] 08/11/2022, 8:23 AM   Available by Epic secure chat 7AM-7PM. If 7PM-7AM, please contact night-coverage.  [facility] contact information found on [email or web address].

## 2022-08-11 NOTE — TOC Initial Note (Signed)
Transition of Care [facility]) - Initial/Assessment Note    Patient Details  Name: Marc Smith MRN: [id number] Date of Birth: [birth year]  Transition of Care [facility]) CM/SW Contact:    Darolyn Rua, LCSW Phone Number: 08/11/2022, 10:37 AM  Clinical Narrative:                  CSW notes patient is disoriented, called patients nephew [name] who reports he identifies as Management consultant. He is aware of PT recs of SNF, reports being in agreement for patient to go to short term rehab prior to returning to the [facility]. Agreeable for referrals to be sent out to snf for bed offers.    Expected Discharge Plan:  (TBD) Barriers to Discharge: Continued Medical Work up   Patient Goals and CMS Choice Patient states their goals for this hospitalization and ongoing recovery are:: to go home          Expected Discharge Plan and Services     Post Acute Care Choice:  (TBD) Living arrangements for the past 2 months: Assisted Living Facility                                      Prior Living Arrangements/Services Living arrangements for the past 2 months: Assisted Living Facility Lives with:: Facility Resident Patient language and need for interpreter reviewed:: Yes        Need for Family Participation in Patient Care: Yes (Comment) Care giver support system in place?: Yes (comment) Current home services: DME Criminal Activity/Legal Involvement Pertinent to Current Situation/Hospitalization: No - Comment as needed  Activities of Daily Living Home Assistive Devices/Equipment: Wheelchair ADL Screening (condition at time of admission) Patient's cognitive ability adequate to safely complete daily activities?: No Patient able to express need for assistance with ADLs?: No Independently performs ADLs?: No Does the patient have difficulty walking or climbing stairs?: Yes Weakness of Legs: Both Weakness of Arms/Hands: Both  Permission Sought/Granted                  Emotional  Assessment       Orientation: : Oriented to Self, Oriented to Place, Oriented to  Time Alcohol / Substance Use: Not Applicable Psych Involvement: No (comment)  Admission diagnosis:  Aspiration pneumonia (HCC) [J69.0] NSTEMI (non-ST elevated myocardial infarction) (HCC) [I21.4] Aspiration pneumonia of both lungs, unspecified aspiration pneumonia type, unspecified part of lung (HCC) [J69.0] Sepsis with acute hypoxic respiratory failure and septic shock, due to unspecified organism (HCC) [A41.9, R65.21, J96.01] Patient Active Problem List   Diagnosis Date Noted   Stool incontinence 05/12/2019   Urinary incontinence 05/12/2019   Age-related physical debility 05/12/2019   Hyperlipidemia 05/12/2019   Acute pain of right lower extremity 05/08/2019   Tachycardia 05/07/2019   Aspiration pneumonia (HCC)    Elevated troponin I level    Thrombocytosis    Leukocytosis    Community acquired pneumonia    UTI (urinary tract infection) 11/20/2016   Acute lower UTI 11/20/2016   Gout attack 11/20/2016   Essential hypertension 11/20/2016   PCP:  [employer]:   [facility] - [place], [place] - [address] [place] [address] Phone: [phone] Fax: [phone]  [facility] [place], [place] - [address] [place] [address] [place] [address] Phone: [phone] Fax: [phone]     Social Determinants  of Health (SDOH) Social History: SDOH Screenings   Food Insecurity: No Food Insecurity (07/22/2022)  Housing: Patient Unable To Answer (07/22/2022)  Transportation Needs: No Transportation Needs (07/22/2022)  Utilities: Not At Risk (07/22/2022)  Depression (PHQ2-9): Low Risk  (07/22/2022)  Tobacco Use: High Risk (08/09/2022)   SDOH Interventions:     Readmission Risk Interventions     No data to display

## 2022-08-12 DIAGNOSIS — Z7189 Other specified counseling: Secondary | ICD-10-CM

## 2022-08-12 DIAGNOSIS — Z515 Encounter for palliative care: Secondary | ICD-10-CM

## 2022-08-12 DIAGNOSIS — J690 Pneumonitis due to inhalation of food and vomit: Secondary | ICD-10-CM | POA: Diagnosis not present

## 2022-08-12 DIAGNOSIS — A419 Sepsis, unspecified organism: Secondary | ICD-10-CM | POA: Diagnosis not present

## 2022-08-12 DIAGNOSIS — R6521 Severe sepsis with septic shock: Secondary | ICD-10-CM | POA: Diagnosis not present

## 2022-08-12 DIAGNOSIS — I214 Non-ST elevation (NSTEMI) myocardial infarction: Secondary | ICD-10-CM | POA: Diagnosis not present

## 2022-08-12 LAB — GLUCOSE, CAPILLARY
Glucose-Capillary: 69 mg/dL — ABNORMAL LOW (ref 70–99)
Glucose-Capillary: 69 mg/dL — ABNORMAL LOW (ref 70–99)
Glucose-Capillary: 83 mg/dL (ref 70–99)
Glucose-Capillary: 85 mg/dL (ref 70–99)
Glucose-Capillary: 87 mg/dL (ref 70–99)
Glucose-Capillary: 87 mg/dL (ref 70–99)

## 2022-08-12 LAB — CBC
HCT: 35.7 % — ABNORMAL LOW (ref 39.0–52.0)
Hemoglobin: 11.7 g/dL — ABNORMAL LOW (ref 13.0–17.0)
MCH: 26.7 pg (ref 26.0–34.0)
MCHC: 32.8 g/dL (ref 30.0–36.0)
MCV: 81.3 fL (ref 80.0–100.0)
Platelets: 550 10*3/uL — ABNORMAL HIGH (ref 150–400)
RBC: 4.39 MIL/uL (ref 4.22–5.81)
RDW: 18.6 % — ABNORMAL HIGH (ref 11.5–15.5)
WBC: 13.1 10*3/uL — ABNORMAL HIGH (ref 4.0–10.5)
nRBC: 0 % (ref 0.0–0.2)

## 2022-08-12 LAB — BASIC METABOLIC PANEL
Anion gap: 9 (ref 5–15)
BUN: 20 mg/dL (ref 8–23)
CO2: 27 mmol/L (ref 22–32)
Calcium: 8.2 mg/dL — ABNORMAL LOW (ref 8.9–10.3)
Chloride: 105 mmol/L (ref 98–111)
Creatinine, Ser: 0.65 mg/dL (ref 0.61–1.24)
GFR, Estimated: >60 mL/min (ref >60)
Glucose, Bld: 98 mg/dL (ref 70–99)
Potassium: 3 mmol/L — ABNORMAL LOW (ref 3.5–5.1)
Sodium: 141 mmol/L (ref 135–145)

## 2022-08-12 LAB — CULTURE, BLOOD (ROUTINE X 2)

## 2022-08-12 MED ORDER — POTASSIUM CHLORIDE 10 MEQ/100ML IV SOLN
10.0000 meq | INTRAVENOUS | Status: AC
  Administered 2022-08-12 (×6): 10 meq via INTRAVENOUS
  Filled 2022-08-12 (×6): qty 100

## 2022-08-12 MED ORDER — HYDROCORTISONE SOD SUC (PF) 100 MG IJ SOLR
50.0000 mg | Freq: Every day | INTRAMUSCULAR | Status: DC
  Administered 2022-08-13: 50 mg via INTRAVENOUS
  Filled 2022-08-12: qty 1

## 2022-08-12 MED ORDER — NICOTINE 7 MG/24HR TD PT24
7.0000 mg | MEDICATED_PATCH | Freq: Every day | TRANSDERMAL | Status: DC
  Administered 2022-08-12 – 2022-08-24 (×13): 7 mg via TRANSDERMAL
  Filled 2022-08-12 (×13): qty 1

## 2022-08-12 MED ORDER — HALOPERIDOL LACTATE 5 MG/ML IJ SOLN
0.5000 mg | Freq: Two times a day (BID) | INTRAMUSCULAR | Status: DC | PRN
  Administered 2022-08-14 (×2): 0.5 mg via INTRAVENOUS
  Filled 2022-08-12 (×2): qty 1

## 2022-08-12 NOTE — Progress Notes (Addendum)
PROGRESS NOTE  Marc Smith    DOB: Jul 06, 1945, 77 y.o.  ZOX:[id number]    Code Status: DNR   DOA: 08/09/2022   LOS: 3   Brief hospital course  Marc Smith is a 77 y.o. 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  They presented from nursing home to the ED on 08/09/2022 with AMS x 1 days.  In the ED, it was found that they had sepsis with hypoxia and respiratory distress so intubated.  Significant findings included  lactic acid 1.1 BUN/creatinine 41/1.60216 WBC 11.8, platelets 507. CT chest with airspace disease in the left lower lobe, patchy airspace opacities in the right lower lobe, thyroid nodule.  They were initially treated with hydrocortisone, and antibiotics..   Patient was admitted to medicine service for further workup and management of respiratory failure as outlined in detail below.  6/20- weaned to room air. Barium swallow study completed. SLP evaluated and due to high risk of aspiration, recommended NPO. Palliative consulted to discuss GOC.   08/12/22 - stable. Remains NPO. Respiratory status has improved today. Palliative has initiated GOC discussions with family members and family meeting planned for tomorrow morning. Overnight, he had increased delirium requiring tele-sitter and mittens as well as haldol. He did not sustain injuries.   Assessment & Plan  Principal Problem:   Aspiration pneumonia (HCC) Active Problems:   Sepsis with acute hypoxic respiratory failure and septic shock (HCC)   NSTEMI (non-ST elevated myocardial infarction) (HCC)  Acute hypoxic respiratory failure secondary to aspiration pneumonia Sepsis present on admission Stable postextubation.  Now on room air - cultures returned resistant to unasyn. Change to CTX 6/20 - continue azithromycin - SLP evaluation- recommend NPO status - palliative consulted to discuss GOC with family  if patient does not improve to tolerating a diet - IVF - supportive care for secretions - wean from stress-dose steroids  UTI- good UOP - coverage as above.   Dementia- stable. Oriented to self  Thrombocytosis, JAK2 positive- platelets 550 Hold hydroxyurea for now  Body mass index is 19.91 kg/m.  VTE ppx: enoxaparin (LOVENOX) injection 40 mg Start: 08/09/22 2000 SCDs Start: 08/09/22 1004   Diet:     Diet   Diet NPO time specified   Consultants: CCM Palliative   Subjective 08/12/22    Pt reports no complaints. He is confused to his situation or location. Oriented to self. Denies pain or respiratory distress. Denies urinary concerns.    Objective   Vitals:   08/11/22 1600 08/11/22 2039 08/12/22 0456 08/12/22 0500  BP: (!) 147/82 (!) 155/88 (!) 156/97 (!) 153/84  Pulse:  75 71   Resp:  20 20   Temp:  98.2 F (36.8 C) (!) 97.5 F (36.4 C)   TempSrc:  Axillary Oral   SpO2:  93% 95%   Weight:    66.6 kg  Height:        Intake/Output Summary (Last 24 hours) at 08/12/2022 0743 Last data filed at 08/12/2022 0500 Gross per 24 hour  Intake 1245.84 ml  Output 2800 ml  Net -1554.16 ml    Filed Weights   08/10/22 0500 08/11/22 0700 08/12/22 0500  Weight: 70 kg 70 kg 66.6 kg    Physical Exam:  General: awake, alert, NAD. Falls asleep if not actively engaged in conversation HEENT: atraumatic, clear conjunctiva, anicteric sclera, MMM, hard of hearing Respiratory: normal respiratory effort. Stertor and rhonchi present Cardiovascular: quick capillary refill,  normal S1/S2, RRR, no JVD, murmurs Nervous: A&O x1. no gross focal neurologic deficits, normal speech Extremities: moves all equally, no edema, normal tone Skin: dry, intact, normal temperature, normal color. No rashes, lesions or ulcers on exposed skin Psychiatry: normal mood, congruent affect  Labs   I have personally reviewed the following labs and imaging studies CBC    Component Value Date/Time   WBC  13.1 (H) 08/12/2022 0403   RBC 4.39 08/12/2022 0403   HGB 11.7 (L) 08/12/2022 0403   HGB 11.5 (L) 02/10/2012 0444   HCT 35.7 (L) 08/12/2022 0403   HCT 31.6 (L) 02/10/2012 0444   PLT 550 (H) 08/12/2022 0403   PLT 410 02/10/2012 0444   MCV 81.3 08/12/2022 0403   MCV 86 02/10/2012 0444   MCH 26.7 08/12/2022 0403   MCHC 32.8 08/12/2022 0403   RDW 18.6 (H) 08/12/2022 0403   RDW 13.7 02/10/2012 0444   LYMPHSABS 2.0 08/09/2022 0659   LYMPHSABS 0.9 (L) 02/10/2012 0444   MONOABS 0.4 08/09/2022 0659   MONOABS 0.7 02/10/2012 0444   EOSABS 0.0 08/09/2022 0659   EOSABS 0.0 02/10/2012 0444   BASOSABS 0.0 08/09/2022 0659   BASOSABS 0.0 02/10/2012 0444      Latest Ref Rng & Units 08/12/2022    4:03 AM 08/11/2022    5:30 AM 08/10/2022    4:06 AM  BMP  Glucose 70 - 99 mg/dL 98  409  811   BUN 8 - 23 mg/dL 20  29  34   Creatinine 0.61 - 1.24 mg/dL 9.14  7.82  9.56   Sodium 135 - 145 mmol/L 141  146  140   Potassium 3.5 - 5.1 mmol/L 3.0  3.1  3.7   Chloride 98 - 111 mmol/L 105  110  110   CO2 22 - 32 mmol/L 27  27  24    Calcium 8.9 - 10.3 mg/dL 8.2  8.3  7.6     DG Swallowing Func-Speech Pathology  Result Date: 08/11/2022 Table formatting from the original result was not included. Modified Barium Swallow Study Patient Details Name: Marc Smith MRN: [id number] Date of Birth: 07-10-1945 Today's Date: 08/11/2022 HPI/PMH: HPI: 78 year old nursing home resident with history of hyperlipidemia, hypertension, depression, gout, prior aspiration, essential thrombocytosis (JAK2 positive, on hydroxyurea) presenting with altered mental status, hypoxic respiratory failure, fevers and sepsis. Intubated in ED d/t acute hypoxic respiratory failure secondary to aspiration pneumonia. Extubated this AM- now on 4L O2. CT Chest 08/09/22: "Large airspace opacity is seen in left lower lobe consistent with pneumonia. Multiple patchy airspace opacities are noted in right lower lobe most consistent with pneumonia." Head CT  08/09/22: "No evidence of acute intracranial abnormality. Severe chronic microvascular ischemic disease." Pt is currently NPO awaiting swallow eval. Clinical Impression: Clinical Impression: Pt presents with severe oropharyngeal dysphagia. Pharyngeal phase c/b reduced hyolaryngeal elevation/excursion, laryngeal vestibule closure and delayed swallow initiation to the pyriform sinuses. Stated deficits leading to aspiration of nectar and thin liquids during the swallow and aspiration of honey thick liquids before the swallow. Pt was inconsistently sensate to aspiration, with delayed, weak cough leading to ineffective airway clearance. Oral phase was disorganized for liquids, leading to anterior loss from the oral cavity and posterior loss to the pharynx with liquids. Liquid oral residue noted to spill to the pharynx leading to additional instance of aspiration subsequently during solid preparation. Oral phase for solids was significantly prolonged, with eventual partial clearance and loss to the pharynx. Given risk for aspiration based on  stated oropharyngeal impairments, observed aspiration across liquid consistencies, and overall health/deconditioning/hx of recurrent PNA, no "safe" diet can be recommended at this time. Recommend NPO with medications administered alternatively. SLP will follow up with family for education for results of assessment and recommendations. MD and RN aware of recommendations. Factors that may increase risk of adverse event in presence of aspiration Rubye Oaks & Clearance Coots 2021): Factors that may increase risk of adverse event in presence of aspiration Rubye Oaks & Clearance Coots 2021): Poor general health and/or compromised immunity; Reduced cognitive function; Frail or deconditioned; Dependence for feeding and/or oral hygiene; Inadequate oral hygiene; Reduced saliva; Weak cough; Aspiration of thick, dense, and/or acidic materials Recommendations/Plan: Swallowing Evaluation Recommendations Swallowing  Evaluation Recommendations Recommendations: NPO Treatment Plan Treatment Plan Treatment recommendations: Therapy as outlined in treatment plan below Follow-up recommendations: Follow physicians's recommendations for discharge plan and follow up therapies Functional status assessment: Patient has had a recent decline in their functional status and/or demonstrates limited ability to make significant improvements in function in a reasonable and predictable amount of time. Treatment frequency: Min 2x/week Treatment duration: 1 week Interventions: Aspiration precaution training; Patient/family education Recommendations Recommendations for follow up therapy are one component of a multi-disciplinary discharge planning process, led by the attending physician.  Recommendations may be updated based on patient status, additional functional criteria and insurance authorization. Assessment: Orofacial Exam: Orofacial Exam Oral Cavity: Oral Hygiene: Dried secretions (sticky, stringy) Oral Cavity - Dentition: Poor condition; Missing dentition Orofacial Anatomy: WFL Oral Motor/Sensory Function: WFL Anatomy: Anatomy: WFL Boluses Administered: Boluses Administered Boluses Administered: Thin liquids (Level 0); Mildly thick liquids (Level 2, nectar thick); Moderately thick liquids (Level 3, honey thick); Puree; Solid  Oral Impairment Domain: Oral Impairment Domain Lip Closure: Escape progressing to mid-chin (x1 with thin liquid cup) Tongue control during bolus hold: Posterior escape of greater than half of bolus Bolus preparation/mastication: Disorganized chewing/mashing with solid pieces of bolus unchewed Bolus transport/lingual motion: Repetitive/disorganized tongue motion Oral residue: Residue collection on oral structures Location of oral residue : Tongue; Lateral sulci Initiation of pharyngeal swallow : Pyriform sinuses  Pharyngeal Impairment Domain: Pharyngeal Impairment Domain Soft palate elevation: No bolus between soft palate  (SP)/pharyngeal wall (PW) Laryngeal elevation: Partial superior movement of thyroid cartilage/partial approximation of arytenoids to epiglottic petiole Anterior hyoid excursion: Partial anterior movement Epiglottic movement: Complete inversion Laryngeal vestibule closure: Incomplete, narrow column air/contrast in laryngeal vestibule Pharyngeal stripping wave : Present - complete Pharyngeal contraction (A/P view only): N/A Pharyngoesophageal segment opening: Complete distension and complete duration, no obstruction of flow Tongue base retraction: No contrast between tongue base and posterior pharyngeal wall (PPW) Pharyngeal residue: Complete pharyngeal clearance Location of pharyngeal residue: N/A  Esophageal Impairment Domain: Esophageal Impairment Domain Esophageal clearance upright position: Complete clearance, esophageal coating Pill: Pill Consistency administered: -- (N/A) Penetration/Aspiration Scale Score: Penetration/Aspiration Scale Score 1.  Material does not enter airway: Puree; Solid 7.  Material enters airway, passes BELOW cords and not ejected out despite cough attempt by patient: Mildly thick liquids (Level 2, nectar thick); Thin liquids (Level 0) 8.  Material enters airway, passes BELOW cords without attempt by patient to eject out (silent aspiration) : Moderately thick liquids (Level 3, honey thick) Compensatory Strategies: Compensatory Strategies Compensatory strategies: No (unable to cognitively complete strategies)   General Information: Caregiver present: No  Diet Prior to this Study: Dysphagia 2 (finely chopped); Mildly thick liquids (Level 2, nectar thick)   Temperature : Normal (WBC 14.0 trending up)   Respiratory Status: WFL   Supplemental O2: None (Room  air)   History of Recent Intubation: Yes  Behavior/Cognition: Cooperative; Alert; Distractible Self-Feeding Abilities: Dependent for feeding; Needs hand-over-hand assist for feeding Baseline vocal quality/speech: Normal Volitional Cough: Able  to elicit (weak) Volitional Swallow: Unable to elicit No data recorded Goal Planning: Prognosis for improved oropharyngeal function: Guarded Barriers to Reach Goals: Cognitive deficits; Time post onset; Severity of deficits; Overall medical prognosis No data recorded Patient/Family Stated Goal: none stated Consulted and agree with results and recommendations: Nurse; Physician Pain: Pain Assessment Pain Assessment: No/denies pain Breathing: 0 Negative Vocalization: 0 Facial Expression: 0 Body Language: 0 Consolability: 0 PAINAD Score: 0 Facial Expression: 0 Body Movements: 0 Muscle Tension: 0 Compliance with ventilator (intubated pts.): 0 Vocalization (extubated pts.): N/A CPOT Total: 0 End of Session: Start Time:SLP Start Time (ACUTE ONLY): 0810 Stop Time: SLP Stop Time (ACUTE ONLY): 0900 Time Calculation:SLP Time Calculation (min) (ACUTE ONLY): 50 min Charges: SLP Evaluations $ SLP Speech Visit: 1 Visit SLP Evaluations $BSS Swallow: 1 Procedure $MBS Swallow: 1 Procedure SLP visit diagnosis: SLP Visit Diagnosis: Dysphagia, oropharyngeal phase (R13.12) Past Medical History: Past Medical History: Diagnosis Date  Depression   Gout   Per Patient's nephew  Hyperlipidemia   Patient's nephew  Hypertension  Past Surgical History: No past surgical history on file. [place] J Clapp 08/11/2022, 9:34 AM   Disposition Plan & Communication  Patient status: Inpatient  Admitted From: Home Planned disposition location: Skilled nursing facility Anticipated discharge date: 6/24 pending clinical improvement  Family Communication: niece and nephew at bedside    Author: Leeroy Bock, DO [facility] 08/12/2022, 7:43 AM   Available by Epic secure chat 7AM-7PM. If 7PM-7AM, please contact night-coverage.  TR[facility] contact information found on [email or web address].

## 2022-08-12 NOTE — Plan of Care (Signed)

## 2022-08-12 NOTE — TOC Progression Note (Signed)
Transition of Care [facility]) - Progression Note    Patient Details  Name: Marc Smith MRN: [id number] Date of Birth: June 11, 1945  [facility]) CM/SW Contact  Kreg Shropshire, RN Phone Number: 08/12/2022, 10:32 AM  Clinical Narrative:    1021: Spoke with nephew regarding bed offers. [facility]. Cm sent secure message to Lajean Manes admission coordinator asking what policy regarding restraints. She stated has to be off restraints for 72 hours before admission.   Expected Discharge Plan:  (TBD) Barriers to Discharge: Continued Medical Work up  Expected Discharge Plan and Services     Post Acute Care Choice:  (TBD) Living arrangements for the past 2 months: Assisted Living Facility                                       Social Determinants of Health (SDOH) Interventions SDOH Screenings   Food Insecurity: No Food Insecurity (07/22/2022)  Housing: Patient Unable To Answer (07/22/2022)  Transportation Needs: No Transportation Needs (07/22/2022)  Utilities: Not At Risk (07/22/2022)  Depression (PHQ2-9): Low Risk  (07/22/2022)  Tobacco Use: High Risk (08/09/2022)    Readmission Risk Interventions     No data to display

## 2022-08-12 NOTE — Progress Notes (Signed)
Physical Therapy Treatment Patient Details Name: Marc Smith MRN: [id number] DOB: 07-10-1945 Today's Date: 08/12/2022   History of Present Illness Marc Smith is a 77 y.o man admitted to the ICU with aspiration pneumonia, sepsis and NSTEMI. Previously lived at the [facility] assisted living facility.    PT Comments    Patient received in bed, he is alert, agrees to get up and walk. Patient requires cues for mobility and pulling a little/fixated on catheter. Patient requires min guard,min A for bed mobility and sit to stand transfer. He requires cues for safety. Patient ambulated 12 feet around bed to recliner with min guard and RW. Patient will continue to benefit from skilled PT to improve strength and functional independence.     Recommendations for follow up therapy are one component of a multi-disciplinary discharge planning process, led by the attending physician.  Recommendations may be updated based on patient status, additional functional criteria and insurance authorization.  Follow Up Recommendations  Can patient physically be transported by private vehicle: No    Assistance Recommended at Discharge Frequent or constant Supervision/Assistance  Patient can return home with the following A little help with walking and/or transfers;A little help with bathing/dressing/bathroom;Assist for transportation;Assistance with feeding;Help with stairs or ramp for entrance;Assistance with cooking/housework;Direct supervision/assist for medications management   Equipment Recommendations  None recommended by PT    Recommendations for Other Services       Precautions / Restrictions Precautions Precautions: Fall Restrictions Weight Bearing Restrictions: No     Mobility  Bed Mobility Overal bed mobility: Modified Independent Bed Mobility: Supine to Sit     Supine to sit: Min assist     General bed mobility comments: Min assist needed for back support when coming to sitting and moving  legs off bed.    Transfers Overall transfer level: Needs assistance Equipment used: Rolling walker (2 wheels) Transfers: Sit to/from Stand     Step pivot transfers: Min assist, From elevated surface       General transfer comment: Cues to come forward when attempting to stand. Hand placement    Ambulation/Gait Ambulation/Gait assistance: Min guard Gait Distance (Feet): 12 Feet Assistive device: Rolling walker (2 wheels) Gait Pattern/deviations: Step-through pattern, Decreased step length - right, Decreased step length - left, Decreased stride length, Trunk flexed Gait velocity: decr     General Gait Details: Patient is moderately steady with ambulating using RW. No lob.   Stairs             Wheelchair Mobility    Modified Rankin (Stroke Patients Only)       Balance Overall balance assessment: Needs assistance, Mild deficits observed, not formally tested Sitting-balance support: Feet supported Sitting balance-Leahy Scale: Good     Standing balance support: Bilateral upper extremity supported, During functional activity, Reliant on assistive device for balance Standing balance-Leahy Scale: Fair                              Cognition Arousal/Alertness: Awake/alert Behavior During Therapy: Flat affect Overall Cognitive Status: No family/caregiver present to determine baseline cognitive functioning                                 General Comments: Patient continuing to pull on lines/leads. Able to redirect patient attention to other tasks. Verbalized some during treatment.        Exercises  General Comments        Pertinent Vitals/Pain Pain Assessment Breathing: normal Negative Vocalization: none Facial Expression: smiling or inexpressive Body Language: relaxed Consolability: no need to console PAINAD Score: 0    Home Living                          Prior Function            PT Goals (current  goals can now be found in the care plan section) Acute Rehab PT Goals Patient Stated Goal: unable to state at this time. PT Goal Formulation: With patient Time For Goal Achievement: 08/24/22 Progress towards PT goals: Progressing toward goals    Frequency    Min 2X/week      PT Plan Current plan remains appropriate    Co-evaluation              AM-PAC PT "6 Clicks" Mobility   Outcome Measure  Help needed turning from your back to your side while in a flat bed without using bedrails?: A Little Help needed moving from lying on your back to sitting on the side of a flat bed without using bedrails?: A Little Help needed moving to and from a bed to a chair (including a wheelchair)?: A Little Help needed standing up from a chair using your arms (e.g., wheelchair or bedside chair)?: A Little Help needed to walk in hospital room?: A Little Help needed climbing 3-5 steps with a railing? : A Lot 6 Click Score: 17    End of Session Equipment Utilized During Treatment: Gait belt Activity Tolerance: Patient tolerated treatment well;Patient limited by fatigue Patient left: in chair;with call bell/phone within reach;with nursing/sitter in room;with chair alarm set Nurse Communication: Mobility status PT Visit Diagnosis: Unsteadiness on feet (R26.81);Other abnormalities of gait and mobility (R26.89);Muscle weakness (generalized) (M62.81);Difficulty in walking, not elsewhere classified (R26.2)     Time: 4782-9562 PT Time Calculation (min) (ACUTE ONLY): 17 min  Charges:  $Gait Training: 8-22 mins                     Bernard Donahoo, PT, GCS 08/12/22,12:18 PM

## 2022-08-12 NOTE — Consult Note (Signed)
Consultation Note Date: 08/12/2022   Patient Name: Marc Smith  DOB: 01/26/1946  MRN: [id number]  Age / Sex: 77 y.o., male  PCP: [facility] Referring Physician: Leeroy Bock, MD  Reason for Consultation: Establishing goals of care   HPI/Brief Hospital Course: 77 y.o. 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 Smith [name] with oncology), HTN, HLD, dementia and gout admitted from [facility] on 08/09/2022 with altered mental status. Reports from facility, found to be febrile and minimally responsive prompting EMS to be called.  Found to have sepsis secondary to aspiration PNA, UTI, required intubation in ED, successfully extubated 6/19  Concern for recurrent aspiration, failed ST evaluation 6/20 remains NPO  Palliative medicine was consulted for assisting with goals of care conversations.  Subjective:  Extensive chart review has been completed prior to meeting patient including labs, vital signs, imaging, progress notes, orders, and available advanced directive documents from current and previous encounters.  Visited with Marc Smith at his bedside. Awake and alert, remains confused, oriented to self and place, unable to participate in goals of care conversations. Niece and nephew at bedside during time of visit.  Introduced myself as a Publishing rights manager as a member of the palliative care team. Explained palliative medicine is specialized medical care for people living with serious illness. It focuses on providing relief from the symptoms and stress of a serious illness. The goal is to improve quality of life for both the patient and the family.   Niece and nephew at bedside able to share a brief life review. Marc Smith has been a LTC resident at [facility] for about 3 years. He requires assistance with completing all ADL's and utilizes wheelchair when  OOB. He is typically awake and alert, able to communicate but has baseline confusion. He worked in [employer] for many years. He is not currently married and does not have any children. He has several nieces and nephews that care for him and Marc Smith is appointed HCPOA.  We discussed concern for recurrent aspiration. Reviewed assessment and recommendations from speech therapy visit 6/20-plan for reassessment today. Discussed if Marc Smith unable to pass evaluation today and remains NPO we will discuss pathways of considering alternative feeding options versus conversations around transitioning to comfort care. There is more family, other nieces and nephew that would like to be a part of this conversation. Plan has been set to meet with all family 6/22 AM to discuss goals of care allowing time for reassessment by ST.  Family also shares their concern for Marc Smith requesting a cigarette, they share he continues to smoke about 3-4 cigarettes per day. We discussed a nicotine patch for which they agreed.  I discussed importance of continued conversations with family/support persons and all members of their medical team regarding overall plan of care and treatment options ensuring decisions are in alignment with patients goals of care.  All questions/concerns addressed. Emotional support provided to patient/family/support persons. PMT will continue to follow and support patient as needed.  Objective: Primary Diagnoses: Present on Admission:  Aspiration pneumonia [facility])   Physical Exam Constitutional:      General: He is not in acute distress.    Appearance: He is ill-appearing.  Pulmonary:     Effort: Pulmonary effort is normal. No respiratory distress.  Skin:    General: Skin is warm and dry.  Neurological:     Mental Status: He is alert.  Motor: Weakness present.     Vital Signs: BP (!) 147/96   Pulse 72   Temp 98.5 F (36.9 C)   Resp 17   Ht 6' (1.829 m)   Wt 66.6 kg   SpO2 99%    BMI 19.91 kg/m  Pain Scale: PAINAD   Pain Score: 0-No pain  IO: Intake/output summary:  Intake/Output Summary (Last 24 hours) at 08/12/2022 1018 Last data filed at 08/12/2022 0913 Gross per 24 hour  Intake 1245.84 ml  Output 3400 ml  Net -2154.16 ml    LBM: Last BM Date : 08/11/22 Baseline Weight: Weight: 74.4 kg Most recent weight: Weight: 66.6 kg       Palliative Assessment/Data: 40%   Assessment and Plan  SUMMARY OF RECOMMENDATIONS   DNR/DNI ST reassessment today Nicotine patch-smokes 3-4 cigarettes per day Family meeting with all involved members 6/22 10 AM PMT to continue to follow for ongoing needs and support  Discussed With: Primary team   Thank you for this consult and allowing Palliative Medicine to participate in the care of Marc Smith. Palliative medicine will continue to follow and assist as needed.   Time Total: 55 minutes  Time spent includes: Detailed review of medical records (labs, imaging, vital signs), medically appropriate exam (mental status, respiratory, cardiac, skin), discussed with treatment team, counseling and educating patient, family and staff, documenting clinical information, medication management and coordination of care.   Signed by: Marc Deed, DNP, AGNP-C Palliative Medicine    Please contact Palliative Medicine Team phone at [phone] for questions and concerns.  For individual provider: See Marc Smith

## 2022-08-12 NOTE — Care Management Important Message (Signed)
Important Message  Patient Details  Name: Marc Smith MRN: [id number] Date of Birth: 1945-10-08   Medicare Important Message Given:  Yes     Johnell Comings 08/12/2022, 10:04 AM

## 2022-08-13 DIAGNOSIS — I214 Non-ST elevation (NSTEMI) myocardial infarction: Secondary | ICD-10-CM | POA: Diagnosis not present

## 2022-08-13 DIAGNOSIS — R6521 Severe sepsis with septic shock: Secondary | ICD-10-CM | POA: Diagnosis not present

## 2022-08-13 DIAGNOSIS — A419 Sepsis, unspecified organism: Secondary | ICD-10-CM | POA: Diagnosis not present

## 2022-08-13 DIAGNOSIS — Z7189 Other specified counseling: Secondary | ICD-10-CM | POA: Diagnosis not present

## 2022-08-13 DIAGNOSIS — J690 Pneumonitis due to inhalation of food and vomit: Secondary | ICD-10-CM | POA: Diagnosis not present

## 2022-08-13 LAB — CBC
HCT: 40 % (ref 39.0–52.0)
Hemoglobin: 13.1 g/dL (ref 13.0–17.0)
MCH: 26.9 pg (ref 26.0–34.0)
MCHC: 32.8 g/dL (ref 30.0–36.0)
MCV: 82.1 fL (ref 80.0–100.0)
Platelets: 702 10*3/uL — ABNORMAL HIGH (ref 150–400)
RBC: 4.87 MIL/uL (ref 4.22–5.81)
RDW: 18.5 % — ABNORMAL HIGH (ref 11.5–15.5)
WBC: 12.8 10*3/uL — ABNORMAL HIGH (ref 4.0–10.5)
nRBC: 0 % (ref 0.0–0.2)

## 2022-08-13 LAB — GLUCOSE, CAPILLARY
Glucose-Capillary: 108 mg/dL — ABNORMAL HIGH (ref 70–99)
Glucose-Capillary: 126 mg/dL — ABNORMAL HIGH (ref 70–99)
Glucose-Capillary: 130 mg/dL — ABNORMAL HIGH (ref 70–99)
Glucose-Capillary: 135 mg/dL — ABNORMAL HIGH (ref 70–99)
Glucose-Capillary: 58 mg/dL — ABNORMAL LOW (ref 70–99)
Glucose-Capillary: 74 mg/dL (ref 70–99)
Glucose-Capillary: 76 mg/dL (ref 70–99)
Glucose-Capillary: 90 mg/dL (ref 70–99)

## 2022-08-13 LAB — BASIC METABOLIC PANEL
Anion gap: 11 (ref 5–15)
BUN: 19 mg/dL (ref 8–23)
CO2: 27 mmol/L (ref 22–32)
Calcium: 8.6 mg/dL — ABNORMAL LOW (ref 8.9–10.3)
Chloride: 99 mmol/L (ref 98–111)
Creatinine, Ser: 0.64 mg/dL (ref 0.61–1.24)
GFR, Estimated: >60 mL/min (ref >60)
Glucose, Bld: 116 mg/dL — ABNORMAL HIGH (ref 70–99)
Potassium: 2.9 mmol/L — ABNORMAL LOW (ref 3.5–5.1)
Sodium: 137 mmol/L (ref 135–145)

## 2022-08-13 LAB — CULTURE, BLOOD (ROUTINE X 2): Special Requests: ADEQUATE

## 2022-08-13 MED ORDER — DEXTROSE 50 % IV SOLN: 1.0000 | Freq: Once | INTRAVENOUS | Status: AC

## 2022-08-13 MED ORDER — KCL IN DEXTROSE-NACL 40-5-0.9 MEQ/L-%-% IV SOLN
INTRAVENOUS | Status: AC
  Filled 2022-08-13 (×4): qty 1000

## 2022-08-13 MED ORDER — DEXTROSE 50 % IV SOLN
INTRAVENOUS | Status: AC
  Administered 2022-08-13: 50 mL via INTRAVENOUS
  Filled 2022-08-13: qty 50

## 2022-08-13 NOTE — TOC Progression Note (Addendum)
Transition of Care [facility]) - Progression Note    Patient Details  Name: Marc Smith MRN: [id number] Date of Birth: June 03, 1945  Transition of Care [facility]) CM/SW Contact  Kemper Durie, RN Phone Number: 08/13/2022, 11:50 AM  Clinical Narrative:     Notified that family is wanting further conversation regarding hospice.  Spoke to nephew [name], confirms there is no agency preference.  [facility] notified, Lanice Schwab will meet with family at the bedside.   Update 1540:  Family agrees to hospice, would like to look further into LTC with hospice support.  Contacted Tonya with [facility] to inquire about offering LTC bed, she was unable to agree at this time but will have nurse review case for admission on Monday.  Expected Discharge Plan:  (TBD) Barriers to Discharge: Continued Medical Work up  Expected Discharge Plan and Services     Post Acute Care Choice:  (TBD) Living arrangements for the past 2 months: Assisted Living Facility                                       Social Determinants of Health (SDOH) Interventions SDOH Screenings   Food Insecurity: No Food Insecurity (07/22/2022)  Housing: Patient Unable To Answer (07/22/2022)  Transportation Needs: No Transportation Needs (07/22/2022)  Utilities: Not At Risk (07/22/2022)  Depression (PHQ2-9): Low Risk  (07/22/2022)  Tobacco Use: High Risk (08/09/2022)    Readmission Risk Interventions     No data to display

## 2022-08-13 NOTE — Progress Notes (Signed)
PROGRESS NOTE  Marc Smith    DOB: 1945/12/14, 77 y.o.  DGL:[id number]    Code Status: DNR   DOA: 08/09/2022   LOS: 4   Brief hospital course  Marc Smith is a 77 y.o. 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  They presented from nursing home to the ED on 08/09/2022 with AMS x 1 days.  In the ED, it was found that they had sepsis with hypoxia and respiratory distress so intubated.  Significant findings included  lactic acid 1.1 BUN/creatinine 41/1.60216 WBC 11.8, platelets 507. CT chest with airspace disease in the left lower lobe, patchy airspace opacities in the right lower lobe, thyroid nodule.  They were initially treated with hydrocortisone, and antibiotics..   Patient was admitted to medicine service for further workup and management of respiratory failure as outlined in detail below.  6/20- weaned to room air. Barium swallow study completed. SLP evaluated and due to high risk of aspiration, recommended NPO. Palliative consulted to discuss GOC.  6/21:stable. Remains NPO. Respiratory status has improved. Palliative has initiated GOC discussions with family.Overnight, he had increased delirium requiring tele-sitter and mittens as well as haldol. He did not sustain injuries.   08/13/22 - stable. No overnight events. Family meeting with palliative today.  Assessment & Plan  Principal Problem:   Aspiration pneumonia (HCC) Active Problems:   Sepsis with acute hypoxic respiratory failure and septic shock (HCC)   NSTEMI (non-ST elevated myocardial infarction) (HCC)  Acute hypoxic respiratory failure secondary to aspiration pneumonia Sepsis present on admission Stable postextubation.  Now on room air - cultures returned resistant to unasyn. Change to CTX 6/20 - continue azithromycin - SLP evaluation- recommend NPO status - palliative consulted to discuss GOC  with family if patient does not improve to tolerating a diet - IVF - supportive care for secretions - wean from stress-dose steroids  Hypoglycemia- in setting of NPO - fluids with d5 continuous - glucose ampule PRN - regular CBG monitoring  UTI- good UOP - coverage as above.   Dementia- stable. Oriented to self  Thrombocytosis, JAK2 positive- platelets 550 Hold hydroxyurea for now  Body mass index is 22.35 kg/m.  VTE ppx: enoxaparin (LOVENOX) injection 40 mg Start: 08/09/22 2000 SCDs Start: 08/09/22 1004  Diet:     Diet   Diet NPO time specified   Consultants: CCM Palliative   Subjective 08/13/22    Pt reports no complaints. Denies pain or respiratory distress.   Objective   Vitals:   08/12/22 1522 08/12/22 2012 08/13/22 0425 08/13/22 0500  BP: (!) 150/91 (!) 164/89 (!) 151/94   Pulse: (!) 49 65 67   Resp: 14 18 16    Temp: 98 F (36.7 C) 98.1 F (36.7 C) 98 F (36.7 C)   TempSrc:   Oral   SpO2: 95% 97% 96%   Weight:    74.8 kg  Height:        Intake/Output Summary (Last 24 hours) at 08/13/2022 0744 Last data filed at 08/13/2022 0719 Gross per 24 hour  Intake 167.13 ml  Output 3050 ml  Net -2882.87 ml    Filed Weights   08/11/22 0700 08/12/22 0500 08/13/22 0500  Weight: 70 kg 66.6 kg 74.8 kg    Physical Exam:  General: awake, alert, NAD. Falls asleep if not actively engaged in conversation HEENT: atraumatic, clear conjunctiva, anicteric sclera, MMM, hard of hearing Respiratory: normal respiratory effort. Stertor and rhonchi present  Cardiovascular: quick capillary refill, normal S1/S2, RRR, no JVD, murmurs Nervous: A&O x1. no gross focal neurologic deficits, normal speech Extremities: moves all equally, no edema, normal tone Skin: dry, intact, normal temperature, normal color. No rashes, lesions or ulcers on exposed skin Psychiatry: normal mood, congruent affect  Labs   I have personally reviewed the following labs and imaging studies CBC     Component Value Date/Time   WBC 13.1 (H) 08/12/2022 0403   RBC 4.39 08/12/2022 0403   HGB 11.7 (L) 08/12/2022 0403   HGB 11.5 (L) 02/10/2012 0444   HCT 35.7 (L) 08/12/2022 0403   HCT 31.6 (L) 02/10/2012 0444   PLT 550 (H) 08/12/2022 0403   PLT 410 02/10/2012 0444   MCV 81.3 08/12/2022 0403   MCV 86 02/10/2012 0444   MCH 26.7 08/12/2022 0403   MCHC 32.8 08/12/2022 0403   RDW 18.6 (H) 08/12/2022 0403   RDW 13.7 02/10/2012 0444   LYMPHSABS 2.0 08/09/2022 0659   LYMPHSABS 0.9 (L) 02/10/2012 0444   MONOABS 0.4 08/09/2022 0659   MONOABS 0.7 02/10/2012 0444   EOSABS 0.0 08/09/2022 0659   EOSABS 0.0 02/10/2012 0444   BASOSABS 0.0 08/09/2022 0659   BASOSABS 0.0 02/10/2012 0444      Latest Ref Rng & Units 08/12/2022    4:03 AM 08/11/2022    5:30 AM 08/10/2022    4:06 AM  BMP  Glucose 70 - 99 mg/dL 98  324  401   BUN 8 - 23 mg/dL 20  29  34   Creatinine 0.61 - 1.24 mg/dL 0.27  2.53  6.64   Sodium 135 - 145 mmol/L 141  146  140   Potassium 3.5 - 5.1 mmol/L 3.0  3.1  3.7   Chloride 98 - 111 mmol/L 105  110  110   CO2 22 - 32 mmol/L 27  27  24    Calcium 8.9 - 10.3 mg/dL 8.2  8.3  7.6     DG Swallowing Func-Speech Pathology  Result Date: 08/11/2022 Table formatting from the original result was not included. Modified Barium Swallow Study Patient Details Name: Marc Smith MRN: [id number] Date of Birth: May 31, 1945 Today's Date: 08/11/2022 HPI/PMH: HPI: 77 year old nursing home resident with history of hyperlipidemia, hypertension, depression, gout, prior aspiration, essential thrombocytosis (JAK2 positive, on hydroxyurea) presenting with altered mental status, hypoxic respiratory failure, fevers and sepsis. Intubated in ED d/t acute hypoxic respiratory failure secondary to aspiration pneumonia. Extubated this AM- now on 4L O2. CT Chest 08/09/22: "Large airspace opacity is seen in left lower lobe consistent with pneumonia. Multiple patchy airspace opacities are noted in right lower lobe most  consistent with pneumonia." Head CT 08/09/22: "No evidence of acute intracranial abnormality. Severe chronic microvascular ischemic disease." Pt is currently NPO awaiting swallow eval. Clinical Impression: Clinical Impression: Pt presents with severe oropharyngeal dysphagia. Pharyngeal phase c/b reduced hyolaryngeal elevation/excursion, laryngeal vestibule closure and delayed swallow initiation to the pyriform sinuses. Stated deficits leading to aspiration of nectar and thin liquids during the swallow and aspiration of honey thick liquids before the swallow. Pt was inconsistently sensate to aspiration, with delayed, weak cough leading to ineffective airway clearance. Oral phase was disorganized for liquids, leading to anterior loss from the oral cavity and posterior loss to the pharynx with liquids. Liquid oral residue noted to spill to the pharynx leading to additional instance of aspiration subsequently during solid preparation. Oral phase for solids was significantly prolonged, with eventual partial clearance and loss to the pharynx. Given risk  for aspiration based on stated oropharyngeal impairments, observed aspiration across liquid consistencies, and overall health/deconditioning/hx of recurrent PNA, no "safe" diet can be recommended at this time. Recommend NPO with medications administered alternatively. SLP will follow up with family for education for results of assessment and recommendations. MD and RN aware of recommendations. Factors that may increase risk of adverse event in presence of aspiration Rubye Oaks & Clearance Coots 2021): Factors that may increase risk of adverse event in presence of aspiration Rubye Oaks & Clearance Coots 2021): Poor general health and/or compromised immunity; Reduced cognitive function; Frail or deconditioned; Dependence for feeding and/or oral hygiene; Inadequate oral hygiene; Reduced saliva; Weak cough; Aspiration of thick, dense, and/or acidic materials Recommendations/Plan: Swallowing  Evaluation Recommendations Swallowing Evaluation Recommendations Recommendations: NPO Treatment Plan Treatment Plan Treatment recommendations: Therapy as outlined in treatment plan below Follow-up recommendations: Follow physicians's recommendations for discharge plan and follow up therapies Functional status assessment: Patient has had a recent decline in their functional status and/or demonstrates limited ability to make significant improvements in function in a reasonable and predictable amount of time. Treatment frequency: Min 2x/week Treatment duration: 1 week Interventions: Aspiration precaution training; Patient/family education Recommendations Recommendations for follow up therapy are one component of a multi-disciplinary discharge planning process, led by the attending physician.  Recommendations may be updated based on patient status, additional functional criteria and insurance authorization. Assessment: Orofacial Exam: Orofacial Exam Oral Cavity: Oral Hygiene: Dried secretions (sticky, stringy) Oral Cavity - Dentition: Poor condition; Missing dentition Orofacial Anatomy: WFL Oral Motor/Sensory Function: WFL Anatomy: Anatomy: WFL Boluses Administered: Boluses Administered Boluses Administered: Thin liquids (Level 0); Mildly thick liquids (Level 2, nectar thick); Moderately thick liquids (Level 3, honey thick); Puree; Solid  Oral Impairment Domain: Oral Impairment Domain Lip Closure: Escape progressing to mid-chin (x1 with thin liquid cup) Tongue control during bolus hold: Posterior escape of greater than half of bolus Bolus preparation/mastication: Disorganized chewing/mashing with solid pieces of bolus unchewed Bolus transport/lingual motion: Repetitive/disorganized tongue motion Oral residue: Residue collection on oral structures Location of oral residue : Tongue; Lateral sulci Initiation of pharyngeal swallow : Pyriform sinuses  Pharyngeal Impairment Domain: Pharyngeal Impairment Domain Soft palate  elevation: No bolus between soft palate (SP)/pharyngeal wall (PW) Laryngeal elevation: Partial superior movement of thyroid cartilage/partial approximation of arytenoids to epiglottic petiole Anterior hyoid excursion: Partial anterior movement Epiglottic movement: Complete inversion Laryngeal vestibule closure: Incomplete, narrow column air/contrast in laryngeal vestibule Pharyngeal stripping wave : Present - complete Pharyngeal contraction (A/P view only): N/A Pharyngoesophageal segment opening: Complete distension and complete duration, no obstruction of flow Tongue base retraction: No contrast between tongue base and posterior pharyngeal wall (PPW) Pharyngeal residue: Complete pharyngeal clearance Location of pharyngeal residue: N/A  Esophageal Impairment Domain: Esophageal Impairment Domain Esophageal clearance upright position: Complete clearance, esophageal coating Pill: Pill Consistency administered: -- (N/A) Penetration/Aspiration Scale Score: Penetration/Aspiration Scale Score 1.  Material does not enter airway: Puree; Solid 7.  Material enters airway, passes BELOW cords and not ejected out despite cough attempt by patient: Mildly thick liquids (Level 2, nectar thick); Thin liquids (Level 0) 8.  Material enters airway, passes BELOW cords without attempt by patient to eject out (silent aspiration) : Moderately thick liquids (Level 3, honey thick) Compensatory Strategies: Compensatory Strategies Compensatory strategies: No (unable to cognitively complete strategies)   General Information: Caregiver present: No  Diet Prior to this Study: Dysphagia 2 (finely chopped); Mildly thick liquids (Level 2, nectar thick)   Temperature : Normal (WBC 14.0 trending up)   Respiratory Status: [facility]  Supplemental O2: None (Room air)   History of Recent Intubation: Yes  Behavior/Cognition: Cooperative; Alert; Distractible Self-Feeding Abilities: Dependent for feeding; Needs hand-over-hand assist for feeding Baseline vocal  quality/speech: Normal Volitional Cough: Able to elicit (weak) Volitional Swallow: Unable to elicit No data recorded Goal Planning: Prognosis for improved oropharyngeal function: Guarded Barriers to Reach Goals: Cognitive deficits; Time post onset; Severity of deficits; Overall medical prognosis No data recorded Patient/Family Stated Goal: none stated Consulted and agree with results and recommendations: Nurse; Physician Pain: Pain Assessment Pain Assessment: No/denies pain Breathing: 0 Negative Vocalization: 0 Facial Expression: 0 Body Language: 0 Consolability: 0 PAINAD Score: 0 Facial Expression: 0 Body Movements: 0 Muscle Tension: 0 Compliance with ventilator (intubated pts.): 0 Vocalization (extubated pts.): N/A CPOT Total: 0 End of Session: Start Time:SLP Start Time (ACUTE ONLY): 0810 Stop Time: SLP Stop Time (ACUTE ONLY): 0900 Time Calculation:SLP Time Calculation (min) (ACUTE ONLY): 50 min Charges: SLP Evaluations $ SLP Speech Visit: 1 Visit SLP Evaluations $BSS Swallow: 1 Procedure $MBS Swallow: 1 Procedure SLP visit diagnosis: SLP Visit Diagnosis: Dysphagia, oropharyngeal phase (R13.12) Past Medical History: Past Medical History: Diagnosis Date  Depression   Gout   Per Patient's nephew  Hyperlipidemia   Patient's nephew  Hypertension  Past Surgical History: No past surgical history on file. [place] J Clapp 08/11/2022, 9:34 AM   Disposition Plan & Communication  Patient status: Inpatient  Admitted From: Home Planned disposition location: Skilled nursing facility Anticipated discharge date: 6/24 pending clinical improvement, dispo decisions  Family Communication: family members meeting with palliative   Author: Leeroy Bock, DO [facility] 08/13/2022, 7:44 AM   Available by Epic secure chat 7AM-7PM. If 7PM-7AM, please contact night-coverage.  [facility] contact information found on [email or web address].

## 2022-08-13 NOTE — Progress Notes (Addendum)
[facility]) Hospice hospital liaison note  Referral received for hospice services upon return to LTC facility. Met in patient's room with several family members including patient's HCPOA Brett. Hospice services and philosophy were discussed and all questions were answered.   At this time family would like to proceed with keeping patient comfortable and proceed with hospice services however they have concerns about him returning to his previous facility. This information was shared with TOC and PMT.   Liaison will continue to follow for discharge disposition.   Thank you for the opportunity to participate in this patient's plan of care.  Thea Gist, BSN, RN Hospice hospital liaison [phone]

## 2022-08-13 NOTE — Progress Notes (Signed)
Daily Progress Note   Patient Name: Marc Smith       Date: 08/13/2022 DOB: September 14, 1945  Age: 77 y.o. MRN#: [id number] Attending Physician: Leeroy Bock, MD Primary Care Physician: Almetta Lovely, [facility] Admit Date: 08/09/2022  Reason for Consultation/Follow-up: Establishing goals of care  HPI/Brief Hospital Review:  77 y.o. 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 Smith [name] with oncology), HTN, HLD, dementia and gout admitted from [facility] on 08/09/2022 with altered mental status. Reports from facility, found to be febrile and minimally responsive prompting EMS to be called.   Found to have sepsis secondary to aspiration PNA, UTI, required intubation in ED, successfully extubated 6/19   Concern for recurrent aspiration, failed ST evaluation 6/20 remains NPO  CT head 6/18 IMPRESSION: 1. No evidence of acute intracranial abnormality. 2. Severe chronic microvascular ischemic disease.   Palliative medicine was consulted for assisting with goals of care conversations.  Subjective: Extensive chart review has been completed prior to meeting patient including labs, vital signs, imaging, progress notes, orders, and available advanced directive documents from current and previous encounters.    Called and spoke with speech therapy. Per notes from 6/20 from modified barium swallow study"  Given risk for aspiration based on stated oropharyngeal impairments, observed aspiration across liquid consistencies, and overall health/deconditioning/hx of recurrent PNA, no "safe" diet can be recommended at this time. Recommend NPO with medications administered alternatively. SLP will follow up with family for  education for results of assessment and recommendations. Recommend consult with palliative for discussion of GOC. MD and RN aware of recommendations.   Visited with Mr. Cortese at his bedside. Resting but opens his eyes and acknowledges my presence in room with calling of his name. Mr. Kalas able to answer simple orientation questions appropriately, remains unclear on time and situation. We discussed events leading to hospitalization and current conditions and treatments. I reviewed results from swallow study as above-shared food/liquid cannot be safely offered to him at this time. We briefly discussed options such as PEG tube placement versus comfort feeds/comfort care. Mr. Petrich was clear in stating he does not wish to have PEG tube placed.  Later met back at bedside with multiple nieces and nephews present, including Brett-HCPOA. With all family I again reviewed  findings from swallow study. We discussed impairments in swallowing being irreversible and likely progressive overtime. Again discussed pathways of pursing possible PEG tube placement pending surgical clearance versus comfort foods/comfort care.  We discussed understanding of risks associated of allowing comfort feedings-risk of aspiration leading to pneumonia also with the understanding in that instance avoiding aggressive medical treatments/interventions and focusing of symptom management/comfort.  We also discussed risks associated with PEG tube placement. Possible GI side effects also with the risk of recurrent aspiration of tube feedings. Concern also regarding Mr. Bialy attempting to pull out or remove PEG tube due to noted confusion/agitation through the night.  We discussed comfort measures. Mr. Rosten would no longer receive aggressive medical interventions such as continuous vital signs, lab work, radiology testing, or medications not focused on comfort. All care would focus on how the patient is looking and feeling. This would  include management of any symptoms that may cause discomfort, pain, shortness of breath, cough, nausea, agitation, anxiety, and/or secretions etc. Symptoms would be managed with medications and other non-pharmacological interventions such as spiritual support if requested, repositioning, music therapy, or therapeutic listening. Family verbalized understanding and appreciation. We discussed comfort feeds-allowing small supervised bites of soft foods including ice cream, applesauce, mashed potatoes etc.  Also discussed overall philosophy of hospice. Possibility of hospice services being able to follow on his return to LTC. [name] along with other family expressed interest in learning more about services offered from hospice in LTC setting. At this time, family requests time for processing and time to discuss options amongst themselves. TOC made aware to coordinate family communication with hospice liaison. Report provided to hospice liaison once choice offered/made.  Encouraged family to continue to communicate keeping Mr. Procter at the center of their decision making with the understanding his nutritional status is of most importance.  PMT to continue to follow for ongoing needs and support.   Palliative Care Assessment & Plan   Assessment/Recommendation/Plan  DNR Time for processing-family meeting with hospice liaison-comfort care versus PEG placement PMT to continue to follow for ongoing needs and support  Care plan was discussed with nursing staff, primary team and TOC.  Thank you for allowing the Palliative Medicine Team to assist in the care of this patient.  Total time:  50 minutes  Time spent includes: Detailed review of medical records (labs, imaging, vital signs), medically appropriate exam (mental status, respiratory, cardiac, skin), discussed with treatment team, counseling and educating patient, family and staff, documenting clinical information, medication management and  coordination of care.  Leeanne Deed, DNP, AGNP-C Palliative Medicine   Please contact Palliative Medicine Team phone at [phone] for questions and concerns.

## 2022-08-14 DIAGNOSIS — A419 Sepsis, unspecified organism: Secondary | ICD-10-CM | POA: Diagnosis not present

## 2022-08-14 DIAGNOSIS — J690 Pneumonitis due to inhalation of food and vomit: Secondary | ICD-10-CM | POA: Diagnosis not present

## 2022-08-14 DIAGNOSIS — R6521 Severe sepsis with septic shock: Secondary | ICD-10-CM | POA: Diagnosis not present

## 2022-08-14 DIAGNOSIS — I214 Non-ST elevation (NSTEMI) myocardial infarction: Secondary | ICD-10-CM | POA: Diagnosis not present

## 2022-08-14 LAB — GLUCOSE, CAPILLARY
Glucose-Capillary: 98 mg/dL (ref 70–99)
Glucose-Capillary: 99 mg/dL (ref 70–99)
Glucose-Capillary: 64 mg/dL — ABNORMAL LOW (ref 70–99)
Glucose-Capillary: 159 mg/dL — ABNORMAL HIGH (ref 70–99)
Glucose-Capillary: 90 mg/dL (ref 70–99)

## 2022-08-14 LAB — CULTURE, BLOOD (ROUTINE X 2)
Culture: NO GROWTH
Culture: NO GROWTH
Special Requests: ADEQUATE
Special Requests: ADEQUATE
Special Requests: ADEQUATE

## 2022-08-14 MED ORDER — HALOPERIDOL 0.5 MG PO TABS
0.5000 mg | ORAL_TABLET | ORAL | Status: DC | PRN
  Administered 2022-08-19 – 2022-08-21 (×2): 0.5 mg via ORAL
  Filled 2022-08-14 (×2): qty 1

## 2022-08-14 MED ORDER — GLYCOPYRROLATE 0.2 MG/ML IJ SOLN
0.2000 mg | INTRAMUSCULAR | Status: DC | PRN
  Administered 2022-08-16: 0.2 mg via INTRAVENOUS
  Filled 2022-08-14: qty 1

## 2022-08-14 MED ORDER — LORAZEPAM 1 MG PO TABS
1.0000 mg | ORAL_TABLET | ORAL | Status: DC | PRN
  Administered 2022-08-19 – 2022-08-20 (×2): 1 mg via ORAL
  Filled 2022-08-14 (×2): qty 1

## 2022-08-14 MED ORDER — HALOPERIDOL LACTATE 2 MG/ML PO CONC: 0.5000 mg | ORAL | Status: DC | PRN

## 2022-08-14 MED ORDER — LORAZEPAM 2 MG/ML PO CONC
1.0000 mg | ORAL | Status: DC | PRN
  Administered 2022-08-23: 1 mg via SUBLINGUAL
  Filled 2022-08-14: qty 1

## 2022-08-14 MED ORDER — HALOPERIDOL LACTATE 5 MG/ML IJ SOLN
0.5000 mg | INTRAMUSCULAR | Status: DC | PRN
  Administered 2022-08-15 – 2022-08-23 (×4): 0.5 mg via INTRAVENOUS
  Filled 2022-08-14 (×4): qty 1

## 2022-08-14 MED ORDER — ONDANSETRON 4 MG PO TBDP: 4.0000 mg | ORAL_TABLET | Freq: Four times a day (QID) | ORAL | Status: DC | PRN

## 2022-08-14 MED ORDER — LORAZEPAM 2 MG/ML IJ SOLN
1.0000 mg | INTRAMUSCULAR | Status: DC | PRN
  Filled 2022-08-14: qty 1

## 2022-08-14 MED ORDER — POLYVINYL ALCOHOL 1.4 % OP SOLN: 1.0000 [drp] | Freq: Four times a day (QID) | OPHTHALMIC | Status: DC | PRN

## 2022-08-14 MED ORDER — GLYCOPYRROLATE 0.2 MG/ML IJ SOLN
0.2000 mg | INTRAMUSCULAR | Status: DC | PRN
  Filled 2022-08-14: qty 1

## 2022-08-14 MED ORDER — MORPHINE SULFATE (CONCENTRATE) 10 MG/0.5ML PO SOLN
5.0000 mg | ORAL | Status: DC | PRN
  Filled 2022-08-14: qty 0.5

## 2022-08-14 MED ORDER — MORPHINE SULFATE (CONCENTRATE) 10 MG/0.5ML PO SOLN
5.0000 mg | ORAL | Status: DC | PRN
  Administered 2022-08-22 – 2022-08-23 (×2): 5 mg via ORAL
  Filled 2022-08-14: qty 0.5

## 2022-08-14 MED ORDER — ACETAMINOPHEN 650 MG RE SUPP: 650.0000 mg | Freq: Four times a day (QID) | RECTAL | Status: DC | PRN

## 2022-08-14 MED ORDER — DEXTROSE 50 % IV SOLN: 1.0000 | Freq: Once | INTRAVENOUS | Status: AC

## 2022-08-14 MED ORDER — ONDANSETRON HCL 4 MG/2ML IJ SOLN: 4.0000 mg | Freq: Four times a day (QID) | INTRAMUSCULAR | Status: DC | PRN

## 2022-08-14 MED ORDER — GLYCOPYRROLATE 1 MG PO TABS: 1.0000 mg | ORAL_TABLET | ORAL | Status: DC | PRN

## 2022-08-14 MED ORDER — BIOTENE DRY MOUTH MT LIQD: 15.0000 mL | OROMUCOSAL | Status: DC | PRN

## 2022-08-14 MED ORDER — ACETAMINOPHEN 325 MG PO TABS: 650.0000 mg | ORAL_TABLET | Freq: Four times a day (QID) | ORAL | Status: DC | PRN

## 2022-08-14 MED ORDER — DEXTROSE 50 % IV SOLN
INTRAVENOUS | Status: AC
  Administered 2022-08-14: 50 mL via INTRAVENOUS
  Filled 2022-08-14: qty 50

## 2022-08-14 NOTE — Progress Notes (Addendum)
Daily Progress Note   Patient Name: Marc Smith       Date: 08/14/2022 DOB: 25-Sep-1945  Age: 77 y.o. MRN#: [id number] Attending Physician: Leeroy Bock, MD Primary Care Physician: Almetta Lovely, [facility] Admit Date: 08/09/2022  Reason for Consultation/Follow-up: Establishing goals of care  HPI/Brief Hospital Review: 77 y.o. 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 Smith [name] with oncology), HTN, HLD, dementia and gout admitted from [facility] on 08/09/2022 with altered mental status. Reports from facility, found to be febrile and minimally responsive prompting EMS to be called.   Found to have sepsis secondary to aspiration PNA, UTI, required intubation in ED, successfully extubated 6/19   Concern for recurrent aspiration, failed ST evaluation 6/20 remains NPO   CT head 6/18 IMPRESSION: 1. No evidence of acute intracranial abnormality. 2. Severe chronic microvascular ischemic disease.   Palliative medicine was consulted for assisting with goals of care conversations.  Subjective: Extensive chart review has been completed prior to meeting patient including labs, vital signs, imaging, progress notes, orders, and available advanced directive documents from current and previous encounters.    Visited with Mr. [name] at his bedside. Minimally interactive, will intermittently open eyes with calling of his name and able simple questions with shaking of head or one word answers.  Met with various family members throughout the day. Discussions were had surrounding goals of care-PEG placement versus comfort care. In depth discussions around comfort care provided to various family members.  Met with [name]  and Inetta Fermo at bedside both HCPOA. Decision made to transition to comfort care allowing for comfort feeds with the understanding of the risk of aspiration and infection/pneumonia with avoidance of aggressive medical interventions. At this time during his remaining hospitalization they wish to continue IV fluids and IV antibiotics with the understanding these cannot be continued at discharge.  MOST form completed reflecting wishes -DNR -Comfort measures -Determine use or limitation of antibiotics when infection occurs (continue IV antibiotics during this hospitalization) -IV fluids for defined trial period (continue IV fluids during this hospitalization) -No feeding tube  Copied and scanned into EMR  [name] and Inetta Fermo continue to express interest in hospice services following at discharge. They express their interest in new LTC facility as they do not feel [address]  of [place] can provide necessary care. Shared TOC will assist with this process.  [name] and Inetta Fermo also agree to starting comfort medications as needed such as morphine, lorazepam and haldol.  PMT to continue to follow for ongoing needs and support.  Assessment/Recommendation/Plan  DNR Comfort measures with comfort feeds, continuing IV fluids and IV antibiotics during hospitalization May have comfort feeds from floor stock: ice cream, milkshakes, mashed potatoes-would continue to recommend thickened liquids Initiating comfort medications to be used as needed TOC aware of disposition needs Hospice liaison aware of case  Care plan was discussed with primary team, nursing staff, TOC and hospice liaison.  Thank you for allowing the Palliative Medicine Team to assist in the care of this patient.  Total time:  65 minutes  Time spent includes: Detailed review of medical records (labs, imaging, vital signs), medically appropriate exam (mental status, respiratory, cardiac, skin), discussed with treatment team, counseling and educating patient,  family and staff, documenting clinical information, medication management and coordination of care.  Leeanne Deed, DNP, AGNP-C Palliative Medicine   Please contact Palliative Medicine Team phone at [phone] for questions and concerns.

## 2022-08-14 NOTE — Progress Notes (Signed)
[facility]) Hospice hospital liaison note   Patient for hospice services in LTC at discharge.  Liaison continues to follow for discharge disposition.    Doreatha Martin, RN, BSN [facility] Liaison [phone]

## 2022-08-14 NOTE — Progress Notes (Signed)
PROGRESS NOTE  Marc Smith    DOB: Dec 03, 1945, 77 y.o.  QMV:[id number]    Code Status: DNR   DOA: 08/09/2022   LOS: 5   Brief hospital course  Marc Smith is a 77 y.o. 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  They presented from nursing home to the ED on 08/09/2022 with AMS x 1 days.  In the ED, it was found that they had sepsis with hypoxia and respiratory distress so intubated.  Significant findings included  lactic acid 1.1 BUN/creatinine 41/1.60216 WBC 11.8, platelets 507. CT chest with airspace disease in the left lower lobe, patchy airspace opacities in the right lower lobe, thyroid nodule.  They were initially treated with hydrocortisone, and antibiotics..   Patient was admitted to medicine service for further workup and management of respiratory failure as outlined in detail below.  6/20- weaned to room air. Barium swallow study completed. SLP evaluated and due to high risk of aspiration, recommended NPO. Palliative consulted to discuss GOC.  6/21:stable. Remains NPO. Respiratory status has improved. Palliative has initiated GOC discussions with family.Overnight, he had increased delirium requiring tele-sitter and mittens as well as haldol. He did not sustain injuries.  6/22: stable. No overnight events. Family meeting with palliative today. 6/23: further palliative and hospice discussions with family. Patient states he is hungry. Another episode of hypoglycemia so increased fluid rate. Completed course of azithromycin  Assessment & Plan  Principal Problem:   Aspiration pneumonia (HCC) Active Problems:   Sepsis with acute hypoxic respiratory failure and septic shock (HCC)   NSTEMI (non-ST elevated myocardial infarction) (HCC)  Acute hypoxic respiratory failure secondary to aspiration pneumonia Sepsis present on admission Stable postextubation.  Now on  room air - cultures returned resistant to unasyn. Change to CTX 6/20 - continue azithromycin - SLP evaluation- recommend NPO status - palliative consulted to discuss GOC with family if patient does not improve to tolerating a diet - IVF - supportive care for secretions - wean from stress-dose steroids  Hypoglycemia- in setting of NPO - fluids with d5 continuous - glucose ampule PRN - regular CBG monitoring  UTI- good UOP. Foley in place for comfort care - coverage as above.   Dementia- stable. Oriented to self. Intermittently sedated  Thrombocytosis, JAK2 positive- platelets 550 Hold hydroxyurea for now  Body mass index is 19.73 kg/m.  VTE ppx: SCDs Start: 08/09/22 1004  Diet:     Diet   Diet NPO time specified   Consultants: [facility] Palliative   Subjective 08/14/22    Pt reports feeling hungry. States "that's all for now" when asked if he has any other needs. Asks for applesauce and pudding. Denies pain.    Objective   Vitals:   08/13/22 1604 08/13/22 2337 08/14/22 0425 08/14/22 0459  BP: (!) 142/86 (!) 155/87  (!) 143/92  Pulse: 74 66  72  Resp: 15 16  16   Temp: 98.4 F (36.9 C) 97.6 F (36.4 C)  (!) 97.4 F (36.3 C)  TempSrc:    Oral  SpO2: 99% 99%  100%  Weight:   66 kg   Height:        Intake/Output Summary (Last 24 hours) at 08/14/2022 0733 Last data filed at 08/14/2022 0424 Gross per 24 hour  Intake 1008.56 ml  Output 1150 ml  Net -141.44 ml    Filed Weights   08/12/22 0500 08/13/22 0500 08/14/22 0425  Weight: 66.6 kg 74.8  kg 66 kg    Physical Exam:  General: awake, alert, NAD. Falls asleep if not actively engaged in conversation HEENT: atraumatic, clear conjunctiva, anicteric sclera, MMM, hard of hearing Respiratory: normal respiratory effort. Stertor and rhonchi present Cardiovascular: quick capillary refill, normal S1/S2, RRR, no JVD, murmurs Nervous: A&O x1. no gross focal neurologic deficits, normal speech Extremities: moves all  equally, no edema, low muscle tone Skin: dry, intact, normal temperature, normal color. No rashes, lesions or ulcers on exposed skin Psychiatry: normal mood, congruent affect  Labs   I have personally reviewed the following labs and imaging studies CBC    Component Value Date/Time   WBC 12.8 (H) 08/13/2022 0918   RBC 4.87 08/13/2022 0918   HGB 13.1 08/13/2022 0918   HGB 11.5 (L) 02/10/2012 0444   HCT 40.0 08/13/2022 0918   HCT 31.6 (L) 02/10/2012 0444   PLT 702 (H) 08/13/2022 0918   PLT 410 02/10/2012 0444   MCV 82.1 08/13/2022 0918   MCV 86 02/10/2012 0444   MCH 26.9 08/13/2022 0918   MCHC 32.8 08/13/2022 0918   RDW 18.5 (H) 08/13/2022 0918   RDW 13.7 02/10/2012 0444   LYMPHSABS 2.0 08/09/2022 0659   LYMPHSABS 0.9 (L) 02/10/2012 0444   MONOABS 0.4 08/09/2022 0659   MONOABS 0.7 02/10/2012 0444   EOSABS 0.0 08/09/2022 0659   EOSABS 0.0 02/10/2012 0444   BASOSABS 0.0 08/09/2022 0659   BASOSABS 0.0 02/10/2012 0444      Latest Ref Rng & Units 08/13/2022    9:18 AM 08/12/2022    4:03 AM 08/11/2022    5:30 AM  BMP  Glucose 70 - 99 mg/dL 956  98  213   BUN 8 - 23 mg/dL 19  20  29    Creatinine 0.61 - 1.24 mg/dL 0.86  5.78  4.69   Sodium 135 - 145 mmol/L 137  141  146   Potassium 3.5 - 5.1 mmol/L 2.9  3.0  3.1   Chloride 98 - 111 mmol/L 99  105  110   CO2 22 - 32 mmol/L 27  27  27    Calcium 8.9 - 10.3 mg/dL 8.6  8.2  8.3    Disposition Plan & Communication  Patient status: Inpatient  Admitted From: Home Planned disposition location: Skilled nursing facility Anticipated discharge date: 6/24 pending clinical improvement, dispo decisions  Family Communication: family members meeting with palliative   Author: Leeroy Bock, DO [facility] 08/14/2022, 7:33 AM   Available by Epic secure chat 7AM-7PM. If 7PM-7AM, please contact night-coverage.  [facility] contact information found on [email or web address].

## 2022-08-14 NOTE — Progress Notes (Signed)
MD cancelled PT orders as of 08/13/2022. Confirmed PT cancel with Dr. Jamelle Rushing via Secure chat on 08/14/2022.

## 2022-08-14 NOTE — Plan of Care (Signed)

## 2022-08-15 DIAGNOSIS — R6521 Severe sepsis with septic shock: Secondary | ICD-10-CM | POA: Diagnosis not present

## 2022-08-15 DIAGNOSIS — J690 Pneumonitis due to inhalation of food and vomit: Secondary | ICD-10-CM | POA: Diagnosis not present

## 2022-08-15 DIAGNOSIS — I214 Non-ST elevation (NSTEMI) myocardial infarction: Secondary | ICD-10-CM | POA: Diagnosis not present

## 2022-08-15 DIAGNOSIS — A419 Sepsis, unspecified organism: Secondary | ICD-10-CM | POA: Diagnosis not present

## 2022-08-15 LAB — GLUCOSE, CAPILLARY: Glucose-Capillary: 103 mg/dL — ABNORMAL HIGH (ref 70–99)

## 2022-08-15 MED ORDER — KCL IN DEXTROSE-NACL 40-5-0.9 MEQ/L-%-% IV SOLN
INTRAVENOUS | Status: DC
  Filled 2022-08-15 (×3): qty 1000

## 2022-08-15 NOTE — Care Management Important Message (Signed)
Important Message  Patient Details  Name: Marc Smith MRN: [id number] Date of Birth: 1945-04-13   Medicare Important Message Given:  Other (see comment)  Disposition to discharge with hospice services.  Medicare IM withheld at this time out of respect for patient and family.   Johnell Comings 08/15/2022, 10:32 AM

## 2022-08-15 NOTE — Progress Notes (Signed)
Nutrition Brief Note  Chart reviewed. Pt now transitioning to comfort care.  No further nutrition interventions planned at this time.  Please re-consult as needed.   Kathrynn Backstrom W, RD, LDN, CDCES Registered Dietitian II Certified Diabetes Care and Education Specialist Please refer to AMION for RD and/or RD on-call/weekend/after hours pager   

## 2022-08-15 NOTE — Progress Notes (Signed)
[facility]) Hospice hospital liaison note   Patient for hospice services in LTC at discharge.  Liaison continues to follow for discharge disposition.   [facility] Liaison [phone]

## 2022-08-15 NOTE — TOC Progression Note (Addendum)
Transition of Care [facility]) - Progression Note    Patient Details  Name: Marc Smith MRN: [id number] Date of Birth: [birth year]  [facility] CM/SW Contact  Chapman Fitch, RN Phone Number: 08/15/2022, 10:17 AM  Clinical Narrative:     Per notes and MD plan is for LTC with hospice Per  Tanya at [facility] patient must be mitt free for 72 hours, and sitter free for 24 hours.   Patient still with tele sitter at this time.  Kenney Houseman states that nursing will have to review prior to confirming bed offer due to patient requiring LTC  Bed search updated to include that patient has existing Medicaid and will require LTC with hospice  Expected Discharge Plan:  (TBD) Barriers to Discharge: Continued Medical Work up  Expected Discharge Plan and Services     Post Acute Care Choice:  (TBD) Living arrangements for the past 2 months: Assisted Living Facility                                       Social Determinants of Health (SDOH) Interventions SDOH Screenings   Food Insecurity: No Food Insecurity (07/22/2022)  Housing: Patient Unable To Answer (07/22/2022)  Transportation Needs: No Transportation Needs (07/22/2022)  Utilities: Not At Risk (07/22/2022)  Depression (PHQ2-9): Low Risk  (07/22/2022)  Tobacco Use: High Risk (08/09/2022)    Readmission Risk Interventions     No data to display

## 2022-08-15 NOTE — Progress Notes (Signed)
PROGRESS NOTE  [name]    DOB: 05-24-1945, 77 y.o.  XBM:[id number]    Code Status: DNR   DOA: 08/09/2022   LOS: 6   Brief hospital course  Marc Smith is a 77 y.o. 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  They presented from nursing home to the ED on 08/09/2022 with AMS x 1 days.  In the ED, it was found that they had sepsis with hypoxia and respiratory distress so intubated.  Significant findings included  lactic acid 1.1 BUN/creatinine 41/1.60216 WBC 11.8, platelets 507. CT chest with airspace disease in the left lower lobe, patchy airspace opacities in the right lower lobe, thyroid nodule.  They were initially treated with hydrocortisone, and antibiotics..   Patient was admitted to medicine service for further workup and management of respiratory failure as outlined in detail below.  6/20- weaned to room air. Barium swallow study completed. SLP evaluated and due to high risk of aspiration, recommended NPO. Palliative consulted to discuss GOC.  6/21:stable. Remains NPO. Respiratory status has improved. Palliative has initiated GOC discussions with family.Overnight, he had increased delirium requiring tele-sitter and mittens as well as haldol. He did not sustain injuries.  6/22: stable. No overnight events. Family meeting with palliative today. 6/23: further palliative and hospice discussions with family. Patient states he is hungry. Another episode of hypoglycemia so increased fluid rate. Completed course of azithromycin 6/24: clinically stable for discharge to LTC hospice when available.   Assessment & Plan  Principal Problem:   Aspiration pneumonia (HCC) Active Problems:   Sepsis with acute hypoxic respiratory failure and septic shock (HCC)   NSTEMI (non-ST elevated myocardial infarction) (HCC)  Acute hypoxic respiratory failure secondary to aspiration  pneumonia Sepsis present on admission Stable postextubation.  Now on room air - cultures returned resistant to unasyn. Change to CTX 6/20. Complete 6/25 - completed azithromycin - SLP evaluation- recommend NPO status  - now on comfort feeds - palliative consulted to discuss GOC with family  - pursuing hospice and comfort care - IVF - supportive care for secretions - wean from stress-dose steroids  Hypoglycemia- in setting of NPO - fluids with d5 continuous per family request - glucose ampule PRN - regular CBG monitoring  UTI- good UOP. Foley in place for comfort care - coverage as above.   Dementia- stable. Oriented to self. Intermittently sedated  Thrombocytosis, JAK2 positive- platelets 550 Hold hydroxyurea for now  Body mass index is 20.66 kg/m.  VTE ppx: SCDs Start: 08/09/22 1004  Diet:     Diet   Diet NPO time specified Except for: Other (See Comments), Ice Chips   Consultants: [facility] Palliative   Subjective 08/15/22    Pt reports feeling hungry. No other complaints.    Objective   Vitals:   08/14/22 0826 08/14/22 1634 08/14/22 2029 08/15/22 0452  BP: 136/84 114/86 130/81   Pulse: 69 84 74   Resp: 18 20 20    Temp: 98.5 F (36.9 C) 98.5 F (36.9 C) 97.8 F (36.6 C)   TempSrc: Axillary Axillary Axillary   SpO2: 99% 98% 98%   Weight:    69.1 kg  Height:        Intake/Output Summary (Last 24 hours) at 08/15/2022 0754 Last data filed at 08/15/2022 0639 Gross per 24 hour  Intake 1943.8 ml  Output 1100 ml  Net 843.8 ml    Filed Weights   08/13/22 0500 08/14/22 0425 08/15/22  0452  Weight: 74.8 kg 66 kg 69.1 kg    Physical Exam:  General: awake, alert, NAD. Falls asleep if not actively engaged in conversation HEENT: atraumatic, clear conjunctiva, anicteric sclera, MMM, hard of hearing Respiratory: normal respiratory effort. Stertor and rhonchi present Cardiovascular: quick capillary refill, normal S1/S2, RRR, no JVD, murmurs Nervous: A&O x1. no  gross focal neurologic deficits, normal speech Extremities: moves all equally, no edema, low muscle tone Skin: dry, intact, normal temperature, normal color. No rashes, lesions or ulcers on exposed skin Psychiatry: normal mood, congruent affect  Labs   I have personally reviewed the following labs and imaging studies CBC    Component Value Date/Time   WBC 12.8 (H) 08/13/2022 0918   RBC 4.87 08/13/2022 0918   HGB 13.1 08/13/2022 0918   HGB 11.5 (L) 02/10/2012 0444   HCT 40.0 08/13/2022 0918   HCT 31.6 (L) 02/10/2012 0444   PLT 702 (H) 08/13/2022 0918   PLT 410 02/10/2012 0444   MCV 82.1 08/13/2022 0918   MCV 86 02/10/2012 0444   MCH 26.9 08/13/2022 0918   MCHC 32.8 08/13/2022 0918   RDW 18.5 (H) 08/13/2022 0918   RDW 13.7 02/10/2012 0444   LYMPHSABS 2.0 08/09/2022 0659   LYMPHSABS 0.9 (L) 02/10/2012 0444   MONOABS 0.4 08/09/2022 0659   MONOABS 0.7 02/10/2012 0444   EOSABS 0.0 08/09/2022 0659   EOSABS 0.0 02/10/2012 0444   BASOSABS 0.0 08/09/2022 0659   BASOSABS 0.0 02/10/2012 0444      Latest Ref Rng & Units 08/13/2022    9:18 AM 08/12/2022    4:03 AM 08/11/2022    5:30 AM  BMP  Glucose 70 - 99 mg/dL 696  98  295   BUN 8 - 23 mg/dL 19  20  29    Creatinine 0.61 - 1.24 mg/dL 2.84  1.32  4.40   Sodium 135 - 145 mmol/L 137  141  146   Potassium 3.5 - 5.1 mmol/L 2.9  3.0  3.1   Chloride 98 - 111 mmol/L 99  105  110   CO2 22 - 32 mmol/L 27  27  27    Calcium 8.9 - 10.3 mg/dL 8.6  8.2  8.3    Disposition Plan & Communication  Patient status: Inpatient  Admitted From: Home Planned disposition location: Skilled nursing facility Anticipated discharge date: 6/25 pending clinical improvement, dispo decisions  Family Communication: family members meeting with palliative   Author: Leeroy Bock, DO [facility] 08/15/2022, 7:54 AM   Available by Epic secure chat 7AM-7PM. If 7PM-7AM, please contact night-coverage.  TRH contact information found on [email or web address].

## 2022-08-15 NOTE — TOC Progression Note (Signed)
Transition of Care [facility]) - Progression Note    Patient Details  Name: Marc Smith MRN: [id number] Date of Birth: 08/09/1945  [facility]) CM/SW Contact  Chapman Fitch, RN Phone Number: 08/15/2022, 3:28 PM  Clinical Narrative:      Still awaiting determination from [facility] if they can offer a LTC bed with hospice   Expected Discharge Plan:  (TBD) Barriers to Discharge: Continued Medical Work up  Expected Discharge Plan and Services     Post Acute Care Choice:  (TBD) Living arrangements for the past 2 months: Assisted Living Facility                                       Social Determinants of Health (SDOH) Interventions SDOH Screenings   Food Insecurity: No Food Insecurity (07/22/2022)  Housing: Patient Unable To Answer (07/22/2022)  Transportation Needs: No Transportation Needs (07/22/2022)  Utilities: Not At Risk (07/22/2022)  Depression (PHQ2-9): Low Risk  (07/22/2022)  Tobacco Use: High Risk (08/09/2022)    Readmission Risk Interventions     No data to display

## 2022-08-16 DIAGNOSIS — Z515 Encounter for palliative care: Secondary | ICD-10-CM | POA: Diagnosis not present

## 2022-08-16 DIAGNOSIS — R6521 Severe sepsis with septic shock: Secondary | ICD-10-CM | POA: Diagnosis not present

## 2022-08-16 DIAGNOSIS — J690 Pneumonitis due to inhalation of food and vomit: Secondary | ICD-10-CM | POA: Diagnosis not present

## 2022-08-16 DIAGNOSIS — I214 Non-ST elevation (NSTEMI) myocardial infarction: Secondary | ICD-10-CM | POA: Diagnosis not present

## 2022-08-16 DIAGNOSIS — A419 Sepsis, unspecified organism: Secondary | ICD-10-CM | POA: Diagnosis not present

## 2022-08-16 NOTE — Progress Notes (Signed)
                                                     Palliative Care Progress Note, Assessment & Plan   Patient Name: Marc Marc       Date: 08/16/2022 DOB: 05-Jul-1945  Age: 77 y.o. MRN#: [id number] Attending Physician: Marc Bock, MD Primary Care Physician: [facility] Admit Date: 08/09/2022  Subjective: Patient is sitting up in bed in no apparent distress.  He is asleep but easily awakened.  He acknowledges my presence and is able to make his wishes known.  No family or friends present during my bedside visit.  HPI: 77 y.o. 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 Smith Marc with oncology), HTN, HLD, dementia and gout admitted from [facility] [place] on 08/09/2022 with altered mental status. Reports from facility, found to be febrile and minimally responsive prompting EMS to be called.   Found to have sepsis secondary to aspiration PNA, UTI, required intubation in ED, successfully extubated 6/19   Concern for recurrent aspiration, failed ST evaluation 6/20 remains NPO   CT head 6/18 IMPRESSION: 1. No evidence of acute intracranial abnormality. 2. Severe chronic microvascular ischemic disease.   Palliative medicine was consulted for assisting with goals of care conversations.  Summary of counseling/coordination of care: After reviewing the patient's chart and assessing the patient at bedside, I spoke with patient in regards to symptom management.  Symptoms assessed.  Patient denies pain or discomfort at this time.  Belly is soft.  He denies nausea/vomiting/diarrhea/constipation.  He shares he is in no pain but he remains hungry.  We discussed that he has able to except comfort feeds/small bites of food when awake and safely able to ingest these.  We  discussed "wetting his whistle" and allowing food for fun and not fuel.  Patient shares he would like "anything".  After assessing the patient, I spoke with patient's dayshift RN.  She shares she has continued to encourage p.o. intake when patient is alert and asking for it.  No adjustment to medications needed at this time.  Comfort measures continue.  PMT will continue to follow and support patient throughout hospitalization.  Physical Exam Vitals reviewed.  Constitutional:      General: He is not in acute distress.    Appearance: He is normal weight.  HENT:     Head: Normocephalic.     Mouth/Throat:     Mouth: Mucous membranes are moist.  Eyes:     Pupils: Pupils are equal, round, and reactive to light.  Abdominal:     Palpations: Abdomen is soft.  Musculoskeletal:     Comments: Generalized weakness  Skin:    General: Skin is warm and dry.  Neurological:     Mental Status: He is alert.  Psychiatric:        Mood and Affect: Mood normal.        Behavior: Behavior normal.             Total Time 25 minutes   Marc Marc L. Marc Hilding, FNP-BC Palliative Medicine Team Team Phone # [phone]

## 2022-08-16 NOTE — Progress Notes (Signed)
Patient transferred to room 1C by hospital transport staff. Family member, catina notified of transfer and new room number, and 1C given report via phone all questions and concerns addressed at this time. No belongings in room with patient

## 2022-08-16 NOTE — Progress Notes (Signed)
PROGRESS NOTE  Marc Smith    DOB: 1945/06/04, 77 y.o.  XBJ:[id number]    Code Status: DNR   DOA: 08/09/2022   LOS: 7   Brief hospital course  Marc Smith is a 77 y.o. 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  They presented from nursing home to the ED on 08/09/2022 with AMS x 1 days.  In the ED, it was found that they had sepsis with hypoxia and respiratory distress so intubated and admitted to ICU.  Significant findings included  lactic acid 1.1, BUN/creatinine 41/1.60216, WBC 11.8, platelets 507. Urinalysis positive for infection. CT chest with airspace disease in the left lower lobe, patchy airspace opacities in the right lower lobe, thyroid nodule. CT head non acute.  They were initially treated with intubation, hydrocortisone, and antibiotics for PNA and UTI.  6/19: extubated. Continued on antibiotics for PNA and UTI 6/20- weaned to room air. Barium swallow study completed. SLP evaluated and due to high risk of aspiration, recommended keep NPO. Palliative consulted to discuss GOC.  6/21:stable. Remains NPO. Overnight, he had increased delirium requiring tele-sitter and mittens as well as haldol. He did not sustain injuries.  6/22: stable. No overnight events. Family meeting with palliative today. 6/23: further palliative and hospice discussions with family. Patient states he is hungry. Another episode of hypoglycemia so increased fluid rate. Completed course of azithromycin. Transitioned to comfort care. Able to tolerate comfort feeds.  6/24: clinically stable on room air for discharge to LTC hospice when available.   Assessment & Plan  Principal Problem:   Aspiration pneumonia (HCC) Active Problems:   Sepsis with acute hypoxic respiratory failure and septic shock (HCC)   NSTEMI (non-ST elevated myocardial infarction) (HCC)  Acute hypoxic respiratory failure  secondary to aspiration pneumonia Sepsis present on admission Stable postextubation.  Now on room air - cultures returned resistant to unasyn. Change to CTX 6/20. Complete 6/25 - completed azithromycin - SLP evaluation- recommend NPO status  - now on comfort feeds - palliative consulted to discuss GOC with family  - pursuing hospice and comfort care - IVF discontinued. Was experiencing disorientation with the tubing - supportive care for secretions - wean from stress-dose steroids per pharmacy  Hypoglycemia- in setting of NPO - fluids with d5 now discontinued  UTI- good UOP. Foley in place for comfort care - coverage as above.   Dementia- stable. Oriented to self. Intermittently sedated  Thrombocytosis, JAK2 positive- platelets 550 Hold hydroxyurea for now  Body mass index is 20.66 kg/m.  VTE ppx: SCDs Start: 08/09/22 1004  Diet:     Diet   Diet NPO time specified Except for: Other (See Comments), Ice Chips   Consultants: [facility]CM Palliative   Subjective 08/16/22    Pt reports no complaints. He denies pain or SOB.    Objective   Vitals:   08/15/22 0452 08/15/22 0827 08/15/22 1958 08/15/22 2253  BP:  (!) 148/83 131/71   Pulse:  70 81 81  Resp:  18 20   Temp:  99 F (37.2 C) 97.6 F (36.4 C)   TempSrc:   Oral   SpO2:  95% (!) 22% 95%  Weight: 69.1 kg     Height:        Intake/Output Summary (Last 24 hours) at 08/16/2022 0809 Last data filed at 08/16/2022 0721 Gross per 24 hour  Intake 1525.85 ml  Output 1975 ml  Net -449.15 ml  Filed Weights   08/13/22 0500 08/14/22 0425 08/15/22 0452  Weight: 74.8 kg 66 kg 69.1 kg    Physical Exam:  General: awake, alert, NAD. HEENT: atraumatic, clear conjunctiva, anicteric sclera, MMM, hard of hearing Respiratory: normal respiratory effort. Cardiovascular: quick capillary refill Nervous: A&O x1. Extremities: moves all equally, no edema, low muscle tone Skin: dry, intact, normal temperature, normal color. No  rashes, lesions or ulcers on exposed skin Psychiatry: normal mood, congruent affect  Labs   I have personally reviewed the following labs and imaging studies CBC    Component Value Date/Time   WBC 12.8 (H) 08/13/2022 0918   RBC 4.87 08/13/2022 0918   HGB 13.1 08/13/2022 0918   HGB 11.5 (L) 02/10/2012 0444   HCT 40.0 08/13/2022 0918   HCT 31.6 (L) 02/10/2012 0444   PLT 702 (H) 08/13/2022 0918   PLT 410 02/10/2012 0444   MCV 82.1 08/13/2022 0918   MCV 86 02/10/2012 0444   MCH 26.9 08/13/2022 0918   MCHC 32.8 08/13/2022 0918   RDW 18.5 (H) 08/13/2022 0918   RDW 13.7 02/10/2012 0444   LYMPHSABS 2.0 08/09/2022 0659   LYMPHSABS 0.9 (L) 02/10/2012 0444   MONOABS 0.4 08/09/2022 0659   MONOABS 0.7 02/10/2012 0444   EOSABS 0.0 08/09/2022 0659   EOSABS 0.0 02/10/2012 0444   BASOSABS 0.0 08/09/2022 0659   BASOSABS 0.0 02/10/2012 0444      Latest Ref Rng & Units 08/13/2022    9:18 AM 08/12/2022    4:03 AM 08/11/2022    5:30 AM  BMP  Glucose 70 - 99 mg/dL 161  98  096   BUN 8 - 23 mg/dL 19  20  29    Creatinine 0.61 - 1.24 mg/dL 0.45  4.09  8.11   Sodium 135 - 145 mmol/L 137  141  146   Potassium 3.5 - 5.1 mmol/L 2.9  3.0  3.1   Chloride 98 - 111 mmol/L 99  105  110   CO2 22 - 32 mmol/L 27  27  27    Calcium 8.9 - 10.3 mg/dL 8.6  8.2  8.3    Disposition Plan & Communication  Patient status: Inpatient  Admitted From: Home Planned disposition location: Skilled nursing facility Anticipated discharge date: 6/26 pending clinical improvement, dispo decisions  Family Communication: family members meeting with palliative   Author: Leeroy Bock, DO [facility] 08/16/2022, 8:09 AM   Available by Epic secure chat 7AM-7PM. If 7PM-7AM, please contact night-coverage.  TRH contact information found on [email or web address].

## 2022-08-16 NOTE — TOC Progression Note (Signed)
Transition of Care [facility]) - Progression Note    Patient Details  Name: Marc Smith MRN: [id number] Date of Birth: 04-11-1945  Transition of Care [facility]) CM/SW Contact  Kreg Shropshire, RN Phone Number: 08/16/2022, 8:57 AM  Clinical Narrative:    [phone]- Spoke with Aleda Grana, Cm informed that we haven't had any success in SNF in the [place] area. Cm asked if we could extend the search to [facility]. He agreed to search and he wanted to talk to the rest of his family as well. Cm will update nephew with extended bed search.   Expected Discharge Plan:  (TBD) Barriers to Discharge: Continued Medical Work up  Expected Discharge Plan and Services     Post Acute Care Choice:  (TBD) Living arrangements for the past 2 months: Assisted Living Facility                                       Social Determinants of Health (SDOH) Interventions SDOH Screenings   Food Insecurity: No Food Insecurity (07/22/2022)  Housing: Patient Unable To Answer (07/22/2022)  Transportation Needs: No Transportation Needs (07/22/2022)  Utilities: Not At Risk (07/22/2022)  Depression (PHQ2-9): Low Risk  (07/22/2022)  Tobacco Use: High Risk (08/09/2022)    Readmission Risk Interventions     No data to display

## 2022-08-17 DIAGNOSIS — J690 Pneumonitis due to inhalation of food and vomit: Secondary | ICD-10-CM | POA: Diagnosis not present

## 2022-08-17 DIAGNOSIS — Z515 Encounter for palliative care: Secondary | ICD-10-CM | POA: Diagnosis not present

## 2022-08-17 DIAGNOSIS — A419 Sepsis, unspecified organism: Secondary | ICD-10-CM | POA: Diagnosis not present

## 2022-08-17 DIAGNOSIS — I214 Non-ST elevation (NSTEMI) myocardial infarction: Secondary | ICD-10-CM | POA: Diagnosis not present

## 2022-08-17 NOTE — Progress Notes (Signed)
PROGRESS NOTE    Marc Smith  WUJ:[id number] DOB: [birth year] DOA: 08/09/2022 PCP: Almetta Lovely, [facility]    Brief Narrative:  77 y.o. 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   They presented from nursing home to the ED on 08/09/2022 with AMS x 1 days.   In the ED, it was found that they had sepsis with hypoxia and respiratory distress so intubated and admitted to ICU.  Significant findings included  lactic acid 1.1, BUN/creatinine 41/1.60216, WBC 11.8, platelets 507. Urinalysis positive for infection. CT chest with airspace disease in the left lower lobe, patchy airspace opacities in the right lower lobe, thyroid nodule. CT head non acute.   They were initially treated with intubation, hydrocortisone, and antibiotics for PNA and UTI.   6/19: extubated. Continued on antibiotics for PNA and UTI 6/20- weaned to room air. Barium swallow study completed. SLP evaluated and due to high risk of aspiration, recommended keep NPO. Palliative consulted to discuss GOC.  6/21:stable. Remains NPO. Overnight, he had increased delirium requiring tele-sitter and mittens as well as haldol. He did not sustain injuries.  6/22: stable. No overnight events. Family meeting with palliative today. 6/23: further palliative and hospice discussions with family. Patient states he is hungry. Another episode of hypoglycemia so increased fluid rate. Completed course of azithromycin. Transitioned to comfort care. Able to tolerate comfort feeds.  6/24: clinically stable on room air for discharge to LTC hospice when available. 6/26: Stable, awaiting LTC placement with hospice   Assessment & Plan:   Principal Problem:   Aspiration pneumonia (HCC) Active Problems:   Sepsis with acute hypoxic respiratory failure and septic shock (HCC)   NSTEMI (non-ST elevated myocardial infarction) (HCC)  Full  comfort measures to continue.  Off medications not focused on patient comfort have been discontinued.  Patient is pending transfer to long-term care with hospice services.   DVT prophylaxis: None Code Status: DNR Family Communication: None Disposition Plan: Status is: Inpatient Remains inpatient appropriate because: Full comfort measures.  Pending transfer to long-term care with hospice services.   Level of care: Palliative Care  Consultants:  Palliative care  Procedures:  None  Antimicrobials: None   Subjective: Seen and examined.  Resting in bed.  No visible distress.  Frail-appearing.  Objective: Vitals:   08/15/22 1958 08/15/22 2253 08/16/22 0905 08/17/22 0728  BP: 131/71  107/70 118/69  Pulse: 81 81 95 95  Resp: 20  18 16   Temp: 97.6 F (36.4 C)  99 F (37.2 C) 97.8 F (36.6 C)  TempSrc: Oral     SpO2: (!) 22% 95% 96% 93%  Weight:      Height:        Intake/Output Summary (Last 24 hours) at 08/17/2022 1214 Last data filed at 08/17/2022 0700 Gross per 24 hour  Intake 0 ml  Output 900 ml  Net -900 ml   Filed Weights   08/13/22 0500 08/14/22 0425 08/15/22 0452  Weight: 74.8 kg 66 kg 69.1 kg    Examination:  Limited exam due to comfort measure status  General exam: NAD.  Appears frail Respiratory system: Clear to auscultation. Respiratory effort normal. Cardiovascular system: S1-S2, RRR, no murmurs Psychiatry: Judgement and insight appear impaired. Mood & affect flattened.     Data Reviewed: I have personally reviewed following labs and imaging studies  CBC: Recent Labs  Lab 08/11/22 0530 08/12/22 0403 08/13/22 0918  WBC 14.0* 13.1* 12.8*  HGB 10.5*  11.7* 13.1  HCT 33.0* 35.7* 40.0  MCV 83.3 81.3 82.1  PLT 444* 550* 702*   Basic Metabolic Panel: Recent Labs  Lab 08/11/22 0530 08/12/22 0403 08/13/22 0918  NA 146* 141 137  K 3.1* 3.0* 2.9*  CL 110 105 99  CO2 27 27 27   GLUCOSE 101* 98 116*  BUN 29* 20 19  CREATININE 0.83 0.65 0.64   CALCIUM 8.3* 8.2* 8.6*   GFR: Estimated Creatinine Clearance: 76.8 mL/min (by C-G formula based on SCr of 0.64 mg/dL). Liver Function Tests: No results for input(s): "AST", "ALT", "ALKPHOS", "BILITOT", "PROT", "ALBUMIN" in the last 168 hours. No results for input(s): "LIPASE", "AMYLASE" in the last 168 hours. No results for input(s): "AMMONIA" in the last 168 hours. Coagulation Profile: No results for input(s): "INR", "PROTIME" in the last 168 hours. Cardiac Enzymes: No results for input(s): "CKTOTAL", "CKMB", "CKMBINDEX", "TROPONINI" in the last 168 hours. BNP (last 3 results) No results for input(s): "PROBNP" in the last 8760 hours. HbA1C: No results for input(s): "HGBA1C" in the last 72 hours. CBG: Recent Labs  Lab 08/14/22 0447 08/14/22 0720 08/14/22 1124 08/14/22 1232 08/14/22 1626  GLUCAP 98 99 64* 159* 90   Lipid Profile: No results for input(s): "CHOL", "HDL", "LDLCALC", "TRIG", "CHOLHDL", "LDLDIRECT" in the last 72 hours. Thyroid Function Tests: No results for input(s): "TSH", "T4TOTAL", "FREET4", "T3FREE", "THYROIDAB" in the last 72 hours. Anemia Panel: No results for input(s): "VITAMINB12", "FOLATE", "FERRITIN", "TIBC", "IRON", "RETICCTPCT" in the last 72 hours. Sepsis Labs: No results for input(s): "PROCALCITON", "LATICACIDVEN" in the last 168 hours.  Recent Results (from the past 240 hour(s))  Blood Culture (routine x 2)     Status: None   Collection Time: 08/09/22  7:01 AM   Specimen: BLOOD  Result Value Ref Range Status   Specimen Description BLOOD LEFT ARM  Final   Special Requests   Final    BOTTLES DRAWN AEROBIC AND ANAEROBIC Blood Culture adequate volume   Culture   Final    NO GROWTH 5 DAYS Performed at [facility], [address], [place], [place] [address]    Report Status 08/14/2022 FINAL  Final  Blood Culture (routine x 2)     Status: None   Collection Time: 08/09/22  7:01 AM   Specimen: BLOOD  Result Value Ref Range Status    Specimen Description BLOOD RIGHT ARM  Final   Special Requests   Final    BOTTLES DRAWN AEROBIC AND ANAEROBIC Blood Culture adequate volume   Culture   Final    NO GROWTH 5 DAYS Performed at [facility], [address], [place], [place] [address]    Report Status 08/14/2022 FINAL  Final  Urine Culture     Status: Abnormal   Collection Time: 08/09/22  7:55 AM   Specimen: Urine, Random  Result Value Ref Range Status   Specimen Description   Final    URINE, RANDOM Performed at [facility], [address] [place], [place] [address]    Special Requests   Final    URINE, CATHETERIZED Performed at [facility], [address], [place], [place] [address]    Culture >=100,000 COLONIES/mL ESCHERICHIA COLI (A)  Final   Report Status 08/11/2022 FINAL  Final   Organism ID, Bacteria ESCHERICHIA COLI (A)  Final      Susceptibility   Escherichia coli - MIC*    AMPICILLIN >=32 RESISTANT Resistant     CEFAZOLIN <=4 SENSITIVE Sensitive  CEFEPIME <=0.12 SENSITIVE Sensitive     CEFTRIAXONE <=0.25 SENSITIVE Sensitive     CIPROFLOXACIN <=0.25 SENSITIVE Sensitive     GENTAMICIN >=16 RESISTANT Resistant     IMIPENEM <=0.25 SENSITIVE Sensitive     NITROFURANTOIN <=16 SENSITIVE Sensitive     TRIMETH/SULFA >=320 RESISTANT Resistant     AMPICILLIN/SULBACTAM 16 INTERMEDIATE Intermediate     PIP/TAZO <=4 SENSITIVE Sensitive     * >=100,000 COLONIES/mL ESCHERICHIA COLI  SARS Coronavirus 2 by RT PCR (hospital order, performed in [facility] hospital lab) *cepheid single result test* Anterior Nasal Swab     Status: None   Collection Time: 08/09/22  9:03 AM   Specimen: Anterior Nasal Swab  Result Value Ref Range Status   SARS Coronavirus 2 by RT PCR NEGATIVE NEGATIVE Final    Comment: (NOTE) SARS-CoV-2 target nucleic acids are NOT DETECTED.  The SARS-CoV-2 RNA is generally detectable in upper and lower respiratory specimens during the acute phase of infection. The  lowest concentration of SARS-CoV-2 viral copies this assay can detect is 250 copies / mL. A negative result does not preclude SARS-CoV-2 infection and should not be used as the sole basis for treatment or other patient management decisions.  A negative result may occur with improper specimen collection / handling, submission of specimen other than nasopharyngeal swab, presence of viral mutation(s) within the areas targeted by this assay, and inadequate number of viral copies (<250 copies / mL). A negative result must be combined with clinical observations, patient history, and epidemiological information.  Fact Sheet for Patients:   RoadLapTop.co.za  Fact Sheet for Healthcare Providers: http://kim-miller.com/  This test is not yet approved or  cleared by the [place] FDA and has been authorized for detection and/or diagnosis of SARS-CoV-2 by FDA under an Emergency Use Authorization (EUA).  This EUA will remain in effect (meaning this test can be used) for the duration of the COVID-19 declaration under Section 564(b)(1) of the Act, 21 U.S.C. section 360bbb-3(b)(1), unless the authorization is terminated or revoked sooner.  Performed at [facility], [address] [place], [place] [address]   MRSA Next Gen by PCR, Nasal     Status: None   Collection Time: 08/09/22 11:33 AM   Specimen: Nasal Mucosa; Nasal Swab  Result Value Ref Range Status   MRSA by PCR Next Gen NOT DETECTED NOT DETECTED Final    Comment: (NOTE) The GeneXpert MRSA Assay (FDA approved for NASAL specimens only), is one component of a comprehensive MRSA colonization surveillance program. It is not intended to diagnose MRSA infection nor to guide or monitor treatment for MRSA infections. Test performance is not FDA approved in patients less than 13 years old. Performed at [facility], [address] [place], [place] [address]   Culture, blood  (Routine X 2) w Reflex to ID Panel     Status: None   Collection Time: 08/09/22 11:42 AM   Specimen: BLOOD  Result Value Ref Range Status   Specimen Description BLOOD BLOOD LEFT HAND  Final   Special Requests   Final    BOTTLES DRAWN AEROBIC AND ANAEROBIC Blood Culture adequate volume   Culture   Final    NO GROWTH 5 DAYS Performed at [facility], [address], [place], [place] [address]    Report Status 08/14/2022 FINAL  Final  Culture, blood (Routine X 2) w Reflex to ID Panel     Status: None   Collection Time: 08/09/22 11:51 AM   Specimen:  BLOOD  Result Value Ref Range Status   Specimen Description BLOOD BLOOD RIGHT HAND  Final   Special Requests   Final    BOTTLES DRAWN AEROBIC AND ANAEROBIC Blood Culture adequate volume   Culture   Final    NO GROWTH 5 DAYS Performed at [facility], [address], [place], [place] [address]    Report Status 08/14/2022 FINAL  Final         Radiology Studies: No results found.      Scheduled Meds:  nicotine  7 mg Transdermal Daily   Continuous Infusions:  sodium chloride 10 mL/hr at 08/16/22 0721     LOS: 8 days       Tresa Moore, MD [facility]   If 7PM-7AM, please contact night-coverage  08/17/2022, 12:14 PM

## 2022-08-17 NOTE — Progress Notes (Signed)
                                                     Palliative Care Progress Note, Assessment & Plan   Patient Name: Marc Smith       Date: 08/17/2022 DOB: [birth year]  Age: 77 y.o. MRN#: [id number] Attending Physician: Tresa Moore, MD Primary Care Physician: [facility] Admit Date: 08/09/2022  Subjective: Patient is sitting up in bed in no apparent distress.  His breakfast tray is at bedside and he is attempting to feed himself.  No family or friends present during my visit.  HPI: 77 y.o. 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 Smith [name] with oncology), HTN, HLD, dementia and gout admitted from [facility] on 08/09/2022 with altered mental status. Reports from facility, found to be febrile and minimally responsive prompting EMS to be called.   Found to have sepsis secondary to aspiration PNA, UTI, required intubation in ED, successfully extubated 6/19   Concern for recurrent aspiration, failed ST evaluation 6/20 remains NPO   CT head 6/18 IMPRESSION: 1. No evidence of acute intracranial abnormality. 2. Severe chronic microvascular ischemic disease.   Palliative medicine was consulted for assisting with goals of care conversations.  Summary of counseling/coordination of care: After reviewing the patient's chart and assessing the patient at bedside, I spoke with patient in regards to symptom management.  Patient shares he is done with his breakfast.  He says he "had enough".  I assessed patient's symptoms.  We discussed if he was in discomfort or pain.  He denied any acute complaints at this time.  No adjustment to [facility] needed.  Plan remains for patient to transfer to LTC with hospice services to follow.  DNR and MOST form are completed (please  see ACP tab in epic for details".  PMT will continue to follow and support patient throughout his hospitalization.  Physical Exam Vitals reviewed.  Constitutional:      General: He is not in acute distress.    Appearance: He is normal weight. He is not ill-appearing.  HENT:     Head: Normocephalic.     Mouth/Throat:     Mouth: Mucous membranes are moist.     Comments: Poor dentition - missing teeth Eyes:     Pupils: Pupils are equal, round, and reactive to light.  Pulmonary:     Effort: Pulmonary effort is normal.  Abdominal:     Palpations: Abdomen is soft.  Musculoskeletal:     Comments: Generalized weakness  Skin:    General: Skin is warm and dry.  Neurological:     Mental Status: He is alert.     Comments: Oriented to self  Psychiatric:        Mood and Affect: Mood normal.        Judgment: Judgment normal.             Total Time 25 minutes   Crissie Aloi L. Manon Hilding, FNP-BC Palliative Medicine Team Team Phone # [phone]

## 2022-08-17 NOTE — Progress Notes (Signed)
[employer] [facility]) Hospice hospital liaison note   Patient for hospice services in LTC at discharge.  Liaison continues to follow for discharge disposition.    [facility] Liaison [phone]

## 2022-08-17 NOTE — TOC Progression Note (Signed)
Transition of Care [facility]) - Progression Note    Patient Details  Name: Marc Smith MRN: [id number] Date of Birth: [birth year]  Transition of Care [facility]) CM/SW Contact  Allena Katz, LCSW Phone Number: 08/17/2022, 2:16 PM  Clinical Narrative:   CSW spoke with nephew about bed offers. Nephew states if we can't find a place in [place] he would like to see if patient can return to the [facility] with hospice. CSW lvm with [facility].    Expected Discharge Plan:  (TBD) Barriers to Discharge: Continued Medical Work up  Expected Discharge Plan and Services     Post Acute Care Choice:  (TBD) Living arrangements for the past 2 months: Assisted Living Facility                                       Social Determinants of Health (SDOH) Interventions SDOH Screenings   Food Insecurity: No Food Insecurity (07/22/2022)  Housing: Patient Unable To Answer (07/22/2022)  Transportation Needs: No Transportation Needs (07/22/2022)  Utilities: Not At Risk (07/22/2022)  Depression (PHQ2-9): Low Risk  (07/22/2022)  Tobacco Use: High Risk (08/09/2022)    Readmission Risk Interventions     No data to display

## 2022-08-17 NOTE — TOC Progression Note (Signed)
Transition of Care [facility]) - Progression Note    Patient Details  Name: Marc Smith MRN: [id number] Date of Birth: [birth year]  Transition of Care [facility]) CM/SW Contact  Allena Katz, LCSW Phone Number: 08/17/2022, 10:47 AM  Clinical Narrative:   CSW spoke with Revonda Standard from [place] who reports she is reviewing financials for pt to come LTC with hospice.     Expected Discharge Plan:  (TBD) Barriers to Discharge: Continued Medical Work up  Expected Discharge Plan and Services     Post Acute Care Choice:  (TBD) Living arrangements for the past 2 months: Assisted Living Facility                                       Social Determinants of Health (SDOH) Interventions SDOH Screenings   Food Insecurity: No Food Insecurity (07/22/2022)  Housing: Patient Unable To Answer (07/22/2022)  Transportation Needs: No Transportation Needs (07/22/2022)  Utilities: Not At Risk (07/22/2022)  Depression (PHQ2-9): Low Risk  (07/22/2022)  Tobacco Use: High Risk (08/09/2022)    Readmission Risk Interventions     No data to display

## 2022-08-17 NOTE — Care Management Important Message (Signed)
Important Message  Patient Details  Name: Marc Smith MRN: [id number] Date of Birth: 11/25/1945   Medicare Important Message Given:  Other (see comment)  Patient is on comfort care measures and to discharge with hospice. Out of respect for the patient and family no Important Message from [facility] given today.   Olegario Messier A Sven Pinheiro 08/17/2022, 7:48 AM

## 2022-08-18 DIAGNOSIS — J690 Pneumonitis due to inhalation of food and vomit: Secondary | ICD-10-CM | POA: Diagnosis not present

## 2022-08-18 DIAGNOSIS — I214 Non-ST elevation (NSTEMI) myocardial infarction: Secondary | ICD-10-CM | POA: Diagnosis not present

## 2022-08-18 DIAGNOSIS — A419 Sepsis, unspecified organism: Secondary | ICD-10-CM | POA: Diagnosis not present

## 2022-08-18 DIAGNOSIS — Z515 Encounter for palliative care: Secondary | ICD-10-CM | POA: Diagnosis not present

## 2022-08-18 NOTE — TOC Progression Note (Signed)
Transition of Care [facility]) - Progression Note    Patient Details  Name: Marc Smith MRN: [id number] Date of Birth: December 06, 1945  Transition of Care [facility]) CM/SW Contact  Allena Katz, LCSW Phone Number: 08/18/2022, 3:00 PM  Clinical Narrative:   CSW spoke with nephew who reports they would like [place] area since no where in [place] have accepted. [facility] lvm with [facility] [place].     Expected Discharge Plan:  (TBD) Barriers to Discharge: Continued Medical Work up  Expected Discharge Plan and Services     Post Acute Care Choice:  (TBD) Living arrangements for the past 2 months: Assisted Living Facility                                       Social Determinants of Health (SDOH) Interventions SDOH Screenings   Food Insecurity: No Food Insecurity (07/22/2022)  Housing: Patient Unable To Answer (07/22/2022)  Transportation Needs: No Transportation Needs (07/22/2022)  Utilities: Not At Risk (07/22/2022)  Depression (PHQ2-9): Low Risk  (07/22/2022)  Tobacco Use: High Risk (08/09/2022)    Readmission Risk Interventions     No data to display

## 2022-08-18 NOTE — TOC Progression Note (Signed)
Transition of Care [facility]) - Progression Note    Patient Details  Name: Marc Smith MRN: [id number] Date of Birth: 06/03/1945  Transition of Care [facility]) CM/SW Contact  Allena Katz, LCSW Phone Number: 08/18/2022, 11:24 AM  Clinical Narrative:   CSW LVM with daughter [name] [phone] to inform her the [facility] is unable to take patient back at this time. CSW lvm with [facility] in [place] per her request. Daughter and son to call me back today with a decision.      Expected Discharge Plan:  (TBD) Barriers to Discharge: Continued Medical Work up  Expected Discharge Plan and Services     Post Acute Care Choice:  (TBD) Living arrangements for the past 2 months: Assisted Living Facility                                       Social Determinants of Health (SDOH) Interventions SDOH Screenings   Food Insecurity: No Food Insecurity (07/22/2022)  Housing: Patient Unable To Answer (07/22/2022)  Transportation Needs: No Transportation Needs (07/22/2022)  Utilities: Not At Risk (07/22/2022)  Depression (PHQ2-9): Low Risk  (07/22/2022)  Tobacco Use: High Risk (08/09/2022)    Readmission Risk Interventions     No data to display

## 2022-08-18 NOTE — Progress Notes (Signed)
Palliative Care Progress Note, Assessment & Plan   Patient Name: Marc Marc       Date: 08/18/2022 DOB: 07/10/1945  Age: 77 y.o. MRN#: [id number] Attending Physician: Marc Moore, MD Primary Care Physician: [facility] Admit Date: 08/09/2022  Subjective: Patient is sitting up in bed.  He acknowledges my presence.  He is able to make his wishes known.  He is alert to self but unable to share the reasons why he is in the hospital or his current medical situation.  His niece (NOT Marc Marc) is at bedside with his nephew Marc Marc on speaker phone during my visit.  HPI: 77 y.o. 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 Smith Marc with oncology), HTN, HLD, dementia and gout admitted from [facility] on 08/09/2022 with altered mental status. Reports from facility, found to be febrile and minimally responsive prompting EMS to be called.   Found to have sepsis secondary to aspiration PNA, UTI, required intubation in ED, successfully extubated 6/19   Concern for recurrent aspiration, failed ST evaluation 6/20 remains NPO   CT head 6/18 IMPRESSION: 1. No evidence of acute intracranial abnormality. 2. Severe chronic microvascular ischemic disease.   Palliative medicine was consulted for assisting with goals of care conversations.  Summary of counseling/coordination of care: After reviewing the patient's chart and assessing the patient at bedside, I spoke with Marc Marc via speaker phone in regards to plan and goals of care.  Marc Marc inquired about discharge planning.  We discussed that TOC is following closely and should be reaching out to them in regards to decision soon.  Symptoms assessed.  Patient has no acute  physical complaints at this time.  Family asked if patient's diet has been advanced.  We discussed that patient has comfort feeds and I dysphagia 3 diet.  When patient is alert and able to take in p.o.'s that he is being given those.  Patient endorses that he is not hungry at this time.  Family also endorses patient has been able to eat but that they have noticed some signs of coughing and spitting up after he eats.  We discussed again that comfort feeds take into account the risk of aspiration reoccurring but that the joy and pleasure that comes from food has now taken priority.  Patient's family confirmed understanding.  Marc Marc asks if patient is taking his regular medications.  I reiterated that patient has been shifted to full comfort measures several days ago.  Medications given are now solely focused on comfort.  I reviewed the patient's current blood pressure is within normal limits.  Should patient begin to experience chest pain, headache, or other acute complaints then those will appropriately and aggressively be addressed.  Marc Marc expresses that he is appreciative of the care his uncle is getting and is hopeful to find a space for him soon so he can discharge out of the hospital.  TOC continues to follow closely for discharge planning.  PMT will continue to follow and remain available to patient and family throughout his hospitalization.  Physical Exam Constitutional:      General: He is not in acute distress.  Appearance: He is normal weight.  HENT:     Head: Normocephalic.     Nose: Nose normal.     Mouth/Throat:     Mouth: Mucous membranes are moist.     Pharynx: Oropharynx is clear.     Comments: Poor dentition Pulmonary:     Effort: Pulmonary effort is normal.  Abdominal:     Palpations: Abdomen is soft.  Musculoskeletal:     Comments: Generalized weakness  Skin:    General: Skin is warm and dry.  Neurological:     Mental Status: He is alert.     Comments: Oriented to self   Psychiatric:        Mood and Affect: Mood normal.        Behavior: Behavior normal.             Total Time 25 minutes   Marc Marc L. Marc Hilding, FNP-BC Palliative Medicine Team Team Phone # [phone]

## 2022-08-18 NOTE — Progress Notes (Signed)
PROGRESS NOTE    Marc Smith  ZOX:[id number] DOB: 07/24/1945 DOA: 08/09/2022 PCP: Almetta Lovely, [facility]    Brief Narrative:  77 y.o. 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   They presented from nursing home to the ED on 08/09/2022 with AMS x 1 days.   In the ED, it was found that they had sepsis with hypoxia and respiratory distress so intubated and admitted to ICU.  Significant findings included  lactic acid 1.1, BUN/creatinine 41/1.60216, WBC 11.8, platelets 507. Urinalysis positive for infection. CT chest with airspace disease in the left lower lobe, patchy airspace opacities in the right lower lobe, thyroid nodule. CT head non acute.   They were initially treated with intubation, hydrocortisone, and antibiotics for PNA and UTI.   6/19: extubated. Continued on antibiotics for PNA and UTI 6/20- weaned to room air. Barium swallow study completed. SLP evaluated and due to high risk of aspiration, recommended keep NPO. Palliative consulted to discuss GOC.  6/21:stable. Remains NPO. Overnight, he had increased delirium requiring tele-sitter and mittens as well as haldol. He did not sustain injuries.  6/22: stable. No overnight events. Family meeting with palliative today. 6/23: further palliative and hospice discussions with family. Patient states he is hungry. Another episode of hypoglycemia so increased fluid rate. Completed course of azithromycin. Transitioned to comfort care. Able to tolerate comfort feeds.  6/24: clinically stable on room air for discharge to LTC hospice when available. 6/26: Stable, awaiting LTC placement with hospice   Assessment & Plan:   Principal Problem:   Aspiration pneumonia (HCC) Active Problems:   Sepsis with acute hypoxic respiratory failure and septic shock (HCC)   NSTEMI (non-ST elevated myocardial infarction) (HCC)  Full  comfort measures to continue.  Off medications not focused on patient comfort have been discontinued.  Patient is pending transfer to long-term care with hospice services.  Discussed with TOC, may be able to return to [place] [facility] [place] with hospice.  Discharge plan pending   DVT prophylaxis: None Code Status: DNR Family Communication: None Disposition Plan: Status is: Inpatient Remains inpatient appropriate because: Full comfort measures.  Pending transfer to long-term care with hospice services.   Level of care: Palliative Care  Consultants:  Palliative care  Procedures:  None  Antimicrobials: None   Subjective: No acute overnight events  Objective: Vitals:   08/15/22 2253 08/16/22 0905 08/17/22 0728 08/18/22 0532  BP:  107/70 118/69 120/74  Pulse: 81 95 95 92  Resp:  18 16 (!) 24  Temp:  99 F (37.2 C) 97.8 F (36.6 C) 98 F (36.7 C)  TempSrc:    Oral  SpO2: 95% 96% 93% 96%  Weight:      Height:        Intake/Output Summary (Last 24 hours) at 08/18/2022 1052 Last data filed at 08/18/2022 1043 Gross per 24 hour  Intake 190 ml  Output 300 ml  Net -110 ml   Filed Weights   08/13/22 0500 08/14/22 0425 08/15/22 0452  Weight: 74.8 kg 66 kg 69.1 kg    Examination:  Limited exam due to comfort measure status  General exam: NAD. Frail Respiratory system: Clear to auscultation. Respiratory effort normal. Cardiovascular system: S1-S2, RRR, no murmurs Psychiatry: Judgement and insight appear impaired. Mood & affect flattened.     Data Reviewed: I have personally reviewed following labs and imaging studies  CBC: Recent Labs  Lab 08/12/22 0403 08/13/22 0918  WBC  13.1* 12.8*  HGB 11.7* 13.1  HCT 35.7* 40.0  MCV 81.3 82.1  PLT 550* 702*   Basic Metabolic Panel: Recent Labs  Lab 08/12/22 0403 08/13/22 0918  NA 141 137  K 3.0* 2.9*  CL 105 99  CO2 27 27  GLUCOSE 98 116*  BUN 20 19  CREATININE 0.65 0.64  CALCIUM 8.2* 8.6*   GFR: Estimated  Creatinine Clearance: 76.8 mL/min (by C-G formula based on SCr of 0.64 mg/dL). Liver Function Tests: No results for input(s): "AST", "ALT", "ALKPHOS", "BILITOT", "PROT", "ALBUMIN" in the last 168 hours. No results for input(s): "LIPASE", "AMYLASE" in the last 168 hours. No results for input(s): "AMMONIA" in the last 168 hours. Coagulation Profile: No results for input(s): "INR", "PROTIME" in the last 168 hours. Cardiac Enzymes: No results for input(s): "CKTOTAL", "CKMB", "CKMBINDEX", "TROPONINI" in the last 168 hours. BNP (last 3 results) No results for input(s): "PROBNP" in the last 8760 hours. HbA1C: No results for input(s): "HGBA1C" in the last 72 hours. CBG: Recent Labs  Lab 08/14/22 0447 08/14/22 0720 08/14/22 1124 08/14/22 1232 08/14/22 1626  GLUCAP 98 99 64* 159* 90   Lipid Profile: No results for input(s): "CHOL", "HDL", "LDLCALC", "TRIG", "CHOLHDL", "LDLDIRECT" in the last 72 hours. Thyroid Function Tests: No results for input(s): "TSH", "T4TOTAL", "FREET4", "T3FREE", "THYROIDAB" in the last 72 hours. Anemia Panel: No results for input(s): "VITAMINB12", "FOLATE", "FERRITIN", "TIBC", "IRON", "RETICCTPCT" in the last 72 hours. Sepsis Labs: No results for input(s): "PROCALCITON", "LATICACIDVEN" in the last 168 hours.  Recent Results (from the past 240 hour(s))  Blood Culture (routine x 2)     Status: None   Collection Time: 08/09/22  7:01 AM   Specimen: BLOOD  Result Value Ref Range Status   Specimen Description BLOOD LEFT ARM  Final   Special Requests   Final    BOTTLES DRAWN AEROBIC AND ANAEROBIC Blood Culture adequate volume   Culture   Final    NO GROWTH 5 DAYS Performed at [facility], [address], [place], [place] [address]    Report Status 08/14/2022 FINAL  Final  Blood Culture (routine x 2)     Status: None   Collection Time: 08/09/22  7:01 AM   Specimen: BLOOD  Result Value Ref Range Status   Specimen Description BLOOD RIGHT ARM  Final    Special Requests   Final    BOTTLES DRAWN AEROBIC AND ANAEROBIC Blood Culture adequate volume   Culture   Final    NO GROWTH 5 DAYS Performed at [facility], [address], [place], [place] [address]    Report Status 08/14/2022 FINAL  Final  Urine Culture     Status: Abnormal   Collection Time: 08/09/22  7:55 AM   Specimen: Urine, Random  Result Value Ref Range Status   Specimen Description   Final    URINE, RANDOM Performed at [facility], [address], [place], [place] [address]    Special Requests   Final    URINE, CATHETERIZED Performed at [facility], [address] [place], [place] [address]    Culture >=100,000 COLONIES/mL ESCHERICHIA COLI (A)  Final   Report Status 08/11/2022 FINAL  Final   Organism ID, Bacteria ESCHERICHIA COLI (A)  Final      Susceptibility   Escherichia coli - MIC*    AMPICILLIN >=32 RESISTANT Resistant     CEFAZOLIN <=4 SENSITIVE Sensitive     CEFEPIME <=0.12 SENSITIVE Sensitive  CEFTRIAXONE <=0.25 SENSITIVE Sensitive     CIPROFLOXACIN <=0.25 SENSITIVE Sensitive     GENTAMICIN >=16 RESISTANT Resistant     IMIPENEM <=0.25 SENSITIVE Sensitive     NITROFURANTOIN <=16 SENSITIVE Sensitive     TRIMETH/SULFA >=320 RESISTANT Resistant     AMPICILLIN/SULBACTAM 16 INTERMEDIATE Intermediate     PIP/TAZO <=4 SENSITIVE Sensitive     * >=100,000 COLONIES/mL ESCHERICHIA COLI  SARS Coronavirus 2 by RT PCR (hospital order, performed in [facility] hospital lab) *cepheid single result test* Anterior Nasal Swab     Status: None   Collection Time: 08/09/22  9:03 AM   Specimen: Anterior Nasal Swab  Result Value Ref Range Status   SARS Coronavirus 2 by RT PCR NEGATIVE NEGATIVE Final    Comment: (NOTE) SARS-CoV-2 target nucleic acids are NOT DETECTED.  The SARS-CoV-2 RNA is generally detectable in upper and lower respiratory specimens during the acute phase of infection. The lowest concentration of SARS-CoV-2 viral copies this  assay can detect is 250 copies / mL. A negative result does not preclude SARS-CoV-2 infection and should not be used as the sole basis for treatment or other patient management decisions.  A negative result may occur with improper specimen collection / handling, submission of specimen other than nasopharyngeal swab, presence of viral mutation(s) within the areas targeted by this assay, and inadequate number of viral copies (<250 copies / mL). A negative result must be combined with clinical observations, patient history, and epidemiological information.  Fact Sheet for Patients:   RoadLapTop.co.za  Fact Sheet for Healthcare Providers: http://kim-miller.com/  This test is not yet approved or  cleared by the [place] FDA and has been authorized for detection and/or diagnosis of SARS-CoV-2 by FDA under an Emergency Use Authorization (EUA).  This EUA will remain in effect (meaning this test can be used) for the duration of the COVID-19 declaration under Section 564(b)(1) of the Act, 21 U.S.C. section 360bbb-3(b)(1), unless the authorization is terminated or revoked sooner.  Performed at [facility], [address] [place], [place] [address]   MRSA Next Gen by PCR, Nasal     Status: None   Collection Time: 08/09/22 11:33 AM   Specimen: Nasal Mucosa; Nasal Swab  Result Value Ref Range Status   MRSA by PCR Next Gen NOT DETECTED NOT DETECTED Final    Comment: (NOTE) The GeneXpert MRSA Assay (FDA approved for NASAL specimens only), is one component of a comprehensive MRSA colonization surveillance program. It is not intended to diagnose MRSA infection nor to guide or monitor treatment for MRSA infections. Test performance is not FDA approved in patients less than 61 years old. Performed at [facility], [address] [place], [place] [address]   Culture, blood (Routine X 2) w Reflex to ID Panel     Status: None    Collection Time: 08/09/22 11:42 AM   Specimen: BLOOD  Result Value Ref Range Status   Specimen Description BLOOD BLOOD LEFT HAND  Final   Special Requests   Final    BOTTLES DRAWN AEROBIC AND ANAEROBIC Blood Culture adequate volume   Culture   Final    NO GROWTH 5 DAYS Performed at [facility], [address], [place], [place] [address]    Report Status 08/14/2022 FINAL  Final  Culture, blood (Routine X 2) w Reflex to ID Panel     Status: None   Collection Time: 08/09/22 11:51 AM   Specimen: BLOOD  Result Value Ref Range Status  Specimen Description BLOOD BLOOD RIGHT HAND  Final   Special Requests   Final    BOTTLES DRAWN AEROBIC AND ANAEROBIC Blood Culture adequate volume   Culture   Final    NO GROWTH 5 DAYS Performed at [facility], [address], [place], [place] [address]    Report Status 08/14/2022 FINAL  Final         Radiology Studies: No results found.      Scheduled Meds:  nicotine  7 mg Transdermal Daily   Continuous Infusions:  sodium chloride 10 mL/hr at 08/16/22 0721     LOS: 9 days       Tresa Moore, MD [facility]   If 7PM-7AM, please contact night-coverage  08/18/2022, 10:52 AM

## 2022-08-18 NOTE — TOC Progression Note (Signed)
Transition of Care [facility]) - Progression Note    Patient Details  Name: Marc Smith MRN: [id number] Date of Birth: 11-13-1945  [facility] CM/SW Contact  Allena Katz, LCSW Phone Number: 08/18/2022, 8:40 AM  Clinical Narrative: CSW spoke with nephew and sister regarding bed offers. Both state they will call me today with a  decision. They state they would like to see if patient can go back to the [facility]. CSW left two voicemail's at the [facility].     Expected Discharge Plan:  (TBD) Barriers to Discharge: Continued Medical Work up  Expected Discharge Plan and Services     Post Acute Care Choice:  (TBD) Living arrangements for the past 2 months: Assisted Living Facility                                       Social Determinants of Health (SDOH) Interventions SDOH Screenings   Food Insecurity: No Food Insecurity (07/22/2022)  Housing: Patient Unable To Answer (07/22/2022)  Transportation Needs: No Transportation Needs (07/22/2022)  Utilities: Not At Risk (07/22/2022)  Depression (PHQ2-9): Low Risk  (07/22/2022)  Tobacco Use: High Risk (08/09/2022)    Readmission Risk Interventions     No data to display

## 2022-08-19 DIAGNOSIS — J690 Pneumonitis due to inhalation of food and vomit: Secondary | ICD-10-CM | POA: Diagnosis not present

## 2022-08-19 NOTE — Progress Notes (Signed)
PROGRESS NOTE    Marc Smith  ZOX:[id number] DOB: 11/12/1945 DOA: 08/09/2022 PCP: Almetta Lovely, [facility]    Brief Narrative:  77 y.o. 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   They presented from nursing home to the ED on 08/09/2022 with AMS x 1 days.   In the ED, it was found that they had sepsis with hypoxia and respiratory distress so intubated and admitted to ICU.  Significant findings included  lactic acid 1.1, BUN/creatinine 41/1.60216, WBC 11.8, platelets 507. Urinalysis positive for infection. CT chest with airspace disease in the left lower lobe, patchy airspace opacities in the right lower lobe, thyroid nodule. CT head non acute.   They were initially treated with intubation, hydrocortisone, and antibiotics for PNA and UTI.   6/19: extubated. Continued on antibiotics for PNA and UTI 6/20- weaned to room air. Barium swallow study completed. SLP evaluated and due to high risk of aspiration, recommended keep NPO. Palliative consulted to discuss GOC.  6/21:stable. Remains NPO. Overnight, he had increased delirium requiring tele-sitter and mittens as well as haldol. He did not sustain injuries.  6/22: stable. No overnight events. Family meeting with palliative today. 6/23: further palliative and hospice discussions with family. Patient states he is hungry. Another episode of hypoglycemia so increased fluid rate. Completed course of azithromycin. Transitioned to comfort care. Able to tolerate comfort feeds.  6/24: clinically stable on room air for discharge to LTC hospice when available. 6/26: Stable, awaiting LTC placement with hospice   Assessment & Plan:   Principal Problem:   Aspiration pneumonia (HCC) Active Problems:   Sepsis with acute hypoxic respiratory failure and septic shock (HCC)   NSTEMI (non-ST elevated myocardial infarction) (HCC)  Full  comfort measures to continue.  Off medications not focused on patient comfort have been discontinued.  Patient is pending transfer to long-term care with hospice services.  Discussed with TOC, may be able to return to [place] [facility] [place] with hospice.  [facility] in  evaluating patients medicaid.  DC plan pending   DVT prophylaxis: None Code Status: DNR Family Communication: None Disposition Plan: Status is: Inpatient Remains inpatient appropriate because: Full comfort measures.  Pending transfer to long-term care with hospice services.   Level of care: Palliative Care  Consultants:  Palliative care  Procedures:  None  Antimicrobials: None   Subjective: No acute overnight events  Objective: Vitals:   08/17/22 0728 08/18/22 0532 08/18/22 1543 08/18/22 1957  BP: 118/69 120/74 (!) 115/54 (!) 96/57  Pulse: 95 92 95 82  Resp: 16 (!) 24 16 17   Temp: 97.8 F (36.6 C) 98 F (36.7 C) 98.6 F (37 C) 98.9 F (37.2 C)  TempSrc:  Oral  Oral  SpO2: 93% 96% 97% 95%  Weight:      Height:        Intake/Output Summary (Last 24 hours) at 08/19/2022 1108 Last data filed at 08/19/2022 0900 Gross per 24 hour  Intake 240 ml  Output 375 ml  Net -135 ml   Filed Weights   08/13/22 0500 08/14/22 0425 08/15/22 0452  Weight: 74.8 kg 66 kg 69.1 kg    Examination:  Limited exam due to comfort measure status  General exam: NAD. Frail appearing Respiratory system: Clear to auscultation. Respiratory effort normal. Cardiovascular system: S1-S2, RRR, no murmurs Psychiatry: Judgement and insight appear impaired. Mood & affect flattened.     Data Reviewed: I have personally reviewed following labs and imaging  studies  CBC: Recent Labs  Lab 08/13/22 0918  WBC 12.8*  HGB 13.1  HCT 40.0  MCV 82.1  PLT 702*   Basic Metabolic Panel: Recent Labs  Lab 08/13/22 0918  NA 137  K 2.9*  CL 99  CO2 27  GLUCOSE 116*  BUN 19  CREATININE 0.64  CALCIUM 8.6*   GFR: Estimated  Creatinine Clearance: 76.8 mL/min (by C-G formula based on SCr of 0.64 mg/dL). Liver Function Tests: No results for input(s): "AST", "ALT", "ALKPHOS", "BILITOT", "PROT", "ALBUMIN" in the last 168 hours. No results for input(s): "LIPASE", "AMYLASE" in the last 168 hours. No results for input(s): "AMMONIA" in the last 168 hours. Coagulation Profile: No results for input(s): "INR", "PROTIME" in the last 168 hours. Cardiac Enzymes: No results for input(s): "CKTOTAL", "CKMB", "CKMBINDEX", "TROPONINI" in the last 168 hours. BNP (last 3 results) No results for input(s): "PROBNP" in the last 8760 hours. HbA1C: No results for input(s): "HGBA1C" in the last 72 hours. CBG: Recent Labs  Lab 08/14/22 0447 08/14/22 0720 08/14/22 1124 08/14/22 1232 08/14/22 1626  GLUCAP 98 99 64* 159* 90   Lipid Profile: No results for input(s): "CHOL", "HDL", "LDLCALC", "TRIG", "CHOLHDL", "LDLDIRECT" in the last 72 hours. Thyroid Function Tests: No results for input(s): "TSH", "T4TOTAL", "FREET4", "T3FREE", "THYROIDAB" in the last 72 hours. Anemia Panel: No results for input(s): "VITAMINB12", "FOLATE", "FERRITIN", "TIBC", "IRON", "RETICCTPCT" in the last 72 hours. Sepsis Labs: No results for input(s): "PROCALCITON", "LATICACIDVEN" in the last 168 hours.  Recent Results (from the past 240 hour(s))  MRSA Next Gen by PCR, Nasal     Status: None   Collection Time: 08/09/22 11:33 AM   Specimen: Nasal Mucosa; Nasal Swab  Result Value Ref Range Status   MRSA by PCR Next Gen NOT DETECTED NOT DETECTED Final    Comment: (NOTE) The GeneXpert MRSA Assay (FDA approved for NASAL specimens only), is one component of a comprehensive MRSA colonization surveillance program. It is not intended to diagnose MRSA infection nor to guide or monitor treatment for MRSA infections. Test performance is not FDA approved in patients less than 19 years old. Performed at [facility], [address] [place], [place]  [address]   Culture, blood (Routine X 2) w Reflex to ID Panel     Status: None   Collection Time: 08/09/22 11:42 AM   Specimen: BLOOD  Result Value Ref Range Status   Specimen Description BLOOD BLOOD LEFT HAND  Final   Special Requests   Final    BOTTLES DRAWN AEROBIC AND ANAEROBIC Blood Culture adequate volume   Culture   Final    NO GROWTH 5 DAYS Performed at [facility], [address], [place], [place] [address]    Report Status 08/14/2022 FINAL  Final  Culture, blood (Routine X 2) w Reflex to ID Panel     Status: None   Collection Time: 08/09/22 11:51 AM   Specimen: BLOOD  Result Value Ref Range Status   Specimen Description BLOOD BLOOD RIGHT HAND  Final   Special Requests   Final    BOTTLES DRAWN AEROBIC AND ANAEROBIC Blood Culture adequate volume   Culture   Final    NO GROWTH 5 DAYS Performed at [facility], [address] [place], [place] [address]    Report Status 08/14/2022 FINAL  Final         Radiology Studies: No results found.      Scheduled Meds:  nicotine  7 mg Transdermal Daily  Continuous Infusions:  sodium chloride 10 mL/hr at 08/16/22 0721     LOS: 10 days       Tresa Moore, MD [facility]   If 7PM-7AM, please contact night-coverage  08/19/2022, 11:08 AM

## 2022-08-19 NOTE — TOC Progression Note (Signed)
Transition of Care [facility]) - Progression Note    Patient Details  Name: Marc Smith MRN: [id number] Date of Birth: [birth year]  [facility]) CM/SW Contact  Allena Katz, LCSW Phone Number: 08/19/2022, 10:32 AM  Clinical Narrative:  [facility] in [place] [phone]) looking at patients medicaid to see if they are able to take.     Expected Discharge Plan:  (TBD) Barriers to Discharge: Continued Medical Work up  Expected Discharge Plan and Services     Post Acute Care Choice:  (TBD) Living arrangements for the past 2 months: Assisted Living Facility                                       Social Determinants of Health (SDOH) Interventions SDOH Screenings   Food Insecurity: No Food Insecurity (07/22/2022)  Housing: Patient Unable To Answer (07/22/2022)  Transportation Needs: No Transportation Needs (07/22/2022)  Utilities: Not At Risk (07/22/2022)  Depression (PHQ2-9): Low Risk  (07/22/2022)  Tobacco Use: High Risk (08/09/2022)    Readmission Risk Interventions     No data to display

## 2022-08-19 NOTE — Progress Notes (Addendum)
[employer] [facility]) Hospice hospital liaison note   Long term care bed search continues.  Liaison will continue to follow for discharge disposition.    Please do not hesitate to call for any Hospice related questions.    [facility] Liaison [phone]

## 2022-08-19 NOTE — Progress Notes (Signed)
                                                                                                                                                                                                           Daily Progress Note   Patient Name: Marc Marc       Date: 08/19/2022 DOB: 1945/02/26  Age: 77 y.o. MRN#: [id number] Attending Physician: Marc Moore, MD Primary Care Physician: Marc Marc, [facility] Admit Date: 08/09/2022  Reason for Consultation/Follow-up: Establishing goals of care  HPI/Brief Hospital Review: 77 y.o. 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 Smith Marc with oncology), HTN, HLD, dementia and gout admitted from [facility] on 08/09/2022 with altered mental status. Reports from facility, found to be febrile and minimally responsive prompting EMS to be called.   Found to have sepsis secondary to aspiration PNA, UTI, required intubation in ED, successfully extubated 6/19   Concern for recurrent aspiration, failed ST evaluation 6/20  Transitioned to comfort care 6/23, remains on comfort feeds-dysphagia 1 with honey thickened liquids   CT head 6/18 IMPRESSION: 1. No evidence of acute intracranial abnormality. 2. Severe chronic microvascular ischemic disease.   Palliative medicine was consulted for assisting with goals of care conversations.  Subjective: Extensive chart review has been completed prior to meeting patient including labs, vital signs, imaging, progress notes, orders, and available advanced directive documents from current and previous encounters.    Visited with Marc Marc at his bedside. Awake and alert, able to share he remains in hospital but unclear to situation. Acknowledges my presence and able to make needs known. No family at bedside during time of visit.  Called and spoke with Marc Smith-nephew, plan and goals remain clear, TOC continues to assist with bed  placement, LTC with hospice following. Answered and addressed all questions and concerns.   Assessment/Recommendation/Plan  DNR/DNI Comfort Care-hospice to follow at discharge, comfort feeds TOC assisting with disposition   Thank you for allowing the Palliative Medicine Team to assist in the care of this patient.  Total time:  25 minutes  Time spent includes: Detailed review of medical records (labs, imaging, vital signs), medically appropriate exam (mental status, respiratory, cardiac, skin), discussed with treatment team, counseling and educating patient, family and staff, documenting clinical information, medication management and coordination of care.  Leeanne Deed, DNP, AGNP-C Palliative Medicine   Please contact Palliative Medicine Team phone at [phone] for questions and concerns.

## 2022-08-20 DIAGNOSIS — J690 Pneumonitis due to inhalation of food and vomit: Secondary | ICD-10-CM | POA: Diagnosis not present

## 2022-08-20 NOTE — Plan of Care (Signed)
  Problem: Education: Goal: Knowledge of General Education information will improve Description: Including pain rating scale, medication(s)/side effects and non-pharmacologic comfort measures Outcome: Progressing   Problem: Health Behavior/Discharge Planning: Goal: Ability to manage health-related needs will improve Outcome: Progressing   Problem: Clinical Measurements: Goal: Ability to maintain clinical measurements within normal limits will improve Outcome: Progressing   Problem: Activity: Goal: Risk for activity intolerance will decrease Outcome: Progressing   Problem: Nutrition: Goal: Adequate nutrition will be maintained Outcome: Progressing   Problem: Coping: Goal: Level of anxiety will decrease Outcome: Progressing   Problem: Pain Managment: Goal: General experience of comfort will improve Outcome: Progressing   Problem: Safety: Goal: Ability to remain free from injury will improve Outcome: Progressing   Problem: Skin Integrity: Goal: Risk for impaired skin integrity will decrease Outcome: Progressing   Problem: Coping: Goal: Ability to identify and develop effective coping behavior will improve Outcome: Progressing

## 2022-08-20 NOTE — Progress Notes (Signed)
   08/20/22 1400  Spiritual Encounters  Type of Visit Initial  Care provided to: Patient  Referral source Nurse (RN/NT/LPN)  Reason for visit Routine spiritual support  OnCall Visit Yes   Chaplain responded to nurse's request to give some time to patient. Patient appear tired but talked a little about the visit of his family earlier in day. He said, "I want to go home." Chaplain remained in room allowing time for conversation.

## 2022-08-20 NOTE — Progress Notes (Signed)
[employer], contracting [facility]) [facility] Liaison Nurse Note   Long term care bed search continues.  Liaison will continue to follow for discharge disposition.    Please do not hesitate to call for any Hospice related questions or concerns.  Thank you for allowing participation in this patient's care.  Norris Cross, RN Nurse Liaision [phone]

## 2022-08-20 NOTE — Progress Notes (Signed)
Daily Progress Note   Patient Name: Marc Marc       Date: 08/20/2022 DOB: June 15, 1945  Age: 77 y.o. MRN#: [id number] Attending Physician: Marc Moore, MD Primary Care Physician: Marc Marc, [facility] Admit Date: 08/09/2022  Reason for Consultation/Follow-up: Establishing goals of care  HPI/Brief Hospital Review: 77 y.o. 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 Smith Marc with oncology), HTN, HLD, dementia and gout admitted from [facility] [place] on 08/09/2022 with altered mental status. Reports from facility, found to be febrile and minimally responsive prompting EMS to be called.   Found to have sepsis secondary to aspiration PNA, UTI, required intubation in ED, successfully extubated 6/19   Concern for recurrent aspiration, failed ST evaluation 6/20   Transitioned to comfort care 6/23, remains on comfort feeds-dysphagia 1 with honey thickened liquids   CT head 6/18 IMPRESSION: 1. No evidence of acute intracranial abnormality. 2. Severe chronic microvascular ischemic disease.   Palliative medicine was consulted for assisting with goals of care conversations.  Subjective: Extensive chart review has been completed prior to meeting patient including labs, vital signs, imaging, progress notes, orders, and available advanced directive documents from current and previous encounters.    Visited with Marc Marc at his bedside. Resting comfortably with eyes closed, does not acknowledge my presence in the room. Nephew-Marc Smith and niece-Marc Smith at bedside during time of visit.  Provided updates related to care, remains on comfort care and comfort feeds, tolerating well, remains high risk for aspiration. Symptom  burden at this time remains low, we discussed this as an ongoing assessment with assistance from hospice providers being able to follow at discharge and manage symptoms as they arise.  Answered questions surrounding discharge planning. Ensured family that discharge would not take place until LTC placement established. TOC continues to work on LTC placement with hospice services following.  Answered and addressed all questions and concerns. PMT to continue to follow for ongoing needs and support.   Thank you for allowing the Palliative Medicine Team to assist in the care of this patient.  Total time:  35 minutes  Time spent includes: Detailed review of medical records (labs, imaging, vital signs), medically appropriate exam (mental status, respiratory, cardiac, skin), discussed with treatment team, counseling and educating patient, family and  staff, documenting clinical information, medication management and coordination of care.  Marc Deed, DNP, AGNP-C Palliative Medicine   Please contact Palliative Medicine Team phone at [phone] for questions and concerns.

## 2022-08-20 NOTE — Progress Notes (Signed)
PROGRESS NOTE    Marc Smith  [id number]:[id number] DOB: [birth year] DOA: 08/09/2022 PCP: Almetta Lovely, [facility]    Brief Narrative:  77 y.o. 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   They presented from nursing home to the ED on 08/09/2022 with AMS x 1 days.   In the ED, it was found that they had sepsis with hypoxia and respiratory distress so intubated and admitted to ICU.  Significant findings included  lactic acid 1.1, BUN/creatinine 41/1.60216, WBC 11.8, platelets 507. Urinalysis positive for infection. CT chest with airspace disease in the left lower lobe, patchy airspace opacities in the right lower lobe, thyroid nodule. CT head non acute.   They were initially treated with intubation, hydrocortisone, and antibiotics for PNA and UTI.   6/19: extubated. Continued on antibiotics for PNA and UTI 6/20- weaned to room air. Barium swallow study completed. SLP evaluated and due to high risk of aspiration, recommended keep NPO. Palliative consulted to discuss GOC.  6/21:stable. Remains NPO. Overnight, he had increased delirium requiring tele-sitter and mittens as well as haldol. He did not sustain injuries.  6/22: stable. No overnight events. Family meeting with palliative today. 6/23: further palliative and hospice discussions with family. Patient states he is hungry. Another episode of hypoglycemia so increased fluid rate. Completed course of azithromycin. Transitioned to comfort care. Able to tolerate comfort feeds.  6/24: clinically stable on room air for discharge to LTC hospice when available. 6/26: Stable, awaiting LTC placement with hospice   Assessment & Plan:   Principal Problem:   Aspiration pneumonia (HCC) Active Problems:   Sepsis with acute hypoxic respiratory failure and septic shock (HCC)   NSTEMI (non-ST elevated myocardial infarction) (HCC)  Full  comfort measures to continue.  Off medications not focused on patient comfort have been discontinued.  Patient is pending transfer to long-term care with hospice services.  Discussed with TOC, may be able to return to [facility] with hospice.  [facility] in [place] evaluating patients medicaid.  Discharge plan pending.   DVT prophylaxis: None Code Status: DNR Family Communication: None Disposition Plan: Status is: Inpatient Remains inpatient appropriate because: Full comfort measures.  Pending transfer to long-term care with hospice services.   Level of care: Palliative Care  Consultants:  Palliative care  Procedures:  None  Antimicrobials: None   Subjective: No acute overnight events  Objective: Vitals:   08/18/22 1543 08/18/22 1957 08/19/22 1629 08/20/22 0745  BP: (!) 115/54 (!) 96/57 (!) 108/59 122/71  Pulse: 95 82 85 84  Resp: 16 17 (!) 24 (!) 28  Temp: 98.6 F (37 C) 98.9 F (37.2 C) 98.5 F (36.9 C) 98.2 F (36.8 C)  TempSrc:  Oral  Oral  SpO2: 97% 95% 98% 97%  Weight:      Height:        Intake/Output Summary (Last 24 hours) at 08/20/2022 1057 Last data filed at 08/20/2022 0617 Gross per 24 hour  Intake 360 ml  Output 750 ml  Net -390 ml   Filed Weights   08/13/22 0500 08/14/22 0425 08/15/22 0452  Weight: 74.8 kg 66 kg 69.1 kg    Examination:  Limited exam due to comfort measure status  General exam: NAD. Frail Respiratory system: Clear to auscultation. Respiratory effort normal. Cardiovascular system: S1-S2, RRR, no murmurs Psychiatry: Judgement and insight appear impaired. Mood & affect flattened.     Data Reviewed: I have personally reviewed following labs and  imaging studies  CBC: No results for input(s): "WBC", "NEUTROABS", "HGB", "HCT", "MCV", "PLT" in the last 168 hours.  Basic Metabolic Panel: No results for input(s): "NA", "K", "CL", "CO2", "GLUCOSE", "BUN", "CREATININE", "CALCIUM", "MG", "PHOS" in the last 168  hours.  GFR: Estimated Creatinine Clearance: 76.8 mL/min (by C-G formula based on SCr of 0.64 mg/dL). Liver Function Tests: No results for input(s): "AST", "ALT", "ALKPHOS", "BILITOT", "PROT", "ALBUMIN" in the last 168 hours. No results for input(s): "LIPASE", "AMYLASE" in the last 168 hours. No results for input(s): "AMMONIA" in the last 168 hours. Coagulation Profile: No results for input(s): "INR", "PROTIME" in the last 168 hours. Cardiac Enzymes: No results for input(s): "CKTOTAL", "CKMB", "CKMBINDEX", "TROPONINI" in the last 168 hours. BNP (last 3 results) No results for input(s): "PROBNP" in the last 8760 hours. HbA1C: No results for input(s): "HGBA1C" in the last 72 hours. CBG: Recent Labs  Lab 08/14/22 0447 08/14/22 0720 08/14/22 1124 08/14/22 1232 08/14/22 1626  GLUCAP 98 99 64* 159* 90   Lipid Profile: No results for input(s): "CHOL", "HDL", "LDLCALC", "TRIG", "CHOLHDL", "LDLDIRECT" in the last 72 hours. Thyroid Function Tests: No results for input(s): "TSH", "T4TOTAL", "FREET4", "T3FREE", "THYROIDAB" in the last 72 hours. Anemia Panel: No results for input(s): "VITAMINB12", "FOLATE", "FERRITIN", "TIBC", "IRON", "RETICCTPCT" in the last 72 hours. Sepsis Labs: No results for input(s): "PROCALCITON", "LATICACIDVEN" in the last 168 hours.  No results found for this or any previous visit (from the past 240 hour(s)).        Radiology Studies: No results found.      Scheduled Meds:  nicotine  7 mg Transdermal Daily   Continuous Infusions:  sodium chloride 10 mL/hr at 08/16/22 0721     LOS: 11 days       Tresa Moore, MD [facility]   If 7PM-7AM, please contact night-coverage  08/20/2022, 10:57 AM

## 2022-08-21 DIAGNOSIS — J690 Pneumonitis due to inhalation of food and vomit: Secondary | ICD-10-CM | POA: Diagnosis not present

## 2022-08-21 NOTE — Progress Notes (Signed)
                                                                                                                                                                                                           Daily Progress Note   Patient Name: Marc Smith       Date: 08/21/2022 DOB: 09/08/1945  Age: 77 y.o. MRN#: [id number] Attending Physician: Tresa Moore, MD Primary Care Physician: Almetta Lovely, [facility] Admit Date: 08/09/2022  Reason for Consultation/Follow-up: Establishing goals of care  HPI/Brief Hospital Review: 77 y.o. 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 Smith [name] with oncology), HTN, HLD, dementia and gout admitted from [facility] on 08/09/2022 with altered mental status. Reports from facility, found to be febrile and minimally responsive prompting EMS to be called.   Found to have sepsis secondary to aspiration PNA, UTI, required intubation in ED, successfully extubated 6/19   Concern for recurrent aspiration, failed ST evaluation 6/20   Transitioned to comfort care 6/23, remains on comfort feeds-dysphagia 1 with honey thickened liquids   CT head 6/18 IMPRESSION: 1. No evidence of acute intracranial abnormality. 2. Severe chronic microvascular ischemic disease.   Palliative medicine was consulted for assisting with goals of care conversations.  Subjective: Extensive chart review has been completed prior to meeting patient including labs, vital signs, imaging, progress notes, orders, and available advanced directive documents from current and previous encounters.    Visited with Marc Smith at his bedside. Awake and alert, requesting water, assisted with sips of thickened water. Denies acute pain or discomfort. No family at bedside during time of visit.  TOC continues to assist with LTC bed placement. Hospice services to follow at discharge, [facility] following until LTC bed  established.  Low symptom burden. PMT to continue to follow for ongoing needs and support.  Thank you for allowing the Palliative Medicine Team to assist in the care of this patient.  Total time:  25 minutes  Time spent includes: Detailed review of medical records (labs, imaging, vital signs), medically appropriate exam (mental status, respiratory, cardiac, skin), discussed with treatment team, counseling and educating patient, family and staff, documenting clinical information, medication management and coordination of care.  Leeanne Deed, DNP, AGNP-C Palliative Medicine   Please contact Palliative Medicine Team phone at [phone] for questions and concerns.

## 2022-08-21 NOTE — Progress Notes (Signed)
PROGRESS NOTE    Marc Smith  ZOX:[id number] DOB: 03/29/1945 DOA: 08/09/2022 PCP: Almetta Lovely, [facility]    Brief Narrative:  77 y.o. 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   They presented from nursing home to the ED on 08/09/2022 with AMS x 1 days.   In the ED, it was found that they had sepsis with hypoxia and respiratory distress so intubated and admitted to ICU.  Significant findings included  lactic acid 1.1, BUN/creatinine 41/1.60216, WBC 11.8, platelets 507. Urinalysis positive for infection. CT chest with airspace disease in the left lower lobe, patchy airspace opacities in the right lower lobe, thyroid nodule. CT head non acute.   They were initially treated with intubation, hydrocortisone, and antibiotics for PNA and UTI.   6/19: extubated. Continued on antibiotics for PNA and UTI 6/20- weaned to room air. Barium swallow study completed. SLP evaluated and due to high risk of aspiration, recommended keep NPO. Palliative consulted to discuss GOC.  6/21:stable. Remains NPO. Overnight, he had increased delirium requiring tele-sitter and mittens as well as haldol. He did not sustain injuries.  6/22: stable. No overnight events. Family meeting with palliative today. 6/23: further palliative and hospice discussions with family. Patient states he is hungry. Another episode of hypoglycemia so increased fluid rate. Completed course of azithromycin. Transitioned to comfort care. Able to tolerate comfort feeds.  6/24: clinically stable on room air for discharge to LTC hospice when available. 6/26: Stable, awaiting LTC placement with hospice   Assessment & Plan:   Principal Problem:   Aspiration pneumonia (HCC) Active Problems:   Sepsis with acute hypoxic respiratory failure and septic shock (HCC)   NSTEMI (non-ST elevated myocardial infarction) (HCC)  Full  comfort measures to continue.  Off medications not focused on patient comfort have been discontinued.  Patient is pending transfer to long-term care with hospice services.  Discussed with TOC, may be able to return to [facility] with hospice.  [facility] in [place] evaluating patients medicaid.  Discharge plan pending.   DVT prophylaxis: None Code Status: DNR Family Communication: None Disposition Plan: Status is: Inpatient Remains inpatient appropriate because: Full comfort measures.  Pending transfer to long-term care with hospice services.   Level of care: Palliative Care  Consultants:  Palliative care  Procedures:  None  Antimicrobials: None   Subjective: No acute events overnight  Objective: Vitals:   08/18/22 1543 08/18/22 1957 08/19/22 1629 08/20/22 0745  BP: (!) 115/54 (!) 96/57 (!) 108/59 122/71  Pulse: 95 82 85 84  Resp: 16 17 (!) 24 (!) 28  Temp: 98.6 F (37 C) 98.9 F (37.2 C) 98.5 F (36.9 C) 98.2 F (36.8 C)  TempSrc:  Oral  Oral  SpO2: 97% 95% 98% 97%  Weight:      Height:        Intake/Output Summary (Last 24 hours) at 08/21/2022 0903 Last data filed at 08/21/2022 8119 Gross per 24 hour  Intake --  Output 875 ml  Net -875 ml   Filed Weights   08/13/22 0500 08/14/22 0425 08/15/22 0452  Weight: 74.8 kg 66 kg 69.1 kg    Examination:  Limited exam due to comfort measure status  General exam: NAD. Appears frail Respiratory system: Clear to auscultation. Respiratory effort normal. Cardiovascular system: S1-S2, RRR, no murmurs Psychiatry: Judgement and insight appear impaired. Mood & affect flattened.     Data Reviewed: I have personally reviewed following labs and  imaging studies  CBC: No results for input(s): "WBC", "NEUTROABS", "HGB", "HCT", "MCV", "PLT" in the last 168 hours.  Basic Metabolic Panel: No results for input(s): "NA", "K", "CL", "CO2", "GLUCOSE", "BUN", "CREATININE", "CALCIUM", "MG", "PHOS" in the last 168  hours.  GFR: Estimated Creatinine Clearance: 76.8 mL/min (by C-G formula based on SCr of 0.64 mg/dL). Liver Function Tests: No results for input(s): "AST", "ALT", "ALKPHOS", "BILITOT", "PROT", "ALBUMIN" in the last 168 hours. No results for input(s): "LIPASE", "AMYLASE" in the last 168 hours. No results for input(s): "AMMONIA" in the last 168 hours. Coagulation Profile: No results for input(s): "INR", "PROTIME" in the last 168 hours. Cardiac Enzymes: No results for input(s): "CKTOTAL", "CKMB", "CKMBINDEX", "TROPONINI" in the last 168 hours. BNP (last 3 results) No results for input(s): "PROBNP" in the last 8760 hours. HbA1C: No results for input(s): "HGBA1C" in the last 72 hours. CBG: Recent Labs  Lab 08/14/22 1124 08/14/22 1232 08/14/22 1626  GLUCAP 64* 159* 90   Lipid Profile: No results for input(s): "CHOL", "HDL", "LDLCALC", "TRIG", "CHOLHDL", "LDLDIRECT" in the last 72 hours. Thyroid Function Tests: No results for input(s): "TSH", "T4TOTAL", "FREET4", "T3FREE", "THYROIDAB" in the last 72 hours. Anemia Panel: No results for input(s): "VITAMINB12", "FOLATE", "FERRITIN", "TIBC", "IRON", "RETICCTPCT" in the last 72 hours. Sepsis Labs: No results for input(s): "PROCALCITON", "LATICACIDVEN" in the last 168 hours.  No results found for this or any previous visit (from the past 240 hour(s)).        Radiology Studies: No results found.      Scheduled Meds:  nicotine  7 mg Transdermal Daily   Continuous Infusions:  sodium chloride 10 mL/hr at 08/16/22 1610     LOS: 12 days       Tresa Moore, MD [facility]   If 7PM-7AM, please contact night-coverage  08/21/2022, 9:03 AM

## 2022-08-21 NOTE — Progress Notes (Signed)
[facility] [employer], contracting [facility]) [facility] Liaison Nurse Note   Long term care bed search continues.  Liaison will continue to follow for discharge disposition.    Please do not hesitate to call for any Hospice related questions or concerns.   Thank you for allowing participation in this patient's care.   Norris Cross, RN Nurse Liaision [phone]

## 2022-08-21 NOTE — Progress Notes (Signed)
Patient hold his foley and pulled it mostly out.Took old one out and try to get new  foley's on him but he was bleeding and not able to get new one.MD made aware. Bladder scan done and it shows 14 ml.dressing done with gauze. No any other active intervention .

## 2022-08-21 NOTE — Plan of Care (Signed)
  Problem: Health Behavior/Discharge Planning: Goal: Ability to manage health-related needs will improve Outcome: Progressing   Problem: Activity: Goal: Risk for activity intolerance will decrease Outcome: Progressing   Problem: Nutrition: Goal: Adequate nutrition will be maintained Outcome: Progressing   Problem: Coping: Goal: Level of anxiety will decrease Outcome: Progressing   Problem: Elimination: Goal: Will not experience complications related to bowel motility Outcome: Progressing   Problem: Pain Managment: Goal: General experience of comfort will improve Outcome: Progressing   

## 2022-08-22 ENCOUNTER — Inpatient Hospital Stay: Payer: 59 | Attending: Oncology

## 2022-08-22 ENCOUNTER — Other Ambulatory Visit: Payer: 59

## 2022-08-22 NOTE — Progress Notes (Signed)
PROGRESS NOTE    Marc Smith  ZOX:[id number] DOB: Dec 03, 1945 DOA: 08/09/2022 PCP: Almetta Lovely, [facility]    Brief Narrative:  77 y.o. 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   They presented from nursing home to the ED on 08/09/2022 with AMS x 1 days.   In the ED, it was found that they had sepsis with hypoxia and respiratory distress so intubated and admitted to ICU.  Significant findings included  lactic acid 1.1, BUN/creatinine 41/1.60216, WBC 11.8, platelets 507. Urinalysis positive for infection. CT chest with airspace disease in the left lower lobe, patchy airspace opacities in the right lower lobe, thyroid nodule. CT head non acute.   They were initially treated with intubation, hydrocortisone, and antibiotics for PNA and UTI.   6/19: extubated. Continued on antibiotics for PNA and UTI 6/20- weaned to room air. Barium swallow study completed. SLP evaluated and due to high risk of aspiration, recommended keep NPO. Palliative consulted to discuss GOC.  6/21:stable. Remains NPO. Overnight, he had increased delirium requiring tele-sitter and mittens as well as haldol. He did not sustain injuries.  6/22: stable. No overnight events. Family meeting with palliative today. 6/23: further palliative and hospice discussions with family. Patient states he is hungry. Another episode of hypoglycemia so increased fluid rate. Completed course of azithromycin. Transitioned to comfort care. Able to tolerate comfort feeds.  6/24: clinically stable on room air for discharge to LTC hospice when available. 6/26: Stable, awaiting LTC placement with hospice 7/1: Patient self discontinued Foley catheter yesterday.  Some blood noted from urethral meatus.  No signs of urinary retention.  Foley not replaced.   Assessment & Plan:   Principal Problem:   Aspiration pneumonia (HCC) Active  Problems:   Sepsis with acute hypoxic respiratory failure and septic shock (HCC)   NSTEMI (non-ST elevated myocardial infarction) (HCC)  Full comfort measures to continue.  Off medications not focused on patient comfort have been discontinued.  Patient is pending transfer to long-term care with hospice services.  Discussed with TOC, may be able to return to [facility] [place] with hospice.  [facility] [place] evaluating patients medicaid.  Discharge plan pending.  Frequent communication with TOC   DVT prophylaxis: None Code Status: DNR Family Communication: None Disposition Plan: Status is: Inpatient Remains inpatient appropriate because: Full comfort measures.  Pending transfer to long-term care with hospice services.   Level of care: Palliative Care  Consultants:  Palliative care  Procedures:  None  Antimicrobials: None   Subjective: Appears comfortable  Objective: Vitals:   08/18/22 1957 08/19/22 1629 08/20/22 0745 08/21/22 1850  BP: (!) 96/57 (!) 108/59 122/71 117/66  Pulse: 82 85 84 96  Resp: 17 (!) 24 (!) 28 18  Temp: 98.9 F (37.2 C) 98.5 F (36.9 C) 98.2 F (36.8 C) 98.8 F (37.1 C)  TempSrc: Oral  Oral   SpO2: 95% 98% 97% 100%  Weight:      Height:        Intake/Output Summary (Last 24 hours) at 08/22/2022 1049 Last data filed at 08/21/2022 1916 Gross per 24 hour  Intake 240 ml  Output 200 ml  Net 40 ml   Filed Weights   08/13/22 0500 08/14/22 0425 08/15/22 0452  Weight: 74.8 kg 66 kg 69.1 kg    Examination:  Limited exam due to comfort measure status  General exam: NAD.  Frail-appearing fatigued Respiratory system: Clear to auscultation. Respiratory effort normal. Cardiovascular  system: S1-S2, RRR, no murmurs Psychiatry: Judgement and insight appear impaired. Mood & affect flattened.     Data Reviewed: I have personally reviewed following labs and imaging studies  CBC: No results for input(s): "WBC", "NEUTROABS", "HGB", "HCT", "MCV",  "PLT" in the last 168 hours.  Basic Metabolic Panel: No results for input(s): "NA", "K", "CL", "CO2", "GLUCOSE", "BUN", "CREATININE", "CALCIUM", "MG", "PHOS" in the last 168 hours.  GFR: Estimated Creatinine Clearance: 76.8 mL/min (by C-G formula based on SCr of 0.64 mg/dL). Liver Function Tests: No results for input(s): "AST", "ALT", "ALKPHOS", "BILITOT", "PROT", "ALBUMIN" in the last 168 hours. No results for input(s): "LIPASE", "AMYLASE" in the last 168 hours. No results for input(s): "AMMONIA" in the last 168 hours. Coagulation Profile: No results for input(s): "INR", "PROTIME" in the last 168 hours. Cardiac Enzymes: No results for input(s): "CKTOTAL", "CKMB", "CKMBINDEX", "TROPONINI" in the last 168 hours. BNP (last 3 results) No results for input(s): "PROBNP" in the last 8760 hours. HbA1C: No results for input(s): "HGBA1C" in the last 72 hours. CBG: No results for input(s): "GLUCAP" in the last 168 hours.  Lipid Profile: No results for input(s): "CHOL", "HDL", "LDLCALC", "TRIG", "CHOLHDL", "LDLDIRECT" in the last 72 hours. Thyroid Function Tests: No results for input(s): "TSH", "T4TOTAL", "FREET4", "T3FREE", "THYROIDAB" in the last 72 hours. Anemia Panel: No results for input(s): "VITAMINB12", "FOLATE", "FERRITIN", "TIBC", "IRON", "RETICCTPCT" in the last 72 hours. Sepsis Labs: No results for input(s): "PROCALCITON", "LATICACIDVEN" in the last 168 hours.  No results found for this or any previous visit (from the past 240 hour(s)).        Radiology Studies: No results found.      Scheduled Meds:  nicotine  7 mg Transdermal Daily   Continuous Infusions:  sodium chloride 10 mL/hr at 08/16/22 0721     LOS: 13 days       Tresa Moore, MD [facility]   If 7PM-7AM, please contact night-coverage  08/22/2022, 10:49 AM

## 2022-08-22 NOTE — Care Management Important Message (Signed)
Important Message  Patient Details  Name: Marc Smith MRN: [id number] Date of Birth: 1945/12/14   Medicare Important Message Given:  Other (see comment)  Patient is on comfort care with plans to discharge with hospice. Out of respect for the patient and family no Important Message from [facility] given.   Olegario Messier A Reah Justo 08/22/2022, 8:12 AM

## 2022-08-22 NOTE — Progress Notes (Signed)
Daily Progress Note   Patient Name: Marc Smith       Date: 08/22/2022 DOB: 1945-10-11  Age: 77 y.o. MRN#: [id number] Attending Physician: Tresa Moore, MD Primary Care Physician: Almetta Lovely, [facility] Admit Date: 08/09/2022  Reason for Consultation/Follow-up: Establishing goals of care and Terminal Care  Subjective: Notes reviewed.  Patient discharging to facility with hospice to follow.  In to see patient.  No family at bedside.  He is currently resting in bed at this time with eyes closed.  He awakens to voice.  He denies complaint at this time.  He appears comfortable.  No changes to comfort regimen recommended at this time.  Length of Stay: 13  Current Medications: Scheduled Meds:   nicotine  7 mg Transdermal Daily    Continuous Infusions:  sodium chloride 10 mL/hr at 08/16/22 0721    PRN Meds: acetaminophen **OR** acetaminophen, antiseptic oral rinse, glycopyrrolate **OR** glycopyrrolate **OR** glycopyrrolate, haloperidol **OR** haloperidol **OR** haloperidol lactate, LORazepam **OR** LORazepam **OR** LORazepam, morphine CONCENTRATE **OR** morphine CONCENTRATE, ondansetron **OR** ondansetron (ZOFRAN) IV, mouth rinse, polyethylene glycol, polyvinyl alcohol  Physical Exam Pulmonary:     Effort: Pulmonary effort is normal.  Neurological:     Mental Status: He is alert.             Vital Signs: BP 117/66 (BP Location: Right Arm)   Pulse 96   Temp 98.8 F (37.1 C)   Resp 18   Ht 6' (1.829 m)   Wt 69.1 kg   SpO2 100%   BMI 20.66 kg/m  SpO2: SpO2: 100 % O2 Device: O2 Device: Room Air O2 Flow Rate: O2 Flow Rate (L/min): 4 L/min  Intake/output summary:  Intake/Output Summary (Last 24 hours) at 08/22/2022 1152 Last data filed at 08/21/2022 1916 Gross per 24  hour  Intake 120 ml  Output 200 ml  Net -80 ml   LBM: Last BM Date : 08/20/22 Baseline Weight: Weight: 74.4 kg Most recent weight: Weight: 69.1 kg         Patient Active Problem List   Diagnosis Date Noted   Sepsis with acute hypoxic respiratory failure and septic shock (HCC) 08/11/2022   NSTEMI (non-ST elevated myocardial infarction) (HCC) 08/11/2022   Stool incontinence 05/12/2019   Urinary incontinence 05/12/2019   Age-related physical debility 05/12/2019   Hyperlipidemia  05/12/2019   Acute pain of right lower extremity 05/08/2019   Tachycardia 05/07/2019   Aspiration pneumonia (HCC)    Elevated troponin I level    Thrombocytosis    Leukocytosis    Community acquired pneumonia    UTI (urinary tract infection) 11/20/2016   Acute lower UTI 11/20/2016   Gout attack 11/20/2016   Essential hypertension 11/20/2016    Palliative Care Assessment & Plan    Recommendations/Plan: Patient discharging to facility with hospice to follow there. Patient appears comfortable, no changes to comfort care regimen recommended at this time.   Code Status:    Code Status Orders  (From admission, onward)           Start     Ordered   08/14/22 1641  Do not attempt resuscitation (DNR)  Continuous       Question Answer Comment  If patient has no pulse and is not breathing Do Not Attempt Resuscitation   If patient has a pulse and/or is breathing: Medical Treatment Goals COMFORT MEASURES: Keep clean/warm/dry, use medication by any route; positioning, wound care and other measures to relieve pain/suffering; use oxygen, suction/manual treatment of airway obstruction for comfort; do not transfer unless for comfort needs.   Consent: Discussion documented in EHR or advanced directives reviewed      08/14/22 1640           Code Status History     Date Active Date Inactive Code Status Order ID Comments User Context   08/14/2022 1640 08/14/2022 1640 DNR [id number]  Theotis Burrow, NP  Inpatient   08/14/2022 1640 08/14/2022 1640 DNR [id number]  Theotis Burrow, NP Inpatient   08/10/2022 1003 08/14/2022 1640 DNR [id number]  Chilton Greathouse, MD Inpatient   08/10/2022 0930 08/10/2022 1003 DNR [id number]  Chilton Greathouse, MD Inpatient   08/09/2022 1044 08/10/2022 0930 DNR [id number]  Chilton Greathouse, MD ED   08/09/2022 1006 08/09/2022 1043 Full Code [id number]  Chilton Greathouse, MD ED   05/07/2019 1917 05/14/2019 2239 Full Code [id number]  Zigmund Daniel., MD ED   11/20/2016 2343 11/23/2016 2206 Full Code [id number]  Gery Pray, MD Inpatient       Prognosis: Poor   Thank you for allowing the Palliative Medicine Team to assist in the care of this patient.    Morton Stall, NP  Please contact Palliative Medicine Team phone at [phone] for questions and concerns.

## 2022-08-22 NOTE — TOC Progression Note (Signed)
Transition of Care [facility]) - Progression Note    Patient Details  Name: Marc Smith MRN: [id number] Date of Birth: 1946/01/11  Transition of Care [facility]) CM/SW Contact  Allena Katz, LCSW Phone Number: 08/22/2022, 2:46 PM  Clinical Narrative:   Two messages left with Georgeann Oppenheim and Velna Hatchet at [facility], no responses. Nephew informed patient ready for discharge and only open bed is at [facility]. He declines stating he would not accept this offer. TOC supervisor notified.     Expected Discharge Plan:  (TBD) Barriers to Discharge: Continued Medical Work up  Expected Discharge Plan and Services     Post Acute Care Choice:  (TBD) Living arrangements for the past 2 months: Assisted Living Facility                                       Social Determinants of Health (SDOH) Interventions SDOH Screenings   Food Insecurity: No Food Insecurity (07/22/2022)  Housing: Patient Unable To Answer (07/22/2022)  Transportation Needs: No Transportation Needs (07/22/2022)  Utilities: Not At Risk (07/22/2022)  Depression (PHQ2-9): Low Risk  (07/22/2022)  Tobacco Use: High Risk (08/09/2022)    Readmission Risk Interventions     No data to display

## 2022-08-22 NOTE — Progress Notes (Signed)
Patient continue to have pink ting urine from prior trauma from foley incident yesterday, does not apper to be in any type of discomfort.

## 2022-08-22 NOTE — Progress Notes (Signed)
[facility] [employer] [facility]) [facility] Liaison Nurse Note   Long term care bed search continues.  Liaison will continue to follow for discharge disposition.    Please do not hesitate to call for any Hospice related questions or concerns.   Thank you for allowing participation in this patient's care.  [facility] Liaison [phone]

## 2022-08-22 NOTE — TOC Progression Note (Signed)
Transition of Care [facility]) - Progression Note    Patient Details  Name: Marc Smith MRN: [id number] Date of Birth: 1945-09-01  Transition of Care [facility]) CM/SW Contact  Allena Katz, LCSW Phone Number: 08/22/2022, 3:14 PM  Clinical Narrative:   CSW spoke with Velna Hatchet at [facility] who is going to follow up with her business office on if she is able to take him.     Expected Discharge Plan:  (TBD) Barriers to Discharge: Continued Medical Work up  Expected Discharge Plan and Services     Post Acute Care Choice:  (TBD) Living arrangements for the past 2 months: Assisted Living Facility                                       Social Determinants of Health (SDOH) Interventions SDOH Screenings   Food Insecurity: No Food Insecurity (07/22/2022)  Housing: Patient Unable To Answer (07/22/2022)  Transportation Needs: No Transportation Needs (07/22/2022)  Utilities: Not At Risk (07/22/2022)  Depression (PHQ2-9): Low Risk  (07/22/2022)  Tobacco Use: High Risk (08/09/2022)    Readmission Risk Interventions     No data to display

## 2022-08-23 NOTE — TOC Progression Note (Signed)
Transition of Care [facility]) - Progression Note    Patient Details  Name: Marc Smith MRN: [id number] Date of Birth: 1946-01-08  Transition of Care [facility]) CM/SW Contact  Allena Katz, LCSW Phone Number: 08/23/2022, 1:46 PM  Clinical Narrative:   [facility] no longer able to offer a bed. [facility] in [place] still hasn't responded. Christine with [facility] and [facility] looking to see if she can offer for LTC with hospice.     Expected Discharge Plan:  (TBD) Barriers to Discharge: Continued Medical Work up  Expected Discharge Plan and Services     Post Acute Care Choice:  (TBD) Living arrangements for the past 2 months: Assisted Living Facility                                       Social Determinants of Health (SDOH) Interventions SDOH Screenings   Food Insecurity: No Food Insecurity (07/22/2022)  Housing: Patient Unable To Answer (07/22/2022)  Transportation Needs: No Transportation Needs (07/22/2022)  Utilities: Not At Risk (07/22/2022)  Depression (PHQ2-9): Low Risk  (07/22/2022)  Tobacco Use: High Risk (08/09/2022)    Readmission Risk Interventions     No data to display

## 2022-08-23 NOTE — Progress Notes (Addendum)
[facility]) [facility] Liaison Nurse Note   Patient has been offered a bed at [facility].  Discharge is planned for tomorrow, according to the TOC.  [facility] referrals team notified.     Please do not hesitate to call for any Hospice related questions or concerns.   Thank you for allowing participation in this patient's care.   [facility] Liaison [phone]

## 2022-08-23 NOTE — Progress Notes (Signed)
PROGRESS NOTE    Marc Smith  ZOX:[id number] DOB: 03/01/1945 DOA: 08/09/2022 PCP: Almetta Lovely, [facility]    Brief Narrative:  77 y.o. 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   They presented from nursing home to the ED on 08/09/2022 with AMS x 1 days.   In the ED, it was found that they had sepsis with hypoxia and respiratory distress so intubated and admitted to ICU.  Significant findings included  lactic acid 1.1, BUN/creatinine 41/1.60216, WBC 11.8, platelets 507. Urinalysis positive for infection. CT chest with airspace disease in the left lower lobe, patchy airspace opacities in the right lower lobe, thyroid nodule. CT head non acute.   They were initially treated with intubation, hydrocortisone, and antibiotics for PNA and UTI.   6/19: extubated. Continued on antibiotics for PNA and UTI 6/20- weaned to room air. Barium swallow study completed. SLP evaluated and due to high risk of aspiration, recommended keep NPO. Palliative consulted to discuss GOC.  6/21:stable. Remains NPO. Overnight, he had increased delirium requiring tele-sitter and mittens as well as haldol. He did not sustain injuries.  6/22: stable. No overnight events. Family meeting with palliative today. 6/23: further palliative and hospice discussions with family. Patient states he is hungry. Another episode of hypoglycemia so increased fluid rate. Completed course of azithromycin. Transitioned to comfort care. Able to tolerate comfort feeds.  6/24: clinically stable on room air for discharge to LTC hospice when available. 6/26: Stable, awaiting LTC placement with hospice 7/1: Patient self discontinued Foley catheter yesterday.  Some blood noted from urethral meatus.  No signs of urinary retention.  Foley not replaced.   Assessment & Plan:   Principal Problem:   Aspiration pneumonia (HCC) Active  Problems:   Sepsis with acute hypoxic respiratory failure and septic shock (HCC)   NSTEMI (non-ST elevated myocardial infarction) (HCC)  Full comfort measures to continue.  Off medications not focused on patient comfort have been discontinued.  Patient is pending transfer to long-term care with hospice services.  Discussed with TOC, may be able to return to [facility] [place] with hospice.  [facility] in [place] evaluating patients medicaid.  Discharge plan pending.  Frequent communication with TOC.   DVT prophylaxis: None Code Status: DNR Family Communication: None Disposition Plan: Status is: Inpatient Remains inpatient appropriate because: Full comfort measures.  Pending transfer to long-term care with hospice services.   Level of care: Palliative Care  Consultants:  Palliative care  Procedures:  None  Antimicrobials: None   Subjective: No acute overnight events  Objective: Vitals:   08/20/22 0745 08/21/22 1850 08/22/22 1634 08/23/22 0731  BP: 122/71 117/66 135/63 116/78  Pulse: 84 96 94 85  Resp: (!) 28 18 16 18   Temp: 98.2 F (36.8 C) 98.8 F (37.1 C) 99.1 F (37.3 C) 98 F (36.7 C)  TempSrc: Oral     SpO2: 97% 100% 100% 100%  Weight:      Height:        Intake/Output Summary (Last 24 hours) at 08/23/2022 1116 Last data filed at 08/23/2022 1100 Gross per 24 hour  Intake 300 ml  Output 1250 ml  Net -950 ml   Filed Weights   08/13/22 0500 08/14/22 0425 08/15/22 0452  Weight: 74.8 kg 66 kg 69.1 kg    Examination:  Limited exam due to comfort measure status  General exam: NAD.  Frail-appearing fatigued Respiratory system: Clear to auscultation. Respiratory effort normal. Cardiovascular system:  S1-S2, RRR, no murmurs Psychiatry: Judgement and insight appear impaired. Mood & affect flattened.     Data Reviewed: I have personally reviewed following labs and imaging studies  CBC: No results for input(s): "WBC", "NEUTROABS", "HGB", "HCT", "MCV", "PLT"  in the last 168 hours.  Basic Metabolic Panel: No results for input(s): "NA", "K", "CL", "CO2", "GLUCOSE", "BUN", "CREATININE", "CALCIUM", "MG", "PHOS" in the last 168 hours.  GFR: Estimated Creatinine Clearance: 76.8 mL/min (by C-G formula based on SCr of 0.64 mg/dL). Liver Function Tests: No results for input(s): "AST", "ALT", "ALKPHOS", "BILITOT", "PROT", "ALBUMIN" in the last 168 hours. No results for input(s): "LIPASE", "AMYLASE" in the last 168 hours. No results for input(s): "AMMONIA" in the last 168 hours. Coagulation Profile: No results for input(s): "INR", "PROTIME" in the last 168 hours. Cardiac Enzymes: No results for input(s): "CKTOTAL", "CKMB", "CKMBINDEX", "TROPONINI" in the last 168 hours. BNP (last 3 results) No results for input(s): "PROBNP" in the last 8760 hours. HbA1C: No results for input(s): "HGBA1C" in the last 72 hours. CBG: No results for input(s): "GLUCAP" in the last 168 hours.  Lipid Profile: No results for input(s): "CHOL", "HDL", "LDLCALC", "TRIG", "CHOLHDL", "LDLDIRECT" in the last 72 hours. Thyroid Function Tests: No results for input(s): "TSH", "T4TOTAL", "FREET4", "T3FREE", "THYROIDAB" in the last 72 hours. Anemia Panel: No results for input(s): "VITAMINB12", "FOLATE", "FERRITIN", "TIBC", "IRON", "RETICCTPCT" in the last 72 hours. Sepsis Labs: No results for input(s): "PROCALCITON", "LATICACIDVEN" in the last 168 hours.  No results found for this or any previous visit (from the past 240 hour(s)).        Radiology Studies: No results found.      Scheduled Meds:  nicotine  7 mg Transdermal Daily   Continuous Infusions:  sodium chloride 10 mL/hr at 08/16/22 0721     LOS: 14 days       Tresa Moore, MD [facility]   If 7PM-7AM, please contact night-coverage  08/23/2022, 11:16 AM

## 2022-08-23 NOTE — TOC Progression Note (Signed)
Transition of Care [facility]) - Progression Note    Patient Details  Name: Marc Smith MRN: [id number] Date of Birth: 12-28-1945  Transition of Care [facility]) CM/SW Contact  Allena Katz, LCSW Phone Number: 08/23/2022, 3:38 PM  Clinical Narrative:   Wynona Canes with [facility] can accept pt for LTC with hospice pending an LOG. Discussed with [facility] supervisor Windell Moulding. LOG approved and sent. Nephew agreeable to plan. Patient to discharge tomorrow.     Expected Discharge Plan:  (TBD) Barriers to Discharge: Continued Medical Work up  Expected Discharge Plan and Services     Post Acute Care Choice:  (TBD) Living arrangements for the past 2 months: Assisted Living Facility                                       Social Determinants of Health (SDOH) Interventions SDOH Screenings   Food Insecurity: No Food Insecurity (07/22/2022)  Housing: Patient Unable To Answer (07/22/2022)  Transportation Needs: No Transportation Needs (07/22/2022)  Utilities: Not At Risk (07/22/2022)  Depression (PHQ2-9): Low Risk  (07/22/2022)  Tobacco Use: High Risk (08/09/2022)    Readmission Risk Interventions     No data to display

## 2022-08-23 NOTE — Progress Notes (Signed)
Nutrition Brief Note  Chart reviewed. Pt remains on comfort measures and is awaiting placement at LTC.   No further nutrition interventions planned at this time.  Please re-consult as needed.   Levada Schilling, RD, LDN, CDCES Registered Dietitian II Certified Diabetes Care and Education Specialist Please refer to [facility] for RD and/or RD on-call/weekend/after hours pager

## 2022-08-23 NOTE — Plan of Care (Signed)

## 2022-08-23 NOTE — Progress Notes (Signed)
Daily Progress Note   Patient Name: Marc Smith       Date: 08/23/2022 DOB: 26-Feb-1945  Age: 77 y.o. MRN#: [id number] Attending Physician: Tresa Moore, MD Primary Care Physician: Almetta Lovely, [facility] Admit Date: 08/09/2022  Reason for Consultation/Follow-up: Establishing goals of care, End-of-life care  Subjective: Notes reviewed.  Into see patient.  He is currently sitting in bedside chair eating his lunch, with staff member at bedside. He looks up at me when I enter the room, but does not speak during my visit. He appears to have no difficulty with eating and drinking.  No distress noted.  Waiting for long-term care placement with hospice to follow.  Length of Stay: 14  Current Medications: Scheduled Meds:   nicotine  7 mg Transdermal Daily    Continuous Infusions:  sodium chloride 10 mL/hr at 08/16/22 0721    PRN Meds: acetaminophen **OR** acetaminophen, antiseptic oral rinse, glycopyrrolate **OR** glycopyrrolate **OR** glycopyrrolate, haloperidol **OR** haloperidol **OR** haloperidol lactate, LORazepam **OR** LORazepam **OR** LORazepam, morphine CONCENTRATE **OR** morphine CONCENTRATE, ondansetron **OR** ondansetron (ZOFRAN) IV, mouth rinse, polyethylene glycol, polyvinyl alcohol  Physical Exam Pulmonary:     Effort: Pulmonary effort is normal.  Neurological:     Mental Status: He is alert.             Vital Signs: BP 116/78 (BP Location: Right Arm)   Pulse 85   Temp 98 F (36.7 C)   Resp 18   Ht 6' (1.829 m)   Wt 69.1 kg   SpO2 100%   BMI 20.66 kg/m  SpO2: SpO2: 100 % O2 Device: O2 Device: Room Air O2 Flow Rate: O2 Flow Rate (L/min): 4 L/min  Intake/output summary:  Intake/Output Summary (Last 24 hours) at 08/23/2022 1426 Last data filed at 08/23/2022  1331 Gross per 24 hour  Intake 600 ml  Output 1450 ml  Net -850 ml   LBM: Last BM Date : 08/23/22 Baseline Weight: Weight: 74.4 kg Most recent weight: Weight: 69.1 kg        Patient Active Problem List   Diagnosis Date Noted   Sepsis with acute hypoxic respiratory failure and septic shock (HCC) 08/11/2022   NSTEMI (non-ST elevated myocardial infarction) (HCC) 08/11/2022   Stool incontinence 05/12/2019   Urinary incontinence 05/12/2019   Age-related physical debility 05/12/2019  Hyperlipidemia 05/12/2019   Acute pain of right lower extremity 05/08/2019   Tachycardia 05/07/2019   Aspiration pneumonia (HCC)    Elevated troponin I level    Thrombocytosis    Leukocytosis    Community acquired pneumonia    UTI (urinary tract infection) 11/20/2016   Acute lower UTI 11/20/2016   Gout attack 11/20/2016   Essential hypertension 11/20/2016    Palliative Care Assessment & Plan     Recommendations/Plan: Patient waiting for long-term care placement with hospice to follow.  No distress noted; no recommendations for changes to symptom management regimen.  PMT will shadow moving forward.    Code Status:    Code Status Orders  (From admission, onward)           Start     Ordered   08/14/22 1641  Do not attempt resuscitation (DNR)  Continuous       Question Answer Comment  If patient has no pulse and is not breathing Do Not Attempt Resuscitation   If patient has a pulse and/or is breathing: Medical Treatment Goals COMFORT MEASURES: Keep clean/warm/dry, use medication by any route; positioning, wound care and other measures to relieve pain/suffering; use oxygen, suction/manual treatment of airway obstruction for comfort; do not transfer unless for comfort needs.   Consent: Discussion documented in EHR or advanced directives reviewed      08/14/22 1640           Code Status History     Date Active Date Inactive Code Status Order ID Comments User Context   08/14/2022  1640 08/14/2022 1640 DNR [id number]  Theotis Burrow, NP Inpatient   08/14/2022 1640 08/14/2022 1640 DNR [id number]  Theotis Burrow, NP Inpatient   08/10/2022 1003 08/14/2022 1640 DNR [id number]  Chilton Greathouse, MD Inpatient   08/10/2022 0930 08/10/2022 1003 DNR [id number]  Chilton Greathouse, MD Inpatient   08/09/2022 1044 08/10/2022 0930 DNR [id number]  Chilton Greathouse, MD ED   08/09/2022 1006 08/09/2022 1043 Full Code [id number]  Chilton Greathouse, MD ED   05/07/2019 1917 05/14/2019 2239 Full Code [id number]  Zigmund Daniel., MD ED   11/20/2016 2343 11/23/2016 2206 Full Code [id number]  Gery Pray, MD Inpatient       Thank you for allowing the Palliative Medicine Team to assist in the care of this patient.   Morton Stall, NP  Please contact Palliative Medicine Team phone at [phone] for questions and concerns.

## 2022-08-24 DIAGNOSIS — F0390 Unspecified dementia without behavioral disturbance: Secondary | ICD-10-CM | POA: Insufficient documentation

## 2022-08-24 DIAGNOSIS — L8990 Pressure ulcer of unspecified site, unspecified stage: Secondary | ICD-10-CM | POA: Insufficient documentation

## 2022-08-24 MED ORDER — POLYETHYLENE GLYCOL 3350 17 G PO PACK: 17.0000 g | PACK | Freq: Every day | ORAL | 0 refills | Status: DC | PRN

## 2022-08-24 MED ORDER — MORPHINE SULFATE (CONCENTRATE) 10 MG/0.5ML PO SOLN: 5.0000 mg | ORAL | 0 refills | Status: DC | PRN

## 2022-08-24 MED ORDER — HALOPERIDOL 0.5 MG PO TABS: 0.5000 mg | ORAL_TABLET | ORAL | 0 refills | Status: AC | PRN

## 2022-08-24 MED ORDER — LORAZEPAM 2 MG/ML PO CONC: 1.0000 mg | ORAL | 0 refills | Status: DC | PRN

## 2022-08-24 NOTE — Plan of Care (Signed)
Patient has been waiting for long term care placement, with plans for hospice to follow there. PMT has been following for end of life care needs.  Notified via epic chat patient has a LTC bed and will be discharged today. Patient will be followed by [facility] hospice.   PMT will sign off at this time. Please reconsult if needs arise.

## 2022-08-24 NOTE — Progress Notes (Signed)
Patient being discharged via EMS to [facility]. IV removed and report called to Sha're at [facility].

## 2022-08-24 NOTE — TOC Transition Note (Signed)
Transition of Care [facility]) - CM/SW Discharge Note   Patient Details  Name: Marc Smith MRN: [id number] Date of Birth: Jul 23, 1945  Transition of [facility]) CM/SW Contact:  Allena Katz, LCSW Phone Number: 08/24/2022, 8:39 AM   Clinical Narrative:   Pt to discharge to [facility] in [place] today. LOG sent to [employer] [facility] hospice notified.  RN given number for report. Med necessity printed to the unit. DC summary sent.     Final next level of care: Skilled Nursing Facility Barriers to Discharge: Barriers Resolved   Patient Goals and CMS Choice   Choice offered to / list presented to : [facility] POA / Guardian  Discharge Placement                Patient chooses bed at:  [facility]) Patient to be transferred to facility by: [facility] Name of family member notified: nephew Patient and family notified of of transfer: 08/24/22  Discharge Plan and Services Additional resources added to the After Visit Summary for       Post Acute Care Choice:  (TBD)                               Social Determinants of Health (SDOH) Interventions SDOH Screenings   Food Insecurity: No Food Insecurity (07/22/2022)  Housing: Patient Unable To Answer (07/22/2022)  Transportation Needs: No Transportation Needs (07/22/2022)  Utilities: Not At Risk (07/22/2022)  Depression (PHQ2-9): Low Risk  (07/22/2022)  Tobacco Use: High Risk (08/09/2022)     Readmission Risk Interventions     No data to display

## 2022-08-24 NOTE — Discharge Summary (Addendum)
Physician Discharge Summary   Patient: Marc Smith MRN: [id number] DOB: Jul 15, 1945  Admit date:     08/09/2022  Discharge date: 08/24/22  Discharge Physician: Gabryel Files   PCP: [facility]   Recommendations at discharge:    Discharge to SNF with hospice  Discharge Diagnoses: Principal Problem:   Aspiration pneumonia (HCC) Active Problems:   Essential hypertension   Thrombocytosis   Age-related physical debility   Sepsis with acute hypoxic respiratory failure and septic shock (HCC)   NSTEMI (non-ST elevated myocardial infarction) (HCC)   Pressure injury of skin  Resolved Problems:   * No resolved hospital problems. *  Hospital Course: 77 y.o. 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   They presented from nursing home to the ED on 08/09/2022 with AMS x 1 days.   In the ED, it was found that they had sepsis with hypoxia and respiratory distress so intubated and admitted to ICU.  Significant findings included  lactic acid 1.1, BUN/creatinine 41/[id number], WBC 11.8, platelets 507. Urinalysis positive for infection. CT chest with airspace disease in the left lower lobe, patchy airspace opacities in the right lower lobe, thyroid nodule. CT head non acute.   They were initially treated with intubation, hydrocortisone, and antibiotics for PNA and UTI.   6/19: extubated. Continued on antibiotics for PNA and UTI 6/20- weaned to room air. Barium swallow study completed. SLP evaluated and due to high risk of aspiration, recommended keep NPO. Palliative consulted to discuss GOC.  6/21:stable. Remains NPO. Overnight, he had increased delirium requiring tele-sitter and mittens as well as haldol. He did not sustain injuries.  6/22: stable. No overnight events. Family meeting with palliative today. 6/23: further palliative and hospice discussions with family.  Patient states he is hungry. Another episode of hypoglycemia so increased fluid rate. Completed course of azithromycin. Transitioned to comfort care. Able to tolerate comfort feeds.  6/24: clinically stable on room air for discharge to LTC hospice when available. 6/26: Stable, awaiting LTC placement with hospice 7/1: Patient self discontinued Foley catheter yesterday.  Some blood noted from urethral meatus.  No signs of urinary retention.  Foley not replaced. 7/3: Seen and examined at the bedside.  Stable for discharge to skilled nursing facility with hospice      Assessment and Plan: Has remained on comfort measures and able to tolerate comfort feeds. Will be discharged to [facility] with hospice care provided by [facility] care hospice.         Consultants: Critical care Procedures performed: Intubation and mechanical ventilation Disposition: Skilled nursing facility Diet recommendation:  Discharge Diet Orders (From admission, onward)     Start     Ordered   08/24/22 0000  Diet - low sodium heart healthy        08/24/22 1107           Dysphagia type 1 dysphagia 1 diet and honey thick Liquid DISCHARGE MEDICATION: Allergies as of 08/24/2022   No Known Allergies      Medication List     STOP taking these medications    allopurinol 100 MG tablet Commonly known as: ZYLOPRIM   aluminum-magnesium hydroxide 200-200 MG/5ML suspension   amLODipine-atorvastatin 10-40 MG tablet Commonly known as: CADUET   aspirin 81 MG chewable tablet   carvedilol 6.25 MG tablet Commonly known as: COREG   hydroxyurea 500 MG capsule Commonly known as: HYDREA   loperamide 2 MG capsule Commonly known as:  IMODIUM   sertraline 50 MG tablet Commonly known as: ZOLOFT   Vitamin D (Ergocalciferol) 50000 units Caps       TAKE these medications    acetaminophen 325 MG tablet Commonly known as: TYLENOL Take 650 mg by mouth every 4 (four) hours as needed for headache, fever,  moderate pain or mild pain.   feeding supplement Liqd Take 237 mLs by mouth 2 (two) times daily between meals.   haloperidol 0.5 MG tablet Commonly known as: HALDOL Take 1 tablet (0.5 mg total) by mouth every 4 (four) hours as needed for up to 10 days for agitation (or delirium).   LORazepam 2 MG/ML concentrated solution Commonly known as: ATIVAN Place 0.5 mLs (1 mg total) under the tongue every 4 (four) hours as needed for anxiety.   magnesium hydroxide 400 MG/5ML suspension Commonly known as: MILK OF MAGNESIA Take 30 mLs by mouth daily as needed for mild constipation, indigestion, heartburn or moderate constipation.   morphine CONCENTRATE 10 MG/0.5ML Soln concentrated solution Take 0.25 mLs (5 mg total) by mouth every 2 (two) hours as needed for moderate pain (or dyspnea).   polyethylene glycol 17 g packet Commonly known as: MIRALAX / GLYCOLAX Take 17 g by mouth daily as needed for moderate constipation.        Discharge Exam: Filed Weights   08/13/22 0500 08/14/22 0425 08/15/22 0452  Weight: 74.8 kg 66 kg 69.1 kg    General: awake, alert, NAD. Falls asleep if not actively engaged in conversation HEENT: atraumatic, clear conjunctiva, anicteric sclera, MMM, hard of hearing Respiratory: normal respiratory effort. Stertor and rhonchi present Cardiovascular: quick capillary refill, normal S1/S2, RRR, no JVD, murmurs Nervous: A&O x1. no gross focal neurologic deficits, normal speech Extremities: moves all equally, no edema, low muscle tone Skin: dry, intact, normal temperature, normal color. No rashes, lesions or ulcers on exposed skin Psychiatry: normal mood, congruent affect Condition at discharge: stable  The results of significant diagnostics from this hospitalization (including imaging, microbiology, ancillary and laboratory) are listed below for reference.   Imaging Studies: DG Swallowing Func-Speech Pathology  Result Date: 08/11/2022 Table formatting from the  original result was not included. Modified Barium Swallow Study Patient Details Name: Marc Smith MRN: [id number] Date of Birth: 10-01-1945 Today's Date: 08/11/2022 HPI/PMH: HPI: 77 year old nursing home resident with history of hyperlipidemia, hypertension, depression, gout, prior aspiration, essential thrombocytosis (JAK2 positive, on hydroxyurea) presenting with altered mental status, hypoxic respiratory failure, fevers and sepsis. Intubated in ED d/t acute hypoxic respiratory failure secondary to aspiration pneumonia. Extubated this AM- now on 4L O2. CT Chest 08/09/22: "Large airspace opacity is seen in left lower lobe consistent with pneumonia. Multiple patchy airspace opacities are noted in right lower lobe most consistent with pneumonia." Head CT 08/09/22: "No evidence of acute intracranial abnormality. Severe chronic microvascular ischemic disease." Pt is currently NPO awaiting swallow eval. Clinical Impression: Clinical Impression: Pt presents with severe oropharyngeal dysphagia. Pharyngeal phase c/b reduced hyolaryngeal elevation/excursion, laryngeal vestibule closure and delayed swallow initiation to the pyriform sinuses. Stated deficits leading to aspiration of nectar and thin liquids during the swallow and aspiration of honey thick liquids before the swallow. Pt was inconsistently sensate to aspiration, with delayed, weak cough leading to ineffective airway clearance. Oral phase was disorganized for liquids, leading to anterior loss from the oral cavity and posterior loss to the pharynx with liquids. Liquid oral residue noted to spill to the pharynx leading to additional instance of aspiration subsequently during solid preparation. Oral phase for  solids was significantly prolonged, with eventual partial clearance and loss to the pharynx. Given risk for aspiration based on stated oropharyngeal impairments, observed aspiration across liquid consistencies, and overall health/deconditioning/hx of recurrent  PNA, no "safe" diet can be recommended at this time. Recommend NPO with medications administered alternatively. SLP will follow up with family for education for results of assessment and recommendations. MD and RN aware of recommendations. Factors that may increase risk of adverse event in presence of aspiration Rubye Oaks & Clearance Coots 2021): Factors that may increase risk of adverse event in presence of aspiration Rubye Oaks & Clearance Coots 2021): Poor general health and/or compromised immunity; Reduced cognitive function; Frail or deconditioned; Dependence for feeding and/or oral hygiene; Inadequate oral hygiene; Reduced saliva; Weak cough; Aspiration of thick, dense, and/or acidic materials Recommendations/Plan: Swallowing Evaluation Recommendations Swallowing Evaluation Recommendations Recommendations: NPO Treatment Plan Treatment Plan Treatment recommendations: Therapy as outlined in treatment plan below Follow-up recommendations: Follow physicians's recommendations for discharge plan and follow up therapies Functional status assessment: Patient has had a recent decline in their functional status and/or demonstrates limited ability to make significant improvements in function in a reasonable and predictable amount of time. Treatment frequency: Min 2x/week Treatment duration: 1 week Interventions: Aspiration precaution training; Patient/family education Recommendations Recommendations for follow up therapy are one component of a multi-disciplinary discharge planning process, led by the attending physician.  Recommendations may be updated based on patient status, additional functional criteria and insurance authorization. Assessment: Orofacial Exam: Orofacial Exam Oral Cavity: Oral Hygiene: Dried secretions (sticky, stringy) Oral Cavity - Dentition: Poor condition; Missing dentition Orofacial Anatomy: WFL Oral Motor/Sensory Function: WFL Anatomy: Anatomy: WFL Boluses Administered: Boluses Administered Boluses Administered: Thin  liquids (Level 0); Mildly thick liquids (Level 2, nectar thick); Moderately thick liquids (Level 3, honey thick); Puree; Solid  Oral Impairment Domain: Oral Impairment Domain Lip Closure: Escape progressing to mid-chin (x1 with thin liquid cup) Tongue control during bolus hold: Posterior escape of greater than half of bolus Bolus preparation/mastication: Disorganized chewing/mashing with solid pieces of bolus unchewed Bolus transport/lingual motion: Repetitive/disorganized tongue motion Oral residue: Residue collection on oral structures Location of oral residue : Tongue; Lateral sulci Initiation of pharyngeal swallow : Pyriform sinuses  Pharyngeal Impairment Domain: Pharyngeal Impairment Domain Soft palate elevation: No bolus between soft palate (SP)/pharyngeal wall (PW) Laryngeal elevation: Partial superior movement of thyroid cartilage/partial approximation of arytenoids to epiglottic petiole Anterior hyoid excursion: Partial anterior movement Epiglottic movement: Complete inversion Laryngeal vestibule closure: Incomplete, narrow column air/contrast in laryngeal vestibule Pharyngeal stripping wave : Present - complete Pharyngeal contraction (A/P view only): N/A Pharyngoesophageal segment opening: Complete distension and complete duration, no obstruction of flow Tongue base retraction: No contrast between tongue base and posterior pharyngeal wall (PPW) Pharyngeal residue: Complete pharyngeal clearance Location of pharyngeal residue: N/A  Esophageal Impairment Domain: Esophageal Impairment Domain Esophageal clearance upright position: Complete clearance, esophageal coating Pill: Pill Consistency administered: -- (N/A) Penetration/Aspiration Scale Score: Penetration/Aspiration Scale Score 1.  Material does not enter airway: Puree; Solid 7.  Material enters airway, passes BELOW cords and not ejected out despite cough attempt by patient: Mildly thick liquids (Level 2, nectar thick); Thin liquids (Level 0) 8.  Material  enters airway, passes BELOW cords without attempt by patient to eject out (silent aspiration) : Moderately thick liquids (Level 3, honey thick) Compensatory Strategies: Compensatory Strategies Compensatory strategies: No (unable to cognitively complete strategies)   General Information: Caregiver present: No  Diet Prior to this Study: Dysphagia 2 (finely chopped); Mildly thick liquids (Level 2, nectar thick)  Temperature : Normal (WBC 14.0 trending up)   Respiratory Status: WFL   Supplemental O2: None (Room air)   History of Recent Intubation: Yes  Behavior/Cognition: Cooperative; Alert; Distractible Self-Feeding Abilities: Dependent for feeding; Needs hand-over-hand assist for feeding Baseline vocal quality/speech: Normal Volitional Cough: Able to elicit (weak) Volitional Swallow: Unable to elicit No data recorded Goal Planning: Prognosis for improved oropharyngeal function: Guarded Barriers to Reach Goals: Cognitive deficits; Time post onset; Severity of deficits; Overall medical prognosis No data recorded Patient/Family Stated Goal: none stated Consulted and agree with results and recommendations: Nurse; Physician Pain: Pain Assessment Pain Assessment: No/denies pain Breathing: 0 Negative Vocalization: 0 Facial Expression: 0 Body Language: 0 Consolability: 0 PAINAD Score: 0 Facial Expression: 0 Body Movements: 0 Muscle Tension: 0 Compliance with ventilator (intubated pts.): 0 Vocalization (extubated pts.): N/A CPOT Total: 0 End of Session: Start Time:SLP Start Time (ACUTE ONLY): 0810 Stop Time: SLP Stop Time (ACUTE ONLY): 0900 Time Calculation:SLP Time Calculation (min) (ACUTE ONLY): 50 min Charges: SLP Evaluations $ SLP Speech Visit: 1 Visit SLP Evaluations $BSS Swallow: 1 Procedure $MBS Swallow: 1 Procedure SLP visit diagnosis: SLP Visit Diagnosis: Dysphagia, oropharyngeal phase (R13.12) Past Medical History: Past Medical History: Diagnosis Date  Depression   Gout   Per Patient's nephew  Hyperlipidemia    Patient's nephew  Hypertension  Past Surgical History: No past surgical history on file. [place] J Clapp 08/11/2022, 9:34 AM  CT CHEST ABDOMEN PELVIS W CONTRAST  Result Date: 08/09/2022 CLINICAL DATA:  Sepsis. EXAM: CT CHEST, ABDOMEN, AND PELVIS WITH CONTRAST TECHNIQUE: Multidetector CT imaging of the chest, abdomen and pelvis was performed following the standard protocol during bolus administration of intravenous contrast. RADIATION DOSE REDUCTION: This exam was performed according to the departmental dose-optimization program which includes automated exposure control, adjustment of the mA and/or kV according to patient size and/or use of iterative reconstruction technique. CONTRAST:  80mL OMNIPAQUE IOHEXOL 300 MG/ML  SOLN COMPARISON:  May 07, 2019. FINDINGS: CT CHEST FINDINGS Cardiovascular: Atherosclerosis of thoracic aorta is noted without aneurysm or dissection. Normal cardiac size. No pericardial effusion. Coronary artery calcifications are noted. Mediastinum/Nodes: Endotracheal tube is in grossly good position. Esophagus is unremarkable. No adenopathy is noted. 1.6 cm left thyroid nodule is noted. Lungs/Pleura: No pneumothorax or pleural effusion is noted. Large left lower lobe airspace opacity is noted concerning for pneumonia. Multiple patchy opacities are noted in right lower lobe concerning for pneumonia as well. Multiple small nodular opacities are noted in right upper lobe posteriorly most consistent with infection. Musculoskeletal: Old right rib fractures are noted. No acute osseous abnormality is noted. CT ABDOMEN PELVIS FINDINGS Hepatobiliary: No cholelithiasis or biliary dilatation is noted. Right hepatic cyst is noted. Pancreas: Unremarkable. No pancreatic ductal dilatation or surrounding inflammatory changes. Spleen: Normal in size without focal abnormality. Adrenals/Urinary Tract: Adrenal glands appear normal. Right renal cyst is noted for which no further follow-up is required. No  hydronephrosis or renal obstruction is noted. Urinary bladder is decompressed secondary to Foley catheter. Stomach/Bowel: Stomach is unremarkable. No small bowel dilatation is noted. The appendix is not clearly visualized. Large amount of stool seen in the distal sigmoid colon and rectum concerning for impaction. No bowel inflammation is noted. Vascular/Lymphatic: Aortic atherosclerosis. No enlarged abdominal or pelvic lymph nodes. Reproductive: Prostate is unremarkable. Other: No abdominal wall hernia or abnormality. No abdominopelvic ascites. Musculoskeletal: No acute or significant osseous findings. IMPRESSION: Large airspace opacity is seen in left lower lobe consistent with pneumonia. Multiple patchy airspace opacities are noted in  right lower lobe most consistent with pneumonia. Cluster of small nodules is noted posteriorly in the right lower lobe most likely representing atypical infection such as mycobacterium, or short-term follow-up chest CT in 2-3 weeks is recommended to ensure resolution or stability and rule out underlying malignancy. 1.6 cm left thyroid nodule. Recommend thyroid US. (Ref: J Am Coll Radiol. 2015 Feb;12(2): 143-50). Large amount of stool seen in distal sigmoid colon and rectum concerning for impaction. Coronary artery calcifications are noted suggesting coronary artery disease. Aortic Atherosclerosis (ICD10-I70.0). Electronically Signed   By: Lupita Raider M.D.   On: 08/09/2022 09:04   CT Head Wo Contrast  Result Date: 08/09/2022 CLINICAL DATA:  Mental status change, unknown cause EXAM: CT HEAD WITHOUT CONTRAST TECHNIQUE: Contiguous axial images were obtained from the base of the skull through the vertex without intravenous contrast. RADIATION DOSE REDUCTION: This exam was performed according to the departmental dose-optimization program which includes automated exposure control, adjustment of the mA and/or kV according to patient size and/or use of iterative reconstruction  technique. COMPARISON:  CT head 11/20/2016. FINDINGS: Brain: No evidence of acute infarction, hemorrhage, hydrocephalus, extra-axial collection or mass lesion/mass effect. Severe confluent white matter hypodensities, nonspecific but likely related to chronic microvascular ischemic disease. Vascular: No hyperdense vessel. Skull: No acute fracture. Sinuses/Orbits: Mild paranasal sinus mucosal thickening. No acute orbital findings. Other: No mastoid effusions. IMPRESSION: 1. No evidence of acute intracranial abnormality. 2. Severe chronic microvascular ischemic disease. Electronically Signed   By: Feliberto Harts M.D.   On: 08/09/2022 09:04   DG Chest Portable 1 View  Result Date: 08/09/2022 CLINICAL DATA:  Difficulty breathing, status post intubation EXAM: PORTABLE CHEST 1 VIEW COMPARISON:  Previous studies including the examination done earlier today FINDINGS: Transverse diameter of heart is within normal limits. Central pulmonary vessels are prominent. Tip of endotracheal tube is 5 cm above the carina. There is interval placement of enteric tube. Distal portion of enteric tube is coiled in the left lower lung field, possibly within a large hiatal hernia. Increased density is seen in left lower lung field obscuring the left hemidiaphragm suggesting atelectasis and possibly pleural effusion. IMPRESSION: Central pulmonary vessels are more prominent. Increased density is seen in left lower lung fields suggesting atelectasis/pneumonia and possibly pleural effusion. Distal portion of NG tube is noted overlying the left lower lung field, possibly within large hiatal hernia. Electronically Signed   By: Ernie Avena M.D.   On: 08/09/2022 08:18   DG Abdomen 1 View  Result Date: 08/09/2022 CLINICAL DATA:  Enteric tube placement EXAM: ABDOMEN - 1 VIEW COMPARISON:  05/08/2019 FINDINGS: Enteric tube is coiled within large hiatal hernia. There is increased density in left lower lung field. Bowel gas pattern is  nonspecific. Extensive arterial calcifications are seen. IMPRESSION: Distal portion of enteric tube is noted overlying the left lower lung field, most likely within a large hiatal hernia. Electronically Signed   By: Ernie Avena M.D.   On: 08/09/2022 08:14   DG Chest Port 1 View  Result Date: 08/09/2022 CLINICAL DATA:  Questionable sepsis EXAM: PORTABLE CHEST 1 VIEW COMPARISON:  02-08-12 FINDINGS: Chronic elevation of the left diaphragm. There is no edema, consolidation, effusion, or pneumothorax. Extensive artifact from EKG leads. Normal heart size and mediastinal contours. IMPRESSION: No convincing pneumonia. Electronically Signed   By: Tiburcio Pea M.D.   On: 08/09/2022 07:22    Microbiology: Results for orders placed or performed during the hospital encounter of 08/09/22  Blood Culture (routine x 2)  Status: None   Collection Time: 08/09/22  7:01 AM   Specimen: BLOOD  Result Value Ref Range Status   Specimen Description BLOOD LEFT ARM  Final   Special Requests   Final    BOTTLES DRAWN AEROBIC AND ANAEROBIC Blood Culture adequate volume   Culture   Final    NO GROWTH 5 DAYS Performed at [facility], [address], [place], [place] [address]    Report Status 08/14/2022 FINAL  Final  Blood Culture (routine x 2)     Status: None   Collection Time: 08/09/22  7:01 AM   Specimen: BLOOD  Result Value Ref Range Status   Specimen Description BLOOD RIGHT ARM  Final   Special Requests   Final    BOTTLES DRAWN AEROBIC AND ANAEROBIC Blood Culture adequate volume   Culture   Final    NO GROWTH 5 DAYS Performed at [facility], [address], [place], [place] [address]    Report Status 08/14/2022 FINAL  Final  Urine Culture     Status: Abnormal   Collection Time: 08/09/22  7:55 AM   Specimen: Urine, Random  Result Value Ref Range Status   Specimen Description   Final    URINE, RANDOM Performed at [facility], [address], [place],  [place] [address]    Special Requests   Final    URINE, CATHETERIZED Performed at [facility], [address] [place], [place] [address]    Culture >=100,000 COLONIES/mL ESCHERICHIA COLI (A)  Final   Report Status 08/11/2022 FINAL  Final   Organism ID, Bacteria ESCHERICHIA COLI (A)  Final      Susceptibility   Escherichia coli - MIC*    AMPICILLIN >=32 RESISTANT Resistant     CEFAZOLIN <=4 SENSITIVE Sensitive     CEFEPIME <=0.12 SENSITIVE Sensitive     CEFTRIAXONE <=0.25 SENSITIVE Sensitive     CIPROFLOXACIN <=0.25 SENSITIVE Sensitive     GENTAMICIN >=16 RESISTANT Resistant     IMIPENEM <=0.25 SENSITIVE Sensitive     NITROFURANTOIN <=16 SENSITIVE Sensitive     TRIMETH/SULFA >=320 RESISTANT Resistant     AMPICILLIN/SULBACTAM 16 INTERMEDIATE Intermediate     PIP/TAZO <=4 SENSITIVE Sensitive     * >=100,000 COLONIES/mL ESCHERICHIA COLI  SARS Coronavirus 2 by RT PCR (hospital order, performed in [facility] hospital lab) *cepheid single result test* Anterior Nasal Swab     Status: None   Collection Time: 08/09/22  9:03 AM   Specimen: Anterior Nasal Swab  Result Value Ref Range Status   SARS Coronavirus 2 by RT PCR NEGATIVE NEGATIVE Final    Comment: (NOTE) SARS-CoV-2 target nucleic acids are NOT DETECTED.  The SARS-CoV-2 RNA is generally detectable in upper and lower respiratory specimens during the acute phase of infection. The lowest concentration of SARS-CoV-2 viral copies this assay can detect is 250 copies / mL. A negative result does not preclude SARS-CoV-2 infection and should not be used as the sole basis for treatment or other patient management decisions.  A negative result may occur with improper specimen collection / handling, submission of specimen other than nasopharyngeal swab, presence of viral mutation(s) within the areas targeted by this assay, and inadequate number of viral copies (<250 copies / mL). A negative result must be combined with clinical observations,  patient history, and epidemiological information.  Fact Sheet for Patients:   RoadLapTop.co.za  Fact Sheet for Healthcare Providers: http://kim-miller.com/  This test is not yet approved or  cleared  by the [place] and has been authorized for detection and/or diagnosis of SARS-CoV-2 by FDA under an Emergency Use Authorization (EUA).  This EUA will remain in effect (meaning this test can be used) for the duration of the COVID-19 declaration under Section 564(b)(1) of the Act, 21 U.S.C. section 360bbb-3(b)(1), unless the authorization is terminated or revoked sooner.  Performed at [facility], [address] [place], [place] [address]   MRSA Next Gen by PCR, Nasal     Status: None   Collection Time: 08/09/22 11:33 AM   Specimen: Nasal Mucosa; Nasal Swab  Result Value Ref Range Status   MRSA by PCR Next Gen NOT DETECTED NOT DETECTED Final    Comment: (NOTE) The GeneXpert MRSA Assay (FDA approved for NASAL specimens only), is one component of a comprehensive MRSA colonization surveillance program. It is not intended to diagnose MRSA infection nor to guide or monitor treatment for MRSA infections. Test performance is not FDA approved in patients less than 39 years old. Performed at [facility], [address] [place], [place] [address]   Culture, blood (Routine X 2) w Reflex to ID Panel     Status: None   Collection Time: 08/09/22 11:42 AM   Specimen: BLOOD  Result Value Ref Range Status   Specimen Description BLOOD BLOOD LEFT HAND  Final   Special Requests   Final    BOTTLES DRAWN AEROBIC AND ANAEROBIC Blood Culture adequate volume   Culture   Final    NO GROWTH 5 DAYS Performed at [facility], [address], [place], [place] [address]    Report Status 08/14/2022 FINAL  Final  Culture, blood (Routine X 2) w Reflex to ID Panel     Status: None   Collection Time: 08/09/22 11:51 AM    Specimen: BLOOD  Result Value Ref Range Status   Specimen Description BLOOD BLOOD RIGHT HAND  Final   Special Requests   Final    BOTTLES DRAWN AEROBIC AND ANAEROBIC Blood Culture adequate volume   Culture   Final    NO GROWTH 5 DAYS Performed at [facility], [address], [place], [place] [address]    Report Status 08/14/2022 FINAL  Final    Labs: CBC: No results for input(s): "WBC", "NEUTROABS", "HGB", "HCT", "MCV", "PLT" in the last 168 hours. Basic Metabolic Panel: No results for input(s): "NA", "K", "CL", "CO2", "GLUCOSE", "BUN", "CREATININE", "CALCIUM", "MG", "PHOS" in the last 168 hours. Liver Function Tests: No results for input(s): "AST", "ALT", "ALKPHOS", "BILITOT", "PROT", "ALBUMIN" in the last 168 hours. CBG: No results for input(s): "GLUCAP" in the last 168 hours.  Discharge time spent: greater than 30 minutes.  Signed: Lucile Shutters, MD [facility] 08/24/2022

## 2022-09-26 ENCOUNTER — Other Ambulatory Visit: Payer: 59

## 2022-09-26 ENCOUNTER — Encounter: Payer: Self-pay | Admitting: Oncology

## 2022-09-26 ENCOUNTER — Inpatient Hospital Stay: Payer: 59 | Attending: Oncology

## 2022-09-26 ENCOUNTER — Ambulatory Visit: Payer: 59 | Admitting: Oncology

## 2022-09-26 ENCOUNTER — Inpatient Hospital Stay: Payer: 59 | Admitting: Oncology

## 2022-09-26 VITALS — BP 124/91 | HR 77 | Temp 99.2°F | Resp 18 | Ht 72.0 in | Wt 153.0 lb

## 2022-09-26 DIAGNOSIS — Z79899 Other long term (current) drug therapy: Secondary | ICD-10-CM | POA: Diagnosis not present

## 2022-09-26 DIAGNOSIS — D473 Essential (hemorrhagic) thrombocythemia: Secondary | ICD-10-CM

## 2022-09-26 DIAGNOSIS — D75839 Thrombocytosis, unspecified: Secondary | ICD-10-CM | POA: Diagnosis present

## 2022-09-26 DIAGNOSIS — F17210 Nicotine dependence, cigarettes, uncomplicated: Secondary | ICD-10-CM | POA: Insufficient documentation

## 2022-09-26 LAB — CBC WITH DIFFERENTIAL (CANCER CENTER ONLY)
Abs Immature Granulocytes: 0.12 10*3/uL — ABNORMAL HIGH (ref 0.00–0.07)
Basophils Absolute: 0.1 10*3/uL (ref 0.0–0.1)
Basophils Relative: 0 %
Eosinophils Absolute: 0 10*3/uL (ref 0.0–0.5)
Eosinophils Relative: 0 %
HCT: 39.3 % (ref 39.0–52.0)
Hemoglobin: 12.6 g/dL — ABNORMAL LOW (ref 13.0–17.0)
Immature Granulocytes: 1 %
Lymphocytes Relative: 9 %
Lymphs Abs: 2 10*3/uL (ref 0.7–4.0)
MCH: 27 pg (ref 26.0–34.0)
MCHC: 32.1 g/dL (ref 30.0–36.0)
MCV: 84.2 fL (ref 80.0–100.0)
Monocytes Absolute: 1.4 10*3/uL — ABNORMAL HIGH (ref 0.1–1.0)
Monocytes Relative: 6 %
Neutro Abs: 18.1 10*3/uL — ABNORMAL HIGH (ref 1.7–7.7)
Neutrophils Relative %: 84 %
Platelet Count: 1021 10*3/uL (ref 150–400)
RBC: 4.67 MIL/uL (ref 4.22–5.81)
RDW: 18.3 % — ABNORMAL HIGH (ref 11.5–15.5)
Smear Review: INCREASED
WBC Count: 21.7 10*3/uL — ABNORMAL HIGH (ref 4.0–10.5)
nRBC: 0 % (ref 0.0–0.2)

## 2022-09-26 MED ORDER — ASPIRIN 81 MG PO TBEC: 81.0000 mg | DELAYED_RELEASE_TABLET | Freq: Every day | ORAL | 5 refills | Status: DC

## 2022-09-26 MED ORDER — HYDROXYUREA 500 MG PO CAPS: ORAL_CAPSULE | ORAL | 2 refills | Status: DC

## 2022-09-26 NOTE — Addendum Note (Signed)
Addended by: Corene Cornea on: 09/26/2022 02:25 PM   Modules accepted: Orders

## 2022-09-26 NOTE — Progress Notes (Signed)
Hematology/Oncology Consult note 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 Cathie Hoops back in April 2021.  He was found to have thrombocytosis with a platelet count that has been gradually trending up from the 700s in 2021 presently to the 1000's.  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  Bone marrow biopsy was recommended but the patient never followed up and was never subsequently on Hydrea.  He is now a resident of nursing home and is here to reestablish follow-up for thrombocytosis.  In the interim it appears that the patient may have an episode of TIA but is not entirely clear.  No other episodes of DVT PE or strokes.  Patients caregiver reports that he occasionally coughs up blood   Interval history-patient was admitted to the hospital early July for aspiration pneumonia.  He was doing poorly overall and was eventually discharged with hospice services.  All his medications which she was taking prior to his hospitalization including antihypertensives hydroxyurea and aspirin were all discontinued.  We got in touch with his nursing home and we were informed that patient's family did not wish him to continue with hospice and wanted him to restart all his medications.  It appears that patient is not presently taking any hydroxyurea or aspirin  Patient is a poor historian and is unable to give any history  ECOG PS- 4 Pain scale- 0   Review of systems- Review of Systems  Unable to perform ROS: Mental acuity      No Known  Allergies   Past Medical History:  Diagnosis Date   Depression    Gout    Per Patient's nephew   Hyperlipidemia    Patient's nephew   Hypertension      History reviewed. No pertinent surgical history.  Social History   Socioeconomic History   Marital status: Single    Spouse name: Not on file   Number of children: Not on file   Years of education: Not on file   Highest education level: Not on file  Occupational History   Not on file  Tobacco Use   Smoking status: Every Day    Current packs/day: 1.00    Types: Cigarettes   Smokeless tobacco: Current  Substance and Sexual Activity   Alcohol use: No   Drug use: No   Sexual activity: Not on file  Other Topics Concern   Not on file  Social History Narrative   Not on file   Social Determinants of Health   Financial Resource Strain: Low Risk  (07/12/2022)   Received from [facility], [facility]   Overall Financial Resource Strain (CARDIA)    Difficulty of Paying Living Expenses: Not hard at all  Food Insecurity: No Food Insecurity (07/22/2022)   Hunger Vital Sign    Worried About Running Out of Food in the Last Year: Never true    Ran Out of Food in the Last Year: Never true  Transportation Needs: No Transportation Needs (07/22/2022)   PRAPARE - Transportation    Lack of Transportation (Medical): No    Lack  of Transportation (Non-Medical): No  Physical Activity: Not on file  Stress: Not on file  Social Connections: Not on file  Intimate Partner Violence: Not At Risk (07/22/2022)   Humiliation, Afraid, Rape, and Kick questionnaire    Fear of Current or Ex-Partner: No    Emotionally Abused: No    Physically Abused: No    Sexually Abused: No    History reviewed. No pertinent family history.   Current Outpatient Medications:    acetaminophen (TYLENOL) 325 MG tablet, Take 650 mg by mouth every 4 (four) hours as needed for headache, fever, moderate pain or mild pain., Disp: , Rfl:    feeding supplement,  ENSURE ENLIVE, (ENSURE ENLIVE) LIQD, Take 237 mLs by mouth 2 (two) times daily between meals., Disp: 237 mL, Rfl: 12   mirtazapine (REMERON) 7.5 MG tablet, Take 7.5 mg by mouth at bedtime., Disp: , Rfl:    Morphine Sulfate (MORPHINE CONCENTRATE) 10 MG/0.5ML SOLN concentrated solution, Take 0.25 mLs (5 mg total) by mouth every 2 (two) hours as needed for moderate pain (or dyspnea)., Disp: 180 mL, Rfl: 0   polyethylene glycol (MIRALAX / GLYCOLAX) 17 g packet, Take 17 g by mouth daily as needed for moderate constipation., Disp: 14 each, Rfl: 0   haloperidol (HALDOL) 0.5 MG tablet, Take 1 tablet (0.5 mg total) by mouth every 4 (four) hours as needed for up to 10 days for agitation (or delirium). (Patient not taking: Reported on 09/26/2022), Disp: 20 tablet, Rfl: 0   LORazepam (ATIVAN) 2 MG/ML concentrated solution, Place 0.5 mLs (1 mg total) under the tongue every 4 (four) hours as needed for anxiety., Disp: 30 mL, Rfl: 0   magnesium hydroxide (MILK OF MAGNESIA) 400 MG/5ML suspension, Take 30 mLs by mouth daily as needed for mild constipation, indigestion, heartburn or moderate constipation. (Patient not taking: Reported on 09/26/2022), Disp: , Rfl:   Physical exam:  Vitals:   09/26/22 1033  BP: (!) 124/91  Pulse: 77  Resp: 18  Temp: 99.2 F (37.3 C)  TempSrc: Tympanic  SpO2: 92%  Weight: 153 lb (69.4 kg)  Height: 6' (1.829 m)   Physical Exam Constitutional:      Comments: He is awake but does not responds appropriately to medical commands.  Does not respond verbally  Cardiovascular:     Rate and Rhythm: Normal rate and regular rhythm.     Heart sounds: Normal heart sounds.  Pulmonary:     Effort: Pulmonary effort is normal.     Breath sounds: Normal breath sounds.  Abdominal:     General: Bowel sounds are normal.     Palpations: Abdomen is soft.  Skin:    General: Skin is warm and dry.  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  No results found.   Assessment and plan- Patient is a 77 y.o. 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  He also had von Willebrand panel done which did not show any abnormality and patient was asked to take aspirin.  Both aspirin and hydroxyurea were discontinued during his recent hospitalization when he was discharged with hospice.  Upon inquiring with the nursing facility it appears that patient is not presently under hospice care and patient's family wanted aggressive treatments to be restarted.  From my standpoint his platelet counts are back up to 1021 because of not taking Hydrea and I would like him to restart Hydrea 500 mg 3 times a week and 1000 mg 4 times a week along with aspirin 81 mg.  We will continue to monitor her CBC monthly and I will see him back in 3 months.  Patient was previously on antihypertensives and other medications as well which was stopped during his hospitalization but if patient is not on hospice does need to be restarted and we have informed the nursing facility that they need to get in touch with his primary care provider about the same   Visit Diagnosis 1. Essential thrombocytosis (HCC)   2. High risk medication use      Dr. Owens Shark, MD, MPH [facility] [phone] 09/26/2022 11:08 AM

## 2022-10-13 ENCOUNTER — Emergency Department (HOSPITAL_COMMUNITY)
Admission: EM | Admit: 2022-10-13 | Discharge: 2022-10-14 | Disposition: A | Discharge status: Home / Self Care | Payer: 59 | Attending: Emergency Medicine | Admitting: Emergency Medicine

## 2022-10-13 ENCOUNTER — Other Ambulatory Visit: Payer: Self-pay

## 2022-10-13 ENCOUNTER — Emergency Department (HOSPITAL_COMMUNITY): Payer: 59

## 2022-10-13 DIAGNOSIS — R930 Abnormal findings on diagnostic imaging of skull and head, not elsewhere classified: Secondary | ICD-10-CM | POA: Insufficient documentation

## 2022-10-13 DIAGNOSIS — I10 Essential (primary) hypertension: Secondary | ICD-10-CM | POA: Diagnosis not present

## 2022-10-13 DIAGNOSIS — Z7982 Long term (current) use of aspirin: Secondary | ICD-10-CM | POA: Diagnosis not present

## 2022-10-13 DIAGNOSIS — E870 Hyperosmolality and hypernatremia: Secondary | ICD-10-CM | POA: Diagnosis not present

## 2022-10-13 DIAGNOSIS — N179 Acute kidney failure, unspecified: Secondary | ICD-10-CM | POA: Diagnosis present

## 2022-10-13 DIAGNOSIS — Y92009 Unspecified place in unspecified non-institutional (private) residence as the place of occurrence of the external cause: Secondary | ICD-10-CM

## 2022-10-13 DIAGNOSIS — D72829 Elevated white blood cell count, unspecified: Secondary | ICD-10-CM | POA: Diagnosis not present

## 2022-10-13 DIAGNOSIS — W19XXXA Unspecified fall, initial encounter: Secondary | ICD-10-CM

## 2022-10-13 DIAGNOSIS — Z79899 Other long term (current) drug therapy: Secondary | ICD-10-CM | POA: Insufficient documentation

## 2022-10-13 DIAGNOSIS — J189 Pneumonia, unspecified organism: Secondary | ICD-10-CM

## 2022-10-13 DIAGNOSIS — J181 Lobar pneumonia, unspecified organism: Secondary | ICD-10-CM | POA: Insufficient documentation

## 2022-10-13 LAB — CBC WITH DIFFERENTIAL/PLATELET
Abs Immature Granulocytes: 0.07 10*3/uL (ref 0.00–0.07)
Basophils Absolute: 0.1 10*3/uL (ref 0.0–0.1)
Basophils Relative: 1 %
Eosinophils Absolute: 0 10*3/uL (ref 0.0–0.5)
Eosinophils Relative: 0 %
HCT: 45.1 % (ref 39.0–52.0)
Hemoglobin: 13.4 g/dL (ref 13.0–17.0)
Immature Granulocytes: 1 %
Lymphocytes Relative: 3 %
Lymphs Abs: 0.4 10*3/uL — ABNORMAL LOW (ref 0.7–4.0)
MCH: 26.3 pg (ref 26.0–34.0)
MCHC: 29.7 g/dL — ABNORMAL LOW (ref 30.0–36.0)
MCV: 88.6 fL (ref 80.0–100.0)
Monocytes Absolute: 0.1 10*3/uL (ref 0.1–1.0)
Monocytes Relative: 1 %
Neutro Abs: 14.8 10*3/uL — ABNORMAL HIGH (ref 1.7–7.7)
Neutrophils Relative %: 94 %
Platelets: 662 10*3/uL — ABNORMAL HIGH (ref 150–400)
RBC: 5.09 MIL/uL (ref 4.22–5.81)
RDW: 19.5 % — ABNORMAL HIGH (ref 11.5–15.5)
WBC Morphology: INCREASED
WBC: 15.5 10*3/uL — ABNORMAL HIGH (ref 4.0–10.5)
nRBC: 0 % (ref 0.0–0.2)

## 2022-10-13 LAB — COMPREHENSIVE METABOLIC PANEL
ALT: 27 U/L (ref 0–44)
AST: 50 U/L — ABNORMAL HIGH (ref 15–41)
Albumin: 3 g/dL — ABNORMAL LOW (ref 3.5–5.0)
Alkaline Phosphatase: 78 U/L (ref 38–126)
Anion gap: 11 (ref 5–15)
BUN: 80 mg/dL — ABNORMAL HIGH (ref 8–23)
CO2: 25 mmol/L (ref 22–32)
Calcium: 9.1 mg/dL (ref 8.9–10.3)
Chloride: 126 mmol/L — ABNORMAL HIGH (ref 98–111)
Creatinine, Ser: 2.45 mg/dL — ABNORMAL HIGH (ref 0.61–1.24)
GFR, Estimated: 27 mL/min — ABNORMAL LOW (ref >60)
Glucose, Bld: 89 mg/dL (ref 70–99)
Potassium: 5.6 mmol/L — ABNORMAL HIGH (ref 3.5–5.1)
Sodium: 162 mmol/L (ref 135–145)
Total Bilirubin: 0.8 mg/dL (ref 0.3–1.2)
Total Protein: 7.8 g/dL (ref 6.5–8.1)

## 2022-10-13 LAB — CK: Total CK: 538 U/L — ABNORMAL HIGH (ref 49–397)

## 2022-10-13 NOTE — ED Triage Notes (Signed)
Pt BIB EMS from [facility]. Pt was found on the floor, pt is bedbound and non-verbal. MD at facility wanted pt transferred to ED for a CT scan. Pt is not on blood thinners.

## 2022-10-13 NOTE — Discharge Instructions (Addendum)
Thank you for coming to [facility] Emergency Department. [name] was seen for fall. We did an exam, labs, and imaging, and these showed right-sided pneumonia, high sodium, acute kidney injury. We called and discussed with patient's nephew and reviewed the documentation at bedside and confirmed that [name] did not want treatment with antibiotics or IV fluids. Confirmed that [name] would not want to be admitted or treated for these findings and instead is comfort care. Therefore [name] will be sent back to his facility. Please follow up with the doctor at the facility.

## 2022-10-13 NOTE — ED Provider Notes (Signed)
Marc Smith   CSN: [id number] Arrival date & time: 10/13/22  1849     History  Chief Complaint  Patient presents with   Marc Smith    Marc Smith is a 77 y.o. male with PMH as listed below who presents BIB EMS from [facility]. Pt was found on the floor, pt is bedbound and non-verbal. MD at facility wanted pt transferred to ED for a CT scan. Pt is not on blood thinners. MOST form at bedside clearly states that patient is comfort care, and states not to give antibiotics or IV fluids. Patient is nonverbal, cannot respond to questions. He is HDS on arrival to ED. Level 5 caveat.   Past Medical History:  Diagnosis Date   Depression    Gout    Per Patient's nephew   Hyperlipidemia    Patient's nephew   Hypertension        Home Medications Prior to Admission medications   Medication Sig Start Date End Date Taking? Authorizing Provider  acetaminophen (TYLENOL) 325 MG tablet Take 650 mg by mouth every 4 (four) hours as needed for headache, fever, moderate pain or mild pain.    [provider]  aspirin EC 81 MG tablet Take 1 tablet (81 mg total) by mouth daily. Swallow whole. 09/26/22   Creig Hines, MD  feeding supplement, ENSURE ENLIVE, (ENSURE ENLIVE) LIQD Take 237 mLs by mouth 2 (two) times daily between meals. 05/14/19   Darlin Priestly, MD  haloperidol (HALDOL) 0.5 MG tablet Take 1 tablet (0.5 mg total) by mouth every 4 (four) hours as needed for up to 10 days for agitation (or delirium). Patient not taking: Reported on 09/26/2022 08/24/22 09/03/22  Lucile Shutters, MD  hydroxyurea (HYDREA) 500 MG capsule May take with food to minimize GI side effects.pt needs hydrea 500 mg three times a day and the other 4 days are hydrea 500 mg two times a day. Continue this weekly til Dr. Smith [name] needs to change it 09/26/22   Creig Hines, MD  LORazepam (ATIVAN) 2 MG/ML concentrated solution Place 0.5 mLs (1 mg total) under the tongue every  4 (four) hours as needed for anxiety. 08/24/22   Lucile Shutters, MD  magnesium hydroxide (MILK OF MAGNESIA) 400 MG/5ML suspension Take 30 mLs by mouth daily as needed for mild constipation, indigestion, heartburn or moderate constipation. Patient not taking: Reported on 09/26/2022    [provider]  mirtazapine (REMERON) 7.5 MG tablet Take 7.5 mg by mouth at bedtime.    [provider]  Morphine Sulfate (MORPHINE CONCENTRATE) 10 MG/0.5ML SOLN concentrated solution Take 0.25 mLs (5 mg total) by mouth every 2 (two) hours as needed for moderate pain (or dyspnea). 08/24/22   Agbata, Tochukwu, MD  polyethylene glycol (MIRALAX / GLYCOLAX) 17 g packet Take 17 g by mouth daily as needed for moderate constipation. 08/24/22   Lucile Shutters, MD      Allergies    Patient has no known allergies.    Review of Systems   Review of Systems A 10 point review of systems was performed and is negative unless otherwise reported in HPI.  Physical Exam Updated Vital Signs BP 117/87   Pulse 93   Temp 98 F (36.7 C) (Axillary)   Resp 16   Ht 6' (1.829 m)   Wt 59 kg   SpO2 97%   BMI 17.63 kg/m  Physical Exam General: Chronically ill-appearing cachectic male, lying in bed.  HEENT: PERRLA, Sclera anicteric, dry mucous membranes, trachea midline.  Cardiology: RRR, no murmurs/rubs/gallops.   Resp: Normal respiratory rate and effort. CTAB, no wheezes, rhonchi, crackles.  Abd: Soft, non-tender, non-distended. No rebound tenderness or guarding.  GU: Deferred. MSK: No peripheral edema or signs of trauma.  Skin: warm, dry.  Back: No midline C spine tenderness or step-offs Neuro: Nonverbal, does not respond to questions or follow commands, CNs II-XII grossly intact. MAEs.   ED Results / Procedures / Treatments   Labs (all labs ordered are listed, but only abnormal results are displayed) Labs Reviewed  CBC WITH DIFFERENTIAL/PLATELET - Abnormal; Notable for the following components:      Result  Value   WBC 15.5 (*)    MCHC 29.7 (*)    RDW 19.5 (*)    Platelets 662 (*)    Neutro Abs 14.8 (*)    Lymphs Abs 0.4 (*)    All other components within normal limits  COMPREHENSIVE METABOLIC PANEL - Abnormal; Notable for the following components:   Sodium 162 (*)    Potassium 5.6 (*)    Chloride 126 (*)    BUN 80 (*)    Creatinine, Ser 2.45 (*)    Albumin 3.0 (*)    AST 50 (*)    GFR, Estimated 27 (*)    All other components within normal limits  CK - Abnormal; Notable for the following components:   Total CK 538 (*)    All other components within normal limits    EKG EKG Interpretation Date/Time:  Thursday October 13 2022 22:09:28 EDT Ventricular Rate:  106 PR Interval:    QRS Duration:  122 QT Interval:  377 QTC Calculation: 469 R Axis:   65  Text Interpretation: Baseline artifact too severe to interpret EKG Confirmed by Vivi Barrack [phone]) on 10/13/2022 10:35:38 PM  Radiology CT Head Wo Contrast  Result Date: 10/13/2022 CLINICAL DATA:  Found down, nonverbal EXAM: CT HEAD WITHOUT CONTRAST TECHNIQUE: Contiguous axial images were obtained from the base of the skull through the vertex without intravenous contrast. RADIATION DOSE REDUCTION: This exam was performed according to the departmental dose-optimization program which includes automated exposure control, adjustment of the mA and/or kV according to patient size and/or use of iterative reconstruction technique. COMPARISON:  08/09/2022 FINDINGS: Brain: No evidence of acute infarction, hemorrhage, mass, mass effect, or midline shift. No hydrocephalus or extra-axial fluid collection. Periventricular white matter changes, likely the sequela of chronic small vessel ischemic disease. Advanced cerebral atrophy for age. Vascular: No hyperdense vessel. Atherosclerotic calcifications in the intracranial carotid and vertebral arteries. Skull: Negative for fracture or focal lesion. Sinuses/Orbits: Motion limited, but no acute finding.  Other: The mastoid air cells are well aerated. IMPRESSION: No acute intracranial process. Electronically Signed   By: Wiliam Ke M.D.   On: 10/13/2022 20:45   DG Chest Port 1 View  Result Date: 10/13/2022 CLINICAL DATA:  Patient found on the floor. EXAM: PORTABLE CHEST 1 VIEW COMPARISON:  August 09, 2022 FINDINGS: The study is limited secondary to patient rotation. The heart size and mediastinal contours are within normal limits. There is marked severity calcification of the thoracic aorta. Moderate severity patchy infiltrate is seen within the right lung base. Very mild left basilar atelectasis is also noted. No pleural effusion or pneumothorax is identified. Multiple chronic right-sided rib fractures are noted. IMPRESSION: 1. Moderate severity right basilar infiltrate. 2. Very mild left basilar atelectasis. Electronically Signed   By: Aram Candela M.D.   On:  10/13/2022 20:42   DG Pelvis 1-2 Views  Result Date: 10/13/2022 CLINICAL DATA:  Patient found on the floor. EXAM: PELVIS - 1-2 VIEW COMPARISON:  August 09, 2022 FINDINGS: There is no evidence of an acute pelvic fracture or diastasis, however, this is markedly limited in evaluation secondary to difficult patient positioning. Mild to moderate severity degenerative changes seen involving both hips, in the form of joint space narrowing and acetabular sclerosis. Moderate to marked severity vascular calcification is seen. IMPRESSION: Markedly limited study without evidence of an acute pelvic fracture or diastasis. CT correlation is recommended if there is continued clinical concern for an acute pelvic fracture. Electronically Signed   By: Aram Candela M.D.   On: 10/13/2022 20:40    Procedures Procedures    Medications Ordered in ED Medications - No data to display  ED Course/ Medical Decision Making/ A&P                          Medical Decision Making Amount and/or Complexity of Data Reviewed Labs: ordered. Radiology: ordered.  Decision-making details documented in ED Course.    MDM:    Patient with fall at home. CTH NAICP, pelvis XR without acute fracture. No traumatic injuries noted on thorax or abdomen/back. CXR does demonstrate pneumonia which likely contributed to his fall. +Leukocytosis and CMP demonstrates AKI and hypernatremia. He has dry mucous membranes, is likely very volume down.  Clinical Course as of 10/13/22 2315  Thu Oct 13, 2022  2127 DG Pelvis 1-2 Views Markedly limited study without evidence of an acute pelvic fracture or diastasis. CT correlation is recommended if there is continued clinical concern for an acute pelvic fracture.   [HN]  2128 DG Chest Port 1 View 1. Moderate severity right basilar infiltrate. 2. Very mild left basilar atelectasis.   [HN]  2128 CXR w/ moderate severity R basilar infiltrate. MOST form at bedside very clearly documents instructions against antibiotics. Will not treat with antibiotics. [HN]  2231 CT Head Wo Contrast CTH with NAICP [HN]  2231 Attempted to contact patient's nephew Yoandry Mounger, did not answer and unable to leave voicemail. [HN]  2232 Reached Trudee Grip, patient's niece. Patient's nephew was informed by facility that he had fallen. Discussed patient's labs and pneumonia on chest xray. Confirmed that patient does not want treatment with IV fluids or antibiotics as is documented on MOST form. Patient is comfort care per his MOST form. Unclear why he was sent here by his facility. Will be discharged with discharge instructions/return precautions. Discussed with patient's nephew who is in agreement.  [HN]    Clinical Course User Index [HN] Loetta Rough, MD    Labs: I Ordered, and personally interpreted labs.  The pertinent results include:  those listed above  Imaging Studies ordered: I ordered imaging studies including CTH, CXR, PXR I independently visualized and interpreted imaging. I agree with the radiologist  interpretation  Additional history obtained from chart review, nephew and niece, paperwork at bedside.    Social Determinants of Health: Lives at [facility]  Disposition:  DC to facility  Co morbidities that complicate the patient evaluation  Past Medical History:  Diagnosis Date   Depression    Gout    Per Patient's nephew   Hyperlipidemia    Patient's nephew   Hypertension      Medicines No orders of the defined types were placed in this encounter.   I have reviewed the patients home medicines and  have made adjustments as needed  Problem List / ED Course: Problem List Items Addressed This Visit   None Visit Diagnoses     Fall in home, initial encounter    -  Primary   Hypernatremia       AKI (acute kidney injury) (HCC)       Pneumonia of right lower lobe due to infectious organism                       This Smith was created using dictation software, which may contain spelling or grammatical errors.    Loetta Rough, MD 10/13/22 [phone]

## 2022-10-23 DEATH — deceased

## 2022-12-27 ENCOUNTER — Inpatient Hospital Stay: Payer: 59 | Attending: Oncology

## 2022-12-27 ENCOUNTER — Inpatient Hospital Stay: Payer: 59 | Admitting: Oncology
